# Patient Record
Sex: Female | Born: 1940 | Race: White | Hispanic: No | Smoking: Former smoker
Health system: Southern US, Community
[De-identification: ages and names within clinical notes are randomized; demographics above are authoritative.]

## PROBLEM LIST (undated history)

## (undated) DIAGNOSIS — E079 Disorder of thyroid, unspecified: Secondary | ICD-10-CM

## (undated) DIAGNOSIS — M810 Age-related osteoporosis without current pathological fracture: Secondary | ICD-10-CM

## (undated) DIAGNOSIS — L03319 Cellulitis of trunk, unspecified: Secondary | ICD-10-CM

## (undated) DIAGNOSIS — M1990 Unspecified osteoarthritis, unspecified site: Secondary | ICD-10-CM

## (undated) DIAGNOSIS — L02219 Cutaneous abscess of trunk, unspecified: Secondary | ICD-10-CM

## (undated) DIAGNOSIS — Z853 Personal history of malignant neoplasm of breast: Secondary | ICD-10-CM

## (undated) DIAGNOSIS — E119 Type 2 diabetes mellitus without complications: Secondary | ICD-10-CM

## (undated) DIAGNOSIS — C50919 Malignant neoplasm of unspecified site of unspecified female breast: Secondary | ICD-10-CM

## (undated) DIAGNOSIS — J449 Chronic obstructive pulmonary disease, unspecified: Secondary | ICD-10-CM

## (undated) DIAGNOSIS — C50419 Malignant neoplasm of upper-outer quadrant of unspecified female breast: Secondary | ICD-10-CM

## (undated) DIAGNOSIS — IMO0001 Reserved for inherently not codable concepts without codable children: Secondary | ICD-10-CM

## (undated) DIAGNOSIS — I10 Essential (primary) hypertension: Secondary | ICD-10-CM

## (undated) DIAGNOSIS — N189 Chronic kidney disease, unspecified: Secondary | ICD-10-CM

## (undated) DIAGNOSIS — C3410 Malignant neoplasm of upper lobe, unspecified bronchus or lung: Secondary | ICD-10-CM

## (undated) HISTORY — DX: Type 2 diabetes mellitus without complications: E11.9

## (undated) HISTORY — DX: Unspecified osteoarthritis, unspecified site: M19.90

## (undated) HISTORY — DX: Cutaneous abscess of trunk, unspecified: L02.219

## (undated) HISTORY — DX: Malignant neoplasm of upper-outer quadrant of unspecified female breast: C50.419

## (undated) HISTORY — DX: Essential (primary) hypertension: I10

## (undated) HISTORY — DX: Age-related osteoporosis without current pathological fracture: M81.0

## (undated) HISTORY — PX: ABDOMINAL HYSTERECTOMY: SHX81

## (undated) HISTORY — DX: Personal history of malignant neoplasm of breast: Z85.3

## (undated) HISTORY — DX: Malignant neoplasm of upper lobe, unspecified bronchus or lung: C34.10

## (undated) HISTORY — PX: APPENDECTOMY: SHX54

## (undated) HISTORY — DX: Cellulitis of trunk, unspecified: L03.319

## (undated) HISTORY — DX: Disorder of thyroid, unspecified: E07.9

---

## 2004-04-27 ENCOUNTER — Ambulatory Visit: Payer: Self-pay | Admitting: Internal Medicine

## 2004-04-29 ENCOUNTER — Ambulatory Visit: Payer: Self-pay | Admitting: Internal Medicine

## 2004-05-30 ENCOUNTER — Ambulatory Visit: Payer: Self-pay | Admitting: Internal Medicine

## 2004-06-29 ENCOUNTER — Ambulatory Visit: Payer: Self-pay | Admitting: Internal Medicine

## 2004-09-07 ENCOUNTER — Ambulatory Visit: Payer: Self-pay | Admitting: Internal Medicine

## 2004-10-16 ENCOUNTER — Inpatient Hospital Stay: Payer: Self-pay | Admitting: Internal Medicine

## 2004-10-16 ENCOUNTER — Ambulatory Visit: Payer: Self-pay | Admitting: Internal Medicine

## 2005-02-10 ENCOUNTER — Emergency Department: Payer: Self-pay | Admitting: Emergency Medicine

## 2005-07-08 ENCOUNTER — Inpatient Hospital Stay: Payer: Self-pay | Admitting: Internal Medicine

## 2005-10-21 ENCOUNTER — Ambulatory Visit: Payer: Self-pay | Admitting: Internal Medicine

## 2005-10-26 ENCOUNTER — Ambulatory Visit: Payer: Self-pay | Admitting: Internal Medicine

## 2006-03-01 HISTORY — PX: BREAST CYST ASPIRATION: SHX578

## 2006-04-29 ENCOUNTER — Ambulatory Visit: Payer: Self-pay | Admitting: Internal Medicine

## 2006-05-03 ENCOUNTER — Ambulatory Visit: Payer: Self-pay | Admitting: Internal Medicine

## 2006-11-03 ENCOUNTER — Ambulatory Visit: Payer: Self-pay | Admitting: General Surgery

## 2007-11-29 ENCOUNTER — Ambulatory Visit: Payer: Self-pay | Admitting: Internal Medicine

## 2008-03-01 HISTORY — PX: BREAST BIOPSY: SHX20

## 2008-03-04 ENCOUNTER — Ambulatory Visit: Payer: Self-pay | Admitting: Internal Medicine

## 2008-04-01 ENCOUNTER — Ambulatory Visit: Payer: Self-pay | Admitting: Internal Medicine

## 2008-12-30 ENCOUNTER — Ambulatory Visit: Payer: Self-pay | Admitting: Internal Medicine

## 2009-01-20 ENCOUNTER — Ambulatory Visit: Payer: Self-pay | Admitting: Internal Medicine

## 2009-08-20 ENCOUNTER — Ambulatory Visit: Payer: Self-pay | Admitting: General Surgery

## 2009-12-26 ENCOUNTER — Emergency Department: Payer: Self-pay | Admitting: Emergency Medicine

## 2010-03-01 DIAGNOSIS — C50919 Malignant neoplasm of unspecified site of unspecified female breast: Secondary | ICD-10-CM

## 2010-03-01 HISTORY — PX: OTHER SURGICAL HISTORY: SHX169

## 2010-03-01 HISTORY — PX: BREAST BIOPSY: SHX20

## 2010-03-01 HISTORY — DX: Malignant neoplasm of unspecified site of unspecified female breast: C50.919

## 2010-05-12 ENCOUNTER — Inpatient Hospital Stay: Payer: Self-pay | Admitting: Internal Medicine

## 2010-12-29 ENCOUNTER — Ambulatory Visit: Payer: Self-pay | Admitting: Internal Medicine

## 2010-12-31 ENCOUNTER — Ambulatory Visit: Payer: Self-pay | Admitting: Internal Medicine

## 2011-01-11 HISTORY — PX: BREAST SURGERY: SHX581

## 2011-01-19 ENCOUNTER — Ambulatory Visit: Payer: Self-pay | Admitting: General Surgery

## 2011-01-25 ENCOUNTER — Ambulatory Visit: Payer: Self-pay | Admitting: General Surgery

## 2011-01-25 DIAGNOSIS — C50419 Malignant neoplasm of upper-outer quadrant of unspecified female breast: Secondary | ICD-10-CM

## 2011-01-25 HISTORY — DX: Malignant neoplasm of upper-outer quadrant of unspecified female breast: C50.419

## 2011-01-29 ENCOUNTER — Ambulatory Visit: Payer: Self-pay | Admitting: Radiation Oncology

## 2011-02-08 ENCOUNTER — Ambulatory Visit: Payer: Self-pay | Admitting: Radiation Oncology

## 2011-02-10 LAB — PATHOLOGY REPORT

## 2011-03-02 ENCOUNTER — Ambulatory Visit: Payer: Self-pay | Admitting: Radiation Oncology

## 2011-03-17 ENCOUNTER — Ambulatory Visit: Payer: Self-pay | Admitting: General Surgery

## 2011-03-29 ENCOUNTER — Ambulatory Visit: Payer: Self-pay | Admitting: Radiation Oncology

## 2011-04-02 ENCOUNTER — Ambulatory Visit: Payer: Self-pay | Admitting: Radiation Oncology

## 2011-07-29 ENCOUNTER — Ambulatory Visit: Payer: Self-pay | Admitting: Radiation Oncology

## 2011-07-31 ENCOUNTER — Ambulatory Visit: Payer: Self-pay | Admitting: Radiation Oncology

## 2011-08-12 ENCOUNTER — Ambulatory Visit: Payer: Self-pay | Admitting: General Surgery

## 2011-09-28 ENCOUNTER — Emergency Department: Payer: Self-pay | Admitting: Emergency Medicine

## 2011-09-28 LAB — CBC WITH DIFFERENTIAL/PLATELET
Basophil #: 0 10*3/uL (ref 0.0–0.1)
Eosinophil #: 0.1 10*3/uL (ref 0.0–0.7)
HCT: 42.1 % (ref 35.0–47.0)
Lymphocyte %: 13.6 %
MCH: 28.6 pg (ref 26.0–34.0)
MCHC: 34.6 g/dL (ref 32.0–36.0)
MCV: 83 fL (ref 80–100)
Monocyte #: 0.5 x10 3/mm (ref 0.2–0.9)
Neutrophil #: 7.2 10*3/uL — ABNORMAL HIGH (ref 1.4–6.5)
Platelet: 164 10*3/uL (ref 150–440)
RDW: 13.8 % (ref 11.5–14.5)

## 2011-09-28 LAB — URINALYSIS, COMPLETE
Blood: NEGATIVE
Hyaline Cast: 1
Ketone: NEGATIVE
Nitrite: NEGATIVE
Protein: 100
RBC,UR: <1 /HPF (ref 0–5)
WBC UR: <1 /HPF (ref 0–5)

## 2011-09-28 LAB — COMPREHENSIVE METABOLIC PANEL
Albumin: 2.9 g/dL — ABNORMAL LOW (ref 3.4–5.0)
Alkaline Phosphatase: 96 U/L (ref 50–136)
Anion Gap: 6 — ABNORMAL LOW (ref 7–16)
Bilirubin,Total: 0.2 mg/dL (ref 0.2–1.0)
Calcium, Total: 9.1 mg/dL (ref 8.5–10.1)
Chloride: 108 mmol/L — ABNORMAL HIGH (ref 98–107)
Co2: 28 mmol/L (ref 21–32)
Creatinine: 1.4 mg/dL — ABNORMAL HIGH (ref 0.60–1.30)
Osmolality: 293 (ref 275–301)
Potassium: 4.1 mmol/L (ref 3.5–5.1)
SGOT(AST): 23 U/L (ref 15–37)
SGPT (ALT): 22 U/L
Sodium: 142 mmol/L (ref 136–145)

## 2011-09-28 LAB — CK TOTAL AND CKMB (NOT AT ARMC): CK, Total: 60 U/L (ref 21–215)

## 2012-02-14 ENCOUNTER — Ambulatory Visit: Payer: Self-pay | Admitting: General Surgery

## 2012-03-01 HISTORY — PX: CARDIAC CATHETERIZATION: SHX172

## 2012-03-20 ENCOUNTER — Emergency Department: Payer: Self-pay | Admitting: Emergency Medicine

## 2012-03-20 LAB — COMPREHENSIVE METABOLIC PANEL
Albumin: 2.5 g/dL — ABNORMAL LOW (ref 3.4–5.0)
Alkaline Phosphatase: 103 U/L (ref 50–136)
BUN: 26 mg/dL — ABNORMAL HIGH (ref 7–18)
Calcium, Total: 8.8 mg/dL (ref 8.5–10.1)
Chloride: 108 mmol/L — ABNORMAL HIGH (ref 98–107)
Co2: 24 mmol/L (ref 21–32)
EGFR (African American): 48 — ABNORMAL LOW
EGFR (Non-African Amer.): 42 — ABNORMAL LOW
Osmolality: 284 (ref 275–301)
Potassium: 3.9 mmol/L (ref 3.5–5.1)
SGOT(AST): 20 U/L (ref 15–37)
SGPT (ALT): 20 U/L (ref 12–78)
Sodium: 140 mmol/L (ref 136–145)
Total Protein: 6.7 g/dL (ref 6.4–8.2)

## 2012-03-20 LAB — CBC WITH DIFFERENTIAL/PLATELET
Basophil %: 0.9 %
Eosinophil #: 0.3 10*3/uL (ref 0.0–0.7)
HCT: 37 % (ref 35.0–47.0)
HGB: 12.7 g/dL (ref 12.0–16.0)
Lymphocyte #: 2.2 10*3/uL (ref 1.0–3.6)
Lymphocyte %: 18.2 %
MCH: 29.1 pg (ref 26.0–34.0)
MCHC: 34.2 g/dL (ref 32.0–36.0)
Monocyte #: 0.6 x10 3/mm (ref 0.2–0.9)
Monocyte %: 4.5 %
Neutrophil #: 9 10*3/uL — ABNORMAL HIGH (ref 1.4–6.5)
Platelet: 248 10*3/uL (ref 150–440)
RBC: 4.35 10*6/uL (ref 3.80–5.20)
RDW: 14.4 % (ref 11.5–14.5)

## 2012-03-20 LAB — TROPONIN I: Troponin-I: <0.02 ng/mL

## 2012-03-27 ENCOUNTER — Ambulatory Visit: Payer: Self-pay | Admitting: Internal Medicine

## 2012-04-05 ENCOUNTER — Ambulatory Visit: Payer: Self-pay | Admitting: Hematology and Oncology

## 2012-04-21 LAB — CBC CANCER CENTER
Basophil #: 0.1 x10 3/mm (ref 0.0–0.1)
Basophil %: 0.7 %
Eosinophil #: 0.2 x10 3/mm (ref 0.0–0.7)
Eosinophil %: 2.6 %
HGB: 13.4 g/dL (ref 12.0–16.0)
Lymphocyte #: 1.3 x10 3/mm (ref 1.0–3.6)
Lymphocyte %: 16.5 %
MCH: 29 pg (ref 26.0–34.0)
MCHC: 33.7 g/dL (ref 32.0–36.0)
MCV: 86 fL (ref 80–100)
Monocyte #: 0.6 x10 3/mm (ref 0.2–0.9)
Neutrophil #: 5.8 x10 3/mm (ref 1.4–6.5)
Platelet: 159 x10 3/mm (ref 150–440)
RBC: 4.61 10*6/uL (ref 3.80–5.20)
RDW: 13.6 % (ref 11.5–14.5)
WBC: 7.9 x10 3/mm (ref 3.6–11.0)

## 2012-04-21 LAB — COMPREHENSIVE METABOLIC PANEL
Albumin: 3 g/dL — ABNORMAL LOW (ref 3.4–5.0)
Alkaline Phosphatase: 104 U/L (ref 50–136)
Anion Gap: 10 (ref 7–16)
Bilirubin,Total: 0.2 mg/dL (ref 0.2–1.0)
Calcium, Total: 8.6 mg/dL (ref 8.5–10.1)
Chloride: 107 mmol/L (ref 98–107)
Co2: 26 mmol/L (ref 21–32)
Creatinine: 1.52 mg/dL — ABNORMAL HIGH (ref 0.60–1.30)
EGFR (African American): 39 — ABNORMAL LOW
EGFR (Non-African Amer.): 34 — ABNORMAL LOW
Potassium: 3.7 mmol/L (ref 3.5–5.1)
SGOT(AST): 16 U/L (ref 15–37)
Sodium: 143 mmol/L (ref 136–145)
Total Protein: 6.6 g/dL (ref 6.4–8.2)

## 2012-04-29 ENCOUNTER — Ambulatory Visit: Payer: Self-pay | Admitting: Hematology and Oncology

## 2012-05-03 ENCOUNTER — Ambulatory Visit: Payer: Self-pay | Admitting: Hematology and Oncology

## 2012-05-04 ENCOUNTER — Ambulatory Visit: Payer: Self-pay | Admitting: Hematology and Oncology

## 2012-05-04 LAB — COMPREHENSIVE METABOLIC PANEL
Albumin: 3 g/dL — ABNORMAL LOW (ref 3.4–5.0)
Bilirubin,Total: 0.4 mg/dL (ref 0.2–1.0)
Chloride: 104 mmol/L (ref 98–107)
Creatinine: 1.63 mg/dL — ABNORMAL HIGH (ref 0.60–1.30)
EGFR (African American): 36 — ABNORMAL LOW
Glucose: 115 mg/dL — ABNORMAL HIGH (ref 65–99)
Potassium: 3.7 mmol/L (ref 3.5–5.1)
SGOT(AST): 14 U/L — ABNORMAL LOW (ref 15–37)
SGPT (ALT): 20 U/L (ref 12–78)
Sodium: 143 mmol/L (ref 136–145)
Total Protein: 7.1 g/dL (ref 6.4–8.2)

## 2012-05-04 LAB — CBC CANCER CENTER
Basophil #: 0 x10 3/mm (ref 0.0–0.1)
Basophil %: 0.4 %
Eosinophil %: 1.9 %
HCT: 39.2 % (ref 35.0–47.0)
Lymphocyte %: 15.1 %
MCHC: 33.6 g/dL (ref 32.0–36.0)
Neutrophil #: 6.5 x10 3/mm (ref 1.4–6.5)
RBC: 4.6 10*6/uL (ref 3.80–5.20)
RDW: 13.5 % (ref 11.5–14.5)

## 2012-05-22 ENCOUNTER — Ambulatory Visit: Payer: Self-pay | Admitting: Vascular Surgery

## 2012-05-22 LAB — BASIC METABOLIC PANEL
Anion Gap: 6 — ABNORMAL LOW (ref 7–16)
BUN: 34 mg/dL — ABNORMAL HIGH (ref 7–18)
Calcium, Total: 8.5 mg/dL (ref 8.5–10.1)
Chloride: 108 mmol/L — ABNORMAL HIGH (ref 98–107)
Co2: 26 mmol/L (ref 21–32)
Creatinine: 1.5 mg/dL — ABNORMAL HIGH (ref 0.60–1.30)
EGFR (Non-African Amer.): 34 — ABNORMAL LOW
Glucose: 108 mg/dL — ABNORMAL HIGH (ref 65–99)
Potassium: 3.7 mmol/L (ref 3.5–5.1)
Sodium: 140 mmol/L (ref 136–145)

## 2012-05-30 ENCOUNTER — Ambulatory Visit: Payer: Self-pay | Admitting: Hematology and Oncology

## 2012-06-29 ENCOUNTER — Ambulatory Visit: Payer: Self-pay | Admitting: Hematology and Oncology

## 2012-08-09 ENCOUNTER — Ambulatory Visit: Payer: Self-pay | Admitting: General Surgery

## 2012-08-10 ENCOUNTER — Encounter: Payer: Self-pay | Admitting: General Surgery

## 2012-08-28 ENCOUNTER — Encounter: Payer: Self-pay | Admitting: General Surgery

## 2012-08-28 ENCOUNTER — Ambulatory Visit (INDEPENDENT_AMBULATORY_CARE_PROVIDER_SITE_OTHER): Payer: Medicare Other | Admitting: General Surgery

## 2012-08-28 VITALS — BP 110/60 | HR 68 | Resp 14 | Ht 60.0 in | Wt 150.0 lb

## 2012-08-28 DIAGNOSIS — Z803 Family history of malignant neoplasm of breast: Secondary | ICD-10-CM | POA: Insufficient documentation

## 2012-08-28 DIAGNOSIS — Z853 Personal history of malignant neoplasm of breast: Secondary | ICD-10-CM | POA: Insufficient documentation

## 2012-08-28 NOTE — Patient Instructions (Addendum)
Patient to return in 6 months with a bilateral diagnostic mammogram.

## 2012-08-28 NOTE — Progress Notes (Addendum)
Patient ID: Whitney Wright, female   DOB: 1940/11/30, 72 y.o.   MRN: [id number]  Chief Complaint  Patient presents with  . Follow-up    mammogram    HPI Whitney Wright is a 72 y.o. female who presents for a breast evaluation. The most recent mammogram was done on 08/09/12 with a birad category 2. Patient does not perform regular self breast checks and gets regular mammograms done. She has a personal and family history of breast cancer. The patient denies any new problems with the breasts at this time.     HPI  Past Medical History  Diagnosis Date  . Hypertension   . Cellulitis and abscess of trunk   . Thyroid disease     hypo  . Arthritis   . Personal history of malignant neoplasm of breast   . Diabetes mellitus without complication   . Senile osteoporosis     Past Surgical History  Procedure Laterality Date  . Abdominal hysterectomy  age 69  . Breast surgery    . Mammosite balloon placement Left 2012  . Cardiac catheterization  2014    Family History  Problem Relation Age of Onset  . Breast cancer Daughter   . Breast cancer Mother     Social History History  Substance Use Topics  . Smoking status: Former Smoker -- 1.00 packs/day for 30 years    Types: Cigarettes  . Smokeless tobacco: Never Used  . Alcohol Use: No    Allergies  Allergen Reactions  . Macrodantin (Nitrofurantoin) Rash    Current Outpatient Prescriptions  Medication Sig Dispense Refill  . aspirin 81 MG tablet Take 81 mg by mouth daily.      . hydrALAZINE (APRESOLINE) 25 MG tablet Take 1 tablet by mouth 3 (three) times daily.      . hydrochlorothiazide (HYDRODIURIL) 25 MG tablet Take 1 tablet by mouth daily.      . insulin NPH-regular (NOVOLIN 70/30) (70-30) 100 UNIT/ML injection Inject into the skin as needed.      Marland Kitchen LANTUS SOLOSTAR 100 UNIT/ML SOPN Inject 18 Units into the skin 2 (two) times daily.      Marland Kitchen levothyroxine (SYNTHROID, LEVOTHROID) 150 MCG tablet Take 1 tablet by mouth daily.       Marland Kitchen losartan (COZAAR) 100 MG tablet Take 1 tablet by mouth daily.      Marland Kitchen lovastatin (MEVACOR) 20 MG tablet Take 1 tablet by mouth daily.      . Potassium Gluconate 595 MG CAPS Take 2 capsules by mouth daily.      . tamoxifen (NOLVADEX) 20 MG tablet Take 1 tablet by mouth daily.      . verapamil (CALAN-SR) 120 MG CR tablet Take 2 tablets by mouth daily.       No current facility-administered medications for this visit.    Review of Systems Review of Systems  Constitutional: Negative.   Respiratory: Negative.   Cardiovascular: Negative.     Blood pressure 110/60, pulse 68, resp. rate 14, height 5' (1.524 m), weight 150 lb (68.04 kg).  Physical Exam Physical Exam  Constitutional: She appears well-developed and well-nourished.  Neck: Trachea normal. No mass and no thyromegaly present.  Cardiovascular: Normal rate, regular rhythm and normal heart sounds.   No murmur heard. Pulmonary/Chest: Effort normal and breath sounds normal. Right breast exhibits no inverted nipple, no mass, no nipple discharge, no skin change and no tenderness. Left breast exhibits no inverted nipple, no mass, no nipple discharge, no skin change and  no tenderness.  2 cm Left breast thickening of the scar.   Lymphadenopathy:    She has no cervical adenopathy.    She has no axillary adenopathy.  Neurological: She is alert.  Skin: Skin is warm and dry.    Data Reviewed 08/09/2012 left breast mammograms were reviewed. No interval change. BI-RAD-2.  Assessment    T1a, Nx carcinoma of the left breast.    Plan    We'll plan for a followup examination bilateral mammograms in 6 months.  We discussed the indication for screening colonoscopy based on age as well as her past history of colon cancer. At this time she did first, but will consider the recommendation and give a final answer at her six-month followup exam.       Whitney Wright 08/29/2012, 4:39 PM

## 2012-08-29 ENCOUNTER — Other Ambulatory Visit: Payer: Self-pay | Admitting: General Surgery

## 2012-08-29 ENCOUNTER — Encounter: Payer: Self-pay | Admitting: General Surgery

## 2012-08-30 ENCOUNTER — Other Ambulatory Visit: Payer: Self-pay | Admitting: *Deleted

## 2012-08-30 DIAGNOSIS — Z853 Personal history of malignant neoplasm of breast: Secondary | ICD-10-CM

## 2012-08-30 MED ORDER — TAMOXIFEN CITRATE 20 MG PO TABS: 20.0000 mg | ORAL_TABLET | Freq: Every day | ORAL | Status: DC

## 2012-09-04 ENCOUNTER — Encounter: Payer: Self-pay | Admitting: General Surgery

## 2012-10-27 ENCOUNTER — Ambulatory Visit: Payer: Self-pay | Admitting: Hematology and Oncology

## 2012-10-31 ENCOUNTER — Ambulatory Visit: Payer: Self-pay | Admitting: Hematology and Oncology

## 2012-10-31 LAB — BASIC METABOLIC PANEL
Anion Gap: 7 (ref 7–16)
BUN: 33 mg/dL — ABNORMAL HIGH (ref 7–18)
Calcium, Total: 8.5 mg/dL (ref 8.5–10.1)
Chloride: 104 mmol/L (ref 98–107)
Co2: 28 mmol/L (ref 21–32)
Creatinine: 1.56 mg/dL — ABNORMAL HIGH (ref 0.60–1.30)
EGFR (African American): 38 — ABNORMAL LOW
Glucose: 203 mg/dL — ABNORMAL HIGH (ref 65–99)
Osmolality: 291 (ref 275–301)
Potassium: 4.5 mmol/L (ref 3.5–5.1)
Sodium: 139 mmol/L (ref 136–145)

## 2012-10-31 LAB — CBC CANCER CENTER
Basophil %: 0.4 %
Eosinophil #: 0.2 x10 3/mm (ref 0.0–0.7)
Eosinophil %: 2 %
HCT: 39 % (ref 35.0–47.0)
HGB: 12.9 g/dL (ref 12.0–16.0)
Lymphocyte #: 1.6 x10 3/mm (ref 1.0–3.6)
MCHC: 33.1 g/dL (ref 32.0–36.0)
Monocyte #: 0.6 x10 3/mm (ref 0.2–0.9)
Monocyte %: 7.5 %
Neutrophil #: 5.3 x10 3/mm (ref 1.4–6.5)
Neutrophil %: 68.9 %
Platelet: 169 x10 3/mm (ref 150–440)
RBC: 4.64 10*6/uL (ref 3.80–5.20)
RDW: 14.5 % (ref 11.5–14.5)
WBC: 7.8 x10 3/mm (ref 3.6–11.0)

## 2012-11-29 ENCOUNTER — Ambulatory Visit: Payer: Self-pay | Admitting: Hematology and Oncology

## 2013-02-20 ENCOUNTER — Ambulatory Visit: Payer: Self-pay | Admitting: General Surgery

## 2013-02-26 ENCOUNTER — Encounter: Payer: Self-pay | Admitting: General Surgery

## 2013-03-08 ENCOUNTER — Ambulatory Visit: Payer: Medicare Other | Admitting: General Surgery

## 2013-03-15 ENCOUNTER — Ambulatory Visit: Payer: Self-pay | Admitting: Hematology and Oncology

## 2013-03-15 ENCOUNTER — Ambulatory Visit (INDEPENDENT_AMBULATORY_CARE_PROVIDER_SITE_OTHER): Payer: Medicare HMO | Admitting: General Surgery

## 2013-03-15 ENCOUNTER — Encounter: Payer: Self-pay | Admitting: General Surgery

## 2013-03-15 VITALS — BP 148/68 | HR 72 | Resp 14 | Ht 60.0 in | Wt 154.0 lb

## 2013-03-15 DIAGNOSIS — Z853 Personal history of malignant neoplasm of breast: Secondary | ICD-10-CM

## 2013-03-15 NOTE — Progress Notes (Addendum)
Patient ID: Whitney Wright, female   DOB: 09/28/1940, 73 y.o.   MRN: [id number]  Chief Complaint  Patient presents with  . Follow-up    mammogram    HPI Whitney Wright is a 73 y.o. female.  who presents for her 6 month follow up and breast evaluation. The most recent mammogram was done on 02-20-13. Tolerating Tamoxifen. Patient does perform regular self breast checks and gets regular mammograms done.  No new breast issues. States she just had a CT scan for a nodule that they have been following for about 1-2 years.  HPI  Past Medical History  Diagnosis Date  . Hypertension   . Cellulitis and abscess of trunk   . Thyroid disease     hypo  . Arthritis   . Personal history of malignant neoplasm of breast   . Diabetes mellitus without complication   . Senile osteoporosis   . 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 ER 50%, PR zero,, HER-2/neu nonamplified.    Past Surgical History  Procedure Laterality Date  . Abdominal hysterectomy  age 77  . Breast surgery    . Mammosite balloon placement Left 2012  . Cardiac catheterization  2014    Family History  Problem Relation Age of Onset  . Breast cancer Daughter   . Breast cancer Mother     Social History History  Substance Use Topics  . Smoking status: Former Smoker -- 1.00 packs/day for 30 years    Types: Cigarettes  . Smokeless tobacco: Never Used  . Alcohol Use: No    Allergies  Allergen Reactions  . Macrodantin [Nitrofurantoin] Rash    Current Outpatient Prescriptions  Medication Sig Dispense Refill  . aspirin 81 MG tablet Take 81 mg by mouth daily.      . hydrALAZINE (APRESOLINE) 25 MG tablet Take 1 tablet by mouth 3 (three) times daily.      . hydrochlorothiazide (HYDRODIURIL) 25 MG tablet Take 1 tablet by mouth daily.      . insulin NPH-regular (NOVOLIN 70/30) (70-30) 100 UNIT/ML injection Inject  into the skin as needed.      Marland Kitchen LANTUS SOLOSTAR 100 UNIT/ML SOPN Inject 18 Units into the skin 2 (two) times daily.      Marland Kitchen levothyroxine (SYNTHROID, LEVOTHROID) 150 MCG tablet Take 1 tablet by mouth daily.      Marland Kitchen losartan (COZAAR) 100 MG tablet Take 1 tablet by mouth daily.      Marland Kitchen lovastatin (MEVACOR) 20 MG tablet Take 1 tablet by mouth daily.      . Potassium Gluconate 595 MG CAPS Take 2 capsules by mouth daily.      . tamoxifen (NOLVADEX) 20 MG tablet Take 1 tablet (20 mg total) by mouth daily.  90 tablet  4  . verapamil (CALAN-SR) 120 MG CR tablet Take 2 tablets by mouth daily.       No current facility-administered medications for this visit.    Review of Systems Review of Systems  Constitutional: Negative.   Respiratory: Negative.   Cardiovascular: Negative.     Blood pressure 148/68, pulse 72, resp. rate 14, height 5' (1.524 m), weight 154 lb (69.854 kg).  Physical Exam Physical Exam  Constitutional: She is oriented to person, place, and time. She appears well-developed and well-nourished.  Eyes: No scleral icterus.  Neck: Neck supple.  Cardiovascular: Normal rate, regular rhythm and normal heart sounds.  Pulmonary/Chest: Effort normal and breath sounds normal. Right breast exhibits no inverted nipple, no mass, no nipple discharge, no skin change and no tenderness. Left breast exhibits no inverted nipple, no mass, no nipple discharge, no skin change and no tenderness.    1-2 cm area of calcified area lateral incison left breast   Lymphadenopathy:    She has no cervical adenopathy.    She has no axillary adenopathy.  Neurological: She is alert and oriented to person, place, and time.  Skin: Skin is warm and dry.    Data Reviewed Bilateral mammograms dated February 20, 2013 showed no areas of concern. BI-RAD-2.  CT of the chest completed earlier today reports a slight increase in size of the left upper lobe lesion.  Assessment    Doing well status post treatment of a  5 mm invasive carcinoma of the left breast with extensive intraductal component.     Plan    The patient will followup with Dr. Kallie Edward as plan in regards to her recently completed CT. Will plan for a followup examination here with bilateral mammograms in one year.   The patient will continue on Tamoxifen, 20 mg daily, which she is tolerating well. This was chosen as she showed severe osteoporosis on bone density testing in January 2013.  At that time she was on 12 other medications, and the idea of adding another to offset the effects of Femara was unappealing.         Robert Bellow 03/15/2013, 10:04 PM

## 2013-03-15 NOTE — Patient Instructions (Addendum)
Continue self breast exams. Call office for any new breast issues or concerns. Bilateral diagnostic mammogram and office visit in 1 year

## 2013-03-16 ENCOUNTER — Ambulatory Visit: Payer: Self-pay | Admitting: Hematology and Oncology

## 2013-03-16 ENCOUNTER — Encounter: Payer: Self-pay | Admitting: General Surgery

## 2013-03-16 LAB — CBC CANCER CENTER
BASOS ABS: 0.1 x10 3/mm (ref 0.0–0.1)
Basophil %: 0.6 %
EOS PCT: 1.4 %
Eosinophil #: 0.1 x10 3/mm (ref 0.0–0.7)
HCT: 39.4 % (ref 35.0–47.0)
HGB: 12.9 g/dL (ref 12.0–16.0)
Lymphocyte #: 1.4 x10 3/mm (ref 1.0–3.6)
Lymphocyte %: 14.1 %
MCH: 28.5 pg (ref 26.0–34.0)
MCHC: 32.6 g/dL (ref 32.0–36.0)
MCV: 87 fL (ref 80–100)
MONO ABS: 0.7 x10 3/mm (ref 0.2–0.9)
Monocyte %: 6.6 %
Neutrophil #: 7.7 x10 3/mm — ABNORMAL HIGH (ref 1.4–6.5)
Neutrophil %: 77.3 %
Platelet: 145 x10 3/mm — ABNORMAL LOW (ref 150–440)
RBC: 4.52 10*6/uL (ref 3.80–5.20)
RDW: 14.2 % (ref 11.5–14.5)
WBC: 10 x10 3/mm (ref 3.6–11.0)

## 2013-03-16 LAB — BASIC METABOLIC PANEL
Anion Gap: 11 (ref 7–16)
BUN: 38 mg/dL — ABNORMAL HIGH (ref 7–18)
CREATININE: 1.74 mg/dL — AB (ref 0.60–1.30)
Calcium, Total: 8.2 mg/dL — ABNORMAL LOW (ref 8.5–10.1)
Chloride: 103 mmol/L (ref 98–107)
Co2: 27 mmol/L (ref 21–32)
EGFR (African American): 33 — ABNORMAL LOW
EGFR (Non-African Amer.): 28 — ABNORMAL LOW
Glucose: 219 mg/dL — ABNORMAL HIGH (ref 65–99)
OSMOLALITY: 297 (ref 275–301)
Potassium: 3.9 mmol/L (ref 3.5–5.1)
SODIUM: 141 mmol/L (ref 136–145)

## 2013-03-21 ENCOUNTER — Ambulatory Visit: Payer: Self-pay | Admitting: Oncology

## 2013-03-26 ENCOUNTER — Ambulatory Visit: Payer: Self-pay | Admitting: Cardiothoracic Surgery

## 2013-03-26 LAB — CBC WITH DIFFERENTIAL/PLATELET
Basophil #: 0.1 10*3/uL (ref 0.0–0.1)
Basophil %: 0.5 %
Eosinophil #: 0.2 10*3/uL (ref 0.0–0.7)
Eosinophil %: 1.4 %
HCT: 39.3 % (ref 35.0–47.0)
HGB: 12.8 g/dL (ref 12.0–16.0)
Lymphocyte #: 1.1 10*3/uL (ref 1.0–3.6)
Lymphocyte %: 9.6 %
MCH: 28.7 pg (ref 26.0–34.0)
MCHC: 32.6 g/dL (ref 32.0–36.0)
MCV: 88 fL (ref 80–100)
Monocyte #: 0.7 x10 3/mm (ref 0.2–0.9)
Monocyte %: 5.9 %
Neutrophil #: 9.7 10*3/uL — ABNORMAL HIGH (ref 1.4–6.5)
Neutrophil %: 82.6 %
Platelet: 150 10*3/uL (ref 150–440)
RBC: 4.47 10*6/uL (ref 3.80–5.20)
RDW: 14.2 % (ref 11.5–14.5)
WBC: 11.8 10*3/uL — ABNORMAL HIGH (ref 3.6–11.0)

## 2013-03-26 LAB — PROTIME-INR
INR: 0.9
PROTHROMBIN TIME: 12.2 s (ref 11.5–14.7)

## 2013-03-26 LAB — APTT: Activated PTT: 24.7 secs (ref 23.6–35.9)

## 2013-03-27 LAB — PATHOLOGY REPORT

## 2013-04-01 ENCOUNTER — Ambulatory Visit: Payer: Self-pay | Admitting: Hematology and Oncology

## 2013-04-01 DIAGNOSIS — C3410 Malignant neoplasm of upper lobe, unspecified bronchus or lung: Secondary | ICD-10-CM

## 2013-04-01 HISTORY — DX: Malignant neoplasm of upper lobe, unspecified bronchus or lung: C34.10

## 2013-04-02 ENCOUNTER — Encounter: Payer: Self-pay | Admitting: General Surgery

## 2013-04-29 ENCOUNTER — Ambulatory Visit: Payer: Self-pay | Admitting: Hematology and Oncology

## 2013-05-30 ENCOUNTER — Ambulatory Visit: Payer: Self-pay | Admitting: Hematology and Oncology

## 2013-09-24 ENCOUNTER — Ambulatory Visit: Payer: Self-pay | Admitting: Radiation Oncology

## 2013-10-01 ENCOUNTER — Encounter: Payer: Self-pay | Admitting: General Surgery

## 2013-10-01 ENCOUNTER — Other Ambulatory Visit: Payer: Self-pay | Admitting: General Surgery

## 2013-10-01 ENCOUNTER — Ambulatory Visit: Payer: Self-pay | Admitting: Hematology and Oncology

## 2013-10-03 LAB — CBC CANCER CENTER
BASOS PCT: 0.7 %
Basophil #: 0.1 x10 3/mm (ref 0.0–0.1)
EOS PCT: 2.2 %
Eosinophil #: 0.2 x10 3/mm (ref 0.0–0.7)
HCT: 37.7 % (ref 35.0–47.0)
HGB: 12 g/dL (ref 12.0–16.0)
Lymphocyte #: 1.3 x10 3/mm (ref 1.0–3.6)
Lymphocyte %: 16.6 %
MCH: 27.3 pg (ref 26.0–34.0)
MCHC: 31.7 g/dL — ABNORMAL LOW (ref 32.0–36.0)
MCV: 86 fL (ref 80–100)
MONOS PCT: 7.6 %
Monocyte #: 0.6 x10 3/mm (ref 0.2–0.9)
NEUTROS PCT: 72.9 %
Neutrophil #: 5.6 x10 3/mm (ref 1.4–6.5)
Platelet: 159 x10 3/mm (ref 150–440)
RBC: 4.38 10*6/uL (ref 3.80–5.20)
RDW: 14.1 % (ref 11.5–14.5)
WBC: 7.7 x10 3/mm (ref 3.6–11.0)

## 2013-10-03 LAB — COMPREHENSIVE METABOLIC PANEL
ALBUMIN: 2.8 g/dL — AB (ref 3.4–5.0)
ALK PHOS: 77 U/L
ANION GAP: 5 — AB (ref 7–16)
BILIRUBIN TOTAL: 0.3 mg/dL (ref 0.2–1.0)
BUN: 44 mg/dL — ABNORMAL HIGH (ref 7–18)
CALCIUM: 8.5 mg/dL (ref 8.5–10.1)
CO2: 27 mmol/L (ref 21–32)
CREATININE: 1.73 mg/dL — AB (ref 0.60–1.30)
Chloride: 106 mmol/L (ref 98–107)
EGFR (Non-African Amer.): 28 — ABNORMAL LOW
GFR CALC AF AMER: 33 — AB
GLUCOSE: 157 mg/dL — AB (ref 65–99)
OSMOLALITY: 290 (ref 275–301)
POTASSIUM: 4.3 mmol/L (ref 3.5–5.1)
SGOT(AST): 20 U/L (ref 15–37)
SGPT (ALT): 24 U/L
Sodium: 138 mmol/L (ref 136–145)
Total Protein: 6.7 g/dL (ref 6.4–8.2)

## 2013-10-08 ENCOUNTER — Emergency Department: Payer: Self-pay | Admitting: Emergency Medicine

## 2013-10-30 ENCOUNTER — Ambulatory Visit: Payer: Self-pay | Admitting: Hematology and Oncology

## 2013-12-27 ENCOUNTER — Other Ambulatory Visit: Payer: Self-pay | Admitting: General Surgery

## 2013-12-28 ENCOUNTER — Ambulatory Visit: Payer: Self-pay | Admitting: Internal Medicine

## 2013-12-28 LAB — COMPREHENSIVE METABOLIC PANEL
AST: 16 U/L (ref 15–37)
Albumin: 3 g/dL — ABNORMAL LOW (ref 3.4–5.0)
Alkaline Phosphatase: 95 U/L
Anion Gap: 11 (ref 7–16)
BUN: 53 mg/dL — AB (ref 7–18)
Bilirubin,Total: 0.2 mg/dL (ref 0.2–1.0)
CHLORIDE: 104 mmol/L (ref 98–107)
CREATININE: 2.08 mg/dL — AB (ref 0.60–1.30)
Calcium, Total: 8.1 mg/dL — ABNORMAL LOW (ref 8.5–10.1)
Co2: 26 mmol/L (ref 21–32)
GFR CALC AF AMER: 30 — AB
GFR CALC NON AF AMER: 25 — AB
GLUCOSE: 146 mg/dL — AB (ref 65–99)
Osmolality: 298 (ref 275–301)
Potassium: 3.8 mmol/L (ref 3.5–5.1)
SGPT (ALT): 29 U/L
SODIUM: 141 mmol/L (ref 136–145)
Total Protein: 6.2 g/dL — ABNORMAL LOW (ref 6.4–8.2)

## 2013-12-28 LAB — CBC CANCER CENTER
BASOS ABS: 0.1 x10 3/mm (ref 0.0–0.1)
Basophil %: 0.5 %
EOS ABS: 0.1 x10 3/mm (ref 0.0–0.7)
Eosinophil %: 1 %
HCT: 37.9 % (ref 35.0–47.0)
HGB: 12 g/dL (ref 12.0–16.0)
Lymphocyte #: 1.5 x10 3/mm (ref 1.0–3.6)
Lymphocyte %: 11.5 %
MCH: 27.4 pg (ref 26.0–34.0)
MCHC: 31.5 g/dL — AB (ref 32.0–36.0)
MCV: 87 fL (ref 80–100)
MONO ABS: 0.7 x10 3/mm (ref 0.2–0.9)
MONOS PCT: 5.4 %
Neutrophil #: 10.6 x10 3/mm — ABNORMAL HIGH (ref 1.4–6.5)
Neutrophil %: 81.6 %
Platelet: 173 x10 3/mm (ref 150–440)
RBC: 4.37 10*6/uL (ref 3.80–5.20)
RDW: 15.3 % — ABNORMAL HIGH (ref 11.5–14.5)
WBC: 13 x10 3/mm — AB (ref 3.6–11.0)

## 2013-12-30 ENCOUNTER — Ambulatory Visit: Payer: Self-pay | Admitting: Internal Medicine

## 2013-12-31 ENCOUNTER — Encounter: Payer: Self-pay | Admitting: General Surgery

## 2014-01-15 ENCOUNTER — Ambulatory Visit: Payer: Self-pay | Admitting: Internal Medicine

## 2014-01-29 ENCOUNTER — Ambulatory Visit: Payer: Self-pay | Admitting: Internal Medicine

## 2014-01-30 ENCOUNTER — Ambulatory Visit: Payer: Self-pay | Admitting: Internal Medicine

## 2014-01-30 LAB — CBC WITH DIFFERENTIAL/PLATELET
Basophil #: 0.1 10*3/uL (ref 0.0–0.1)
Basophil %: 0.5 %
EOS PCT: 2 %
Eosinophil #: 0.3 10*3/uL (ref 0.0–0.7)
HCT: 38.1 % (ref 35.0–47.0)
HGB: 12 g/dL (ref 12.0–16.0)
Lymphocyte #: 0.9 10*3/uL — ABNORMAL LOW (ref 1.0–3.6)
Lymphocyte %: 6.5 %
MCH: 27.1 pg (ref 26.0–34.0)
MCHC: 31.5 g/dL — ABNORMAL LOW (ref 32.0–36.0)
MCV: 86 fL (ref 80–100)
MONOS PCT: 5.7 %
Monocyte #: 0.8 x10 3/mm (ref 0.2–0.9)
Neutrophil #: 11.4 10*3/uL — ABNORMAL HIGH (ref 1.4–6.5)
Neutrophil %: 85.3 %
PLATELETS: 196 10*3/uL (ref 150–440)
RBC: 4.42 10*6/uL (ref 3.80–5.20)
RDW: 15.2 % — AB (ref 11.5–14.5)
WBC: 13.4 10*3/uL — ABNORMAL HIGH (ref 3.6–11.0)

## 2014-01-30 LAB — PROTIME-INR
INR: 0.8
Prothrombin Time: 11.4 secs — ABNORMAL LOW (ref 11.5–14.7)

## 2014-03-01 ENCOUNTER — Ambulatory Visit: Payer: Self-pay | Admitting: Internal Medicine

## 2014-03-07 ENCOUNTER — Ambulatory Visit: Payer: Self-pay | Admitting: General Surgery

## 2014-03-08 ENCOUNTER — Encounter: Payer: Self-pay | Admitting: General Surgery

## 2014-03-12 ENCOUNTER — Ambulatory Visit (INDEPENDENT_AMBULATORY_CARE_PROVIDER_SITE_OTHER): Payer: Commercial Managed Care - HMO | Admitting: General Surgery

## 2014-03-12 ENCOUNTER — Encounter: Payer: Self-pay | Admitting: General Surgery

## 2014-03-12 VITALS — BP 140/72 | HR 72 | Resp 16 | Ht 60.0 in | Wt 150.0 lb

## 2014-03-12 DIAGNOSIS — C50912 Malignant neoplasm of unspecified site of left female breast: Secondary | ICD-10-CM

## 2014-03-12 NOTE — Progress Notes (Signed)
Patient ID: Whitney Wright, female   DOB: 04/24/1940, 73 y.o.   MRN: [id number]  Chief Complaint  Patient presents with  . Follow-up    mammogram     HPI Whitney Wright is a 73 y.o. female who presents for a breast evaluation. The most recent mammogram was done on 03/07/14. Patient does perform regular self breast checks and gets regular mammograms done. The patient denies any new breast problems at this time.    HPI  Past Medical History  Diagnosis Date  . Hypertension   . Cellulitis and abscess of trunk   . Thyroid disease     hypo  . Arthritis   . Personal history of malignant neoplasm of breast   . Diabetes mellitus without complication   . Senile osteoporosis   . Malignant neoplasm of upper-outer quadrant of female breast January 25, 2011    Extensive intraductal carcinoma with focal ( 5mm) invasive cancer, T1a, N0, M0. ER 50%, PR 0, HER-2/neu nonamplified.. Tamoxifen chosen as bone density showed severe osteoporosis.  . Lung cancer, upper lobe February 2015    Adenocarcinoma with lepidic pattern, second primary fall 2015    Past Surgical History  Procedure Laterality Date  . Abdominal hysterectomy  age 24  . Mammosite balloon placement Left 2012  . Cardiac catheterization  2014  . Breast surgery Left January 11, 2011    left breast stereotactic biopsy: DCIS    Family History  Problem Relation Age of Onset  . Breast cancer Daughter   . Breast cancer Mother     Social History History  Substance Use Topics  . Smoking status: Former Smoker -- 1.00 packs/day for 30 years    Types: Cigarettes  . Smokeless tobacco: Never Used  . Alcohol Use: No    Allergies  Allergen Reactions  . Macrodantin [Nitrofurantoin] Rash    Current Outpatient Prescriptions  Medication Sig Dispense Refill  . ADVAIR DISKUS 250-50 MCG/DOSE AEPB Inhale 1 puff into the lungs 2 (two) times daily.   5  . albuterol (PROVENTIL HFA;VENTOLIN HFA) 108 (90 BASE) MCG/ACT inhaler  Inhale into the lungs every 6 (six) hours as needed for wheezing or shortness of breath.    . ALPRAZolam (XANAX) 0.25 MG tablet Take 0.25 mg by mouth at bedtime as needed.   3  . aspirin 81 MG tablet Take 81 mg by mouth daily.    . calcium carbonate (OS-CAL) 600 MG TABS tablet Take 600 mg by mouth 2 (two) times daily with a meal.    . Cholecalciferol 1000 UNITS TBDP Take by mouth daily.    . hydrALAZINE (APRESOLINE) 50 MG tablet Take 50 mg by mouth daily.   0  . insulin regular (NOVOLIN R,HUMULIN R) 100 units/mL injection Inject into the skin as needed for high blood sugar.    . ipratropium-albuterol (DUONEB) 0.5-2.5 (3) MG/3ML SOLN   5  . LANTUS SOLOSTAR 100 UNIT/ML SOPN Inject 18 Units into the skin 2 (two) times daily.    . levothyroxine (SYNTHROID, LEVOTHROID) 100 MCG tablet Take 100 mcg by mouth daily before breakfast.     . levothyroxine (SYNTHROID, LEVOTHROID) 88 MCG tablet Take 88 mcg by mouth daily before breakfast.     . losartan (COZAAR) 100 MG tablet Take 1 tablet by mouth daily.    . lovastatin (MEVACOR) 20 MG tablet Take 1 tablet by mouth daily.    . oxyCODONE (OXY IR/ROXICODONE) 5 MG immediate release tablet Take 5 mg by mouth every 6 (six)   hours as needed.   0  . tamoxifen (NOLVADEX) 20 MG tablet Take 20 mg by mouth daily.    Marland Kitchen tiotropium (SPIRIVA) 18 MCG inhalation capsule Place 18 mcg into inhaler and inhale daily.    . verapamil (CALAN-SR) 120 MG CR tablet Take 2 tablets by mouth daily.    . hydrochlorothiazide (HYDRODIURIL) 25 MG tablet Take 1 tablet by mouth daily.     No current facility-administered medications for this visit.    Review of Systems Review of Systems  Constitutional: Negative.   Respiratory: Negative.   Cardiovascular: Negative.     Blood pressure 140/72, pulse 72, resp. rate 16, height 5' (1.524 m), weight 150 lb (68.04 kg).  Physical Exam Physical Exam  Constitutional: She is oriented to person, place, and time. She appears well-developed and  well-nourished.  Neck: Neck supple. No thyromegaly present.  Cardiovascular: Normal rate, regular rhythm and normal heart sounds.   No murmur heard. Pulmonary/Chest: Effort normal and breath sounds normal. Right breast exhibits no inverted nipple, no mass, no nipple discharge, no skin change and no tenderness. Left breast exhibits no inverted nipple, no mass, no nipple discharge, no skin change and no tenderness.    Left breast well healed scar and 2 cm lateral edge of excision of calcifications unchanged from before.   Lymphadenopathy:    She has no cervical adenopathy.    She has no axillary adenopathy.  Neurological: She is alert and oriented to person, place, and time.  Skin: Skin is warm and dry.    Data Reviewed Bilateral mammograms dated 03/07/2014 (toe most synthesis) were reviewed. The density clinically evident on exam shows rare calcifications.  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 She subsequently developed a recurrent adenocarcinoma and has just recently completed radiation for that lesion.    Assessment    No evidence of recurrent left breast cancer.  Recent treatment for 2 primary lung cancers on the left side.  Improved pulmonary performance of since Spiriva therapy initiated     Plan    We'll plan for follow-up examination in one year.     PCP:  Marcine Matar 03/13/2014, 7:17 AM

## 2014-03-12 NOTE — Patient Instructions (Signed)
Patient to return with Bilateral diagnostic mammogram in 1 year. Continue self breast exams. Call office for any new breast issues or concerns.

## 2014-03-13 ENCOUNTER — Encounter: Payer: Self-pay | Admitting: General Surgery

## 2014-03-19 ENCOUNTER — Ambulatory Visit: Payer: Medicare HMO | Admitting: General Surgery

## 2014-03-27 LAB — CBC CANCER CENTER
BASOS PCT: 0.4 %
Basophil #: 0 x10 3/mm (ref 0.0–0.1)
EOS ABS: 0.1 x10 3/mm (ref 0.0–0.7)
EOS PCT: 1.2 %
HCT: 36.1 % (ref 35.0–47.0)
HGB: 11.8 g/dL — AB (ref 12.0–16.0)
LYMPHS ABS: 0.7 x10 3/mm — AB (ref 1.0–3.6)
Lymphocyte %: 7.4 %
MCH: 27 pg (ref 26.0–34.0)
MCHC: 32.7 g/dL (ref 32.0–36.0)
MCV: 83 fL (ref 80–100)
Monocyte #: 0.6 x10 3/mm (ref 0.2–0.9)
Monocyte %: 6.5 %
Neutrophil #: 7.6 x10 3/mm — ABNORMAL HIGH (ref 1.4–6.5)
Neutrophil %: 84.5 %
PLATELETS: 160 x10 3/mm (ref 150–440)
RBC: 4.37 10*6/uL (ref 3.80–5.20)
RDW: 14.5 % (ref 11.5–14.5)
WBC: 9.1 x10 3/mm (ref 3.6–11.0)

## 2014-03-27 LAB — HEPATIC FUNCTION PANEL A (ARMC)
ALT: 23 U/L (ref 14–63)
AST: 16 U/L (ref 15–37)
Albumin: 2.6 g/dL — ABNORMAL LOW (ref 3.4–5.0)
Alkaline Phosphatase: 87 U/L (ref 46–116)
Bilirubin, Direct: <0.05 mg/dL (ref 0.0–0.2)
Bilirubin,Total: 0.2 mg/dL (ref 0.2–1.0)
Total Protein: 6.3 g/dL — ABNORMAL LOW (ref 6.4–8.2)

## 2014-03-27 LAB — CREATININE, SERUM
Creatinine: 1.6 mg/dL — ABNORMAL HIGH (ref 0.60–1.30)
GFR CALC AF AMER: 40 — AB
GFR CALC NON AF AMER: 33 — AB

## 2014-04-01 ENCOUNTER — Ambulatory Visit: Payer: Self-pay | Admitting: Internal Medicine

## 2014-06-21 NOTE — Op Note (Signed)
PATIENT NAME:  Whitney Wright, Whitney Wright MR#:  [id number] DATE OF BIRTH:  1940/06/05  DATE OF PROCEDURE:  05/22/2012  PREOPERATIVE DIAGNOSES:  1.  Carotid and subclavian/vertebral disease, multivessel.  2.  Dizziness.  3.  Chronic obstructive pulmonary disease.  4.  Hypertension.  POSTOPERATIVE DIAGNOSES: 1.  Carotid and subclavian/vertebral disease, multivessel.  2.  Dizziness.  3.  Chronic obstructive pulmonary disease.  4.  Hypertension.   PROCEDURE: 1.  Ultrasound guidance for vascular access, right femoral artery.  2.  Catheter placement into right vertebral artery from right femoral approach.  3.  Catheter placement into right common carotid artery from right femoral approach.  4.  Catheter placement into the left common carotid artery from right femoral approach.  5.  Catheter placement into left subclavian artery from right femoral approach.  6.  Four-vessel cervical and cerebral carotid vertebral angiogram.  7.  StarClose closure device, right femoral artery  SURGEON: Leotis Pain, MD.   ANESTHESIA: Local with moderate conscious sedation.   ESTIMATED BLOOD LOSS: Minimal.   FLUOROSCOPY TIME: 6 minutes.   CONTRAST USED: 70 mL.   INDICATION FOR PROCEDURE: This is a 74 year old female, who we have followed for carotid disease as an outpatient. Her ultrasound findings suggested at least moderate bilateral carotid artery stenosis and had normal flow in the right vertebral artery as well as a retrograde left vertebral artery. She was having persistent dizziness and other near syncopal symptoms, and we are performing angiography to evaluate how bad her cervical and cerebral blood flow is. Risks and benefits were discussed. Informed consent was obtained.   DESCRIPTION OF PROCEDURE: The patient is brought to the vascular and interventional radiology suite. Groins were shaved and prepped and a sterile surgical field was created. The right femoral artery was visualized with ultrasound and found  to be widely patent. It was then accessed under direct ultrasound guidance without difficulty. With a Seldinger needle, a J-wire and 5-French sheath was placed. Pigtail catheter was placed into the ascending aorta and LAO projection thoracic aortogram was performed. This showed a bovine configuration with common origin of the innominate and left common carotid arteries. 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 The origin of the innominate left carotid appeared widely patent. I then cannulated this common origin and sequentially cannulated the right common carotid artery, the right subclavian artery and advanced into the origin of the right vertebral artery for selective imaging of the right vertebral and then into the left common carotid artery. Imaging demonstrated less than 50% carotid artery stenosis bilaterally in the cervical portion with normal intracerebral flow in both carotid systems. The right subclavian artery was patent without hemodynamically significant stenosis and the right vertebral artery was large and patent without significant stenosis. There was retrograde flow in the left subclavian that could be demonstrated on selective imaging of the left vertebral. There was retrograde flow of the left vertebral out the left subclavian that could be demonstrated on selective imaging with the catheter in the right vertebral artery. There were no stenoses within either vertebral artery. The left subclavian artery origin was then cannulated. 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 At this point, I elected to terminate the procedure. The diagnostic catheter was removed. A JB-2 catheter had been used. I then performed oblique arteriogram of the right femoral artery and StarClose closure device was deployed in the usual  fashion with excellent hemostatic result. The patient  tolerated the procedure well and was taken to the recovery room in stable condition.   ____________________________ Algernon Huxley, MD [email or web address] D: 05/22/2012 10:12:58 ET T: 05/22/2012 10:36:44 ET JOB#: [id number]  cc: Algernon Huxley, MD, <Dictator> Tracie Harrier, MD Algernon Huxley MD ELECTRONICALLY SIGNED 05/24/2012 17:08

## 2014-06-22 NOTE — Consult Note (Signed)
Reason for Visit: This 74 year old Female patient presents to the clinic for initial evaluation of  lung cancer .   Referred by Dr. Grayland Ormond.  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 Tumor was ER positive PR negative HER-2/neu not overexpressed and has been on tamoxifen.she's been followed for left mid lung field peripheral nodule which by CT scan has been increasing in size especially of the past 4 months. PET/CT was performed and area was hypermetabolic. She underwent a fine-needle aspiration with which was positive for adenocarcinoma withlepedic features.she is a long smoking history and has known COPD was declined for surgery by Dr. Faith Rogue based on her COPDpoor poor function tests. She is now seen for radiation oncology consultation.  Past Hx:    Breast Cancer: Nov 2012   Diabetes:    COPD:    htn:    borderline diabetes:    bronchitis:    hx dvt:    Hysterectomy - Total:    right knee surgery:   Past, Family and Social History:  Past Medical History positive   Cardiovascular hypertension   Respiratory bronchitis; COPD   Endocrine diabetes mellitus   Past Surgical History breast cancer as described above, total hysterectomy, right knee surgery   Past Medical History Comments history of DVT   Family History noncontributory   Social History positive   Social History Comments greater than 40-pack-year smoking  history quit smoking many years prior. No EtOH abuse history   Additional Past Medical and Surgical History seen by herself today   Allergies:   Macrodantin: Rash  Home Meds:  Home Medications: Medication Instructions Status  aspirin 81 mg oral tablet 1 tab(s) orally once a day (in the morning)   Active  Lantus Solostar Pen 100 units/mL subcutaneous solution 18 unit(s) subcutaneous 2 times a day Active  lovastatin 20 mg oral tablet 1 tab(s) orally once a day (at bedtime) Active  levothyroxine 150 mcg (0.15 mg) oral tablet 1 tab(s) orally once a day (in the morning) Active  verapamil 120 mg/24 hours oral capsule, extended release 2 cap(s) orally once a day (at bedtime) Active  stool softener 1 tab(s) orally once a day (in the morning) Active  Vitamin D3 2000 intl units oral tablet 1 tab(s) orally once a day (in the morning) Active  potassium gluconate 595 mg oral tablet 1 tab(s) orally once a day (in the morning) Active  alprazolam 0.25 mg oral tablet 0.5 tab(s) orally once a day, As Needed Active  Advair Diskus 250 mcg-50 mcg powder 1 puff(s) inhaled 2 times a day  Active  losartan 100 mg oral tablet 1 tab(s) orally once a day Active  tamoxifen 20 mg oral tablet 1 tab(s) orally once a day Active  acetylcysteine 20% inhalation solution 3 milliliter(s) orally twice yesterday and at 3 am this morning pre med Active  calcium carbonate 600 mg oral tablet 1 tab(s) orally 2 times a day Active  magnesium gluconate 1  tab(s) orally once a day Active  hydrALAZINE 25 mg oral tablet 2 tab(s) orally 2 times a day Active   Review of Systems:  General negative   Performance Status (ECOG) 0   Skin negative   Breast negative   Ophthalmologic negative   ENMT negative   Respiratory and Thorax see HPI   Cardiovascular negative   Gastrointestinal negative   Genitourinary negative   Musculoskeletal negative   Neurological negative   Psychiatric negative   Hematology/Lymphatics  negative   Endocrine negative   Allergic/Immunologic negative   Review of Systems   except for chronic nonproductive cough refuses is obtained from nurses notes  Nursing Notes:  Nursing Vital Signs and Chemo Nursing Nursing Notes: *CC Vital Signs Flowsheet:   29-Jan-15 13:13  Temp Temperature 99  Pulse Pulse 72  Respirations Respirations 18  SBP SBP 198  DBP DBP 98  Pain Scale (0-10)  0  Pulse Oxi  94  Current Weight (kg) (kg) 68.7  Height (cm) centimeters 154  BSA (m2) 1.6   Physical Exam:  General/Skin/HEENT:  General normal   Skin normal   Eyes normal   ENMT normal   Head and Neck normal   Additional PE well-developed elderly female in NAD. Lungs are clear to A&P cardiac examination shows regular rate and rhythm. Abdomen is benign. No cervical or subclavicular adenopathy is appreciated.   Breasts/Resp/CV/GI/GU:  Respiratory and Thorax normal   Cardiovascular normal   Gastrointestinal normal   Genitourinary normal   MS/Neuro/Psych/Lymph:  Musculoskeletal normal   Neurological normal   Lymphatics normal   Other Results:  Radiology Results: LabUnknown:    21-Jan-15 10:41, PET/CT Scan Lung Cancer Diagnosis  PACS Image   Nuclear Med:  PET/CT Scan Lung Cancer Diagnosis   REASON FOR EXAM:    Evaluate enlarging pulmonary nodule seen on prior CT   [facility] Mar 15 2013 Histor...  COMMENTS:       PROCEDURE: PET - PET/CT DX LUNG CA  - Mar 21 2013 10:41AM     ADDENDUM REPORT: 03/26/2013 08:52    ADDENDUM:  Correction to impression in the original report:    1. Hypermetabolic LINGULAR nodule is highly worrisome for low-grade  adenocarcinoma. No hypermetabolic adenopathy. Small left lower lobe  nodule does not show hypermetabolism but is too small for PET  resolution. It is new or enlarging from 10/27/2012, making it highly  worrisome for primary bronchogenic carcinoma.      Electronically Signed    By: Lorin Picket M.D.    On: 03/26/2013  08:52    CLINICAL DATA:  Initial treatment strategy for pulmonary nodule.    EXAM:  NUCLEAR MEDICINE PET SKULL BASE TO THIGH    FASTING BLOOD GLUCOSE:  Value: 105m/dl    TECHNIQUE:  12.4 mCi F-18 FDG was injected intravenously. CT data was obtained  and used for attenuation correction and anatomic localization only.  (This was notacquired as a diagnostic CT examination.) Additional  exam technical data entered on technologist worksheet.    COMPARISON:  CT CHEST W/ CM dated 03/15/2013; CT CHEST W/O CM dated  10/27/2012; CT CHEST W/ CM dated 06/26/2012; CT CHEST W/O CM dated  03/27/2012    FINDINGS:  NECK    No hypermetabolic lymph nodes in the neck. CT images show no acute  findings.  CHEST    A 1.5 x 2.4 cm irregular nodular lesion in the lingula has an SUV  max of 3.2. An 8 mm nodule in the left lower  lobe (CT image 144)  does not show abnormal hypermetabolism but is too small for PET  resolution. Note is made that it appears new or enlarged from  10/27/2012.    No hypermetabolic adenopathy. Mild symmetric hypermetabolism the  thyroid, nonspecific. No additional areas ofabnormal  hypermetabolism in the chest. Note is made of asymmetric muscular  activity in the left paraspinal region. An extrapleural lesion in  the posterior medial apical right hemi thorax measures 1.7 x 3.2 cm  and is stable from baseline examination. It does not show metabolism  greater than adjacent muscles or blood pool.  CT images show atherosclerotic calcification of the arterial  vasculature, including three-vessel involvement of the coronary  arteries. Heart is mildly enlarged. Lipomatous hypertrophy of the  interatrial septum is incidentally noted. No pericardial effusion.    Moderate centrilobular emphysema.  No pleural fluid.    ABDOMEN/PELVIS    Focal hypermetabolism is seen along the lateral wall of the distal  descending colon (CT image 112), without a CT correlate. No  additional areas of  abnormal hypermetabolism in the abdomen or  pelvis.    CT images show the liver, gallbladder, adrenal glands, kidneys,  spleen and pancreas to be grossly unremarkable. Small hiatal hernia.  Stomach and small bowel are grossly unremarkable. Stool is seen in  the majority of the colon, indicative of constipation. No free  fluid. Small bilateral inguinal hernias contain fat. Atherosclerotic  calcification of the arterial vasculature without abdominal aortic  aneurysm.    SKELETON    No focal hypermetabolic activity to suggest skeletal metastasis.  Sclerosis is seen in the right femoral head. Difficult to exclude  avascular necrosis.     IMPRESSION:  1. Hypermetabolic left lower lobe nodule is highly worrisome for  low-grade adenocarcinoma. No hypermetabolic adenopathy. Small left  lower lobe nodule does not show hypermetabolism but is too small for  PET resolution. It is new or enlarging from 10/27/2012, making it  highly worrisome for primary bronchogenic carcinoma.  2. Extrapleural soft tissue lesion in the posterior medial aspect of  the apical right hemi thorax is likely neurogenic in origin.  3. Three-vessel coronary artery calcification.  4. Sclerosis in the right femoral head. Difficult to exclude  avascular necrosis.    Electronically Signed:  By: Lorin Picket M.D.  On: 03/21/2013 12:29       Verified By: Luretha Rued, M.D.,   Relevent Results:   Relevant Scans and Labs PET/CT scan is reviewed.   Assessment and Plan: Impression:   stage I adenocarcinoma of the left lung in 74 year old female with poor lung function and prior history of radiation for left breast cancer. Plan:   this time I failed can't patient would be a good candidate for stereotactic body radiation therapy. Would plan on delivering 5000 cGy in 5 fractions. Risks and benefits of treatment including possible chest wall pain, conclusion of some normal lung volume, possible pneumonitis, fatigue,  or discussed in detail with the patient. I have set her up for CT simulation with the intent to track motion of her tumor during simulation and from that planned arriving a SB RT plan. Patient is comfortable with her decision and has agreed to proceed forward with treatment planning next week.  I would like to take this opportunity to thank you for allowing me to continue to participate in this patient's care.  CC Referral:  cc: Dr. Ginette Pitman, Dr. Raul Del   Electronic Signatures: Armstead Peaks (MD)  (Signed 02-Feb-15 15:55)  Authored: HPI, Diagnosis, Past Hx, PFSH, Allergies, Home Meds, ROS, Nursing Notes, Physical Exam, Other Results, Relevent Results, Encounter Assessment and Plan, CC Referring Physician   Last Updated: 02-Feb-15 15:55 by Armstead Peaks (MD)

## 2014-06-24 ENCOUNTER — Ambulatory Visit
Admit: 2014-06-24 | Disposition: A | Discharge status: Home / Self Care | Payer: Self-pay | Attending: Internal Medicine | Admitting: Internal Medicine

## 2014-06-24 LAB — SURGICAL PATHOLOGY

## 2014-06-25 ENCOUNTER — Ambulatory Visit
Admit: 2014-06-25 | Disposition: A | Discharge status: Home / Self Care | Payer: Self-pay | Attending: Internal Medicine | Admitting: Internal Medicine

## 2014-06-26 LAB — CBC CANCER CENTER
Basophil #: 0 x10 3/mm (ref 0.0–0.1)
Basophil %: 0.5 %
Eosinophil #: 0.2 x10 3/mm (ref 0.0–0.7)
Eosinophil %: 2 %
HCT: 33 % — ABNORMAL LOW (ref 35.0–47.0)
HGB: 10.6 g/dL — ABNORMAL LOW (ref 12.0–16.0)
Lymphocyte #: 0.9 x10 3/mm — ABNORMAL LOW (ref 1.0–3.6)
Lymphocyte %: 10.8 %
MCH: 26 pg (ref 26.0–34.0)
MCHC: 32 g/dL (ref 32.0–36.0)
MCV: 81 fL (ref 80–100)
MONOS PCT: 5.8 %
Monocyte #: 0.5 x10 3/mm (ref 0.2–0.9)
NEUTROS PCT: 80.9 %
Neutrophil #: 6.5 x10 3/mm (ref 1.4–6.5)
PLATELETS: 182 x10 3/mm (ref 150–440)
RBC: 4.06 10*6/uL (ref 3.80–5.20)
RDW: 15.5 % — ABNORMAL HIGH (ref 11.5–14.5)
WBC: 8 x10 3/mm (ref 3.6–11.0)

## 2014-06-26 LAB — CREATININE, SERUM
Creatinine: 1.45 mg/dL — ABNORMAL HIGH
EGFR (Non-African Amer.): 35 — ABNORMAL LOW
GFR CALC AF AMER: 40 — AB

## 2014-06-26 LAB — HEPATIC FUNCTION PANEL A (ARMC)
ALK PHOS: 76 U/L
ALT: 28 U/L
AST: 24 U/L
Albumin: 2.7 g/dL — ABNORMAL LOW
Bilirubin, Direct: <0.1 mg/dL
Bilirubin,Total: 0.3 mg/dL
Total Protein: 6.1 g/dL — ABNORMAL LOW

## 2014-06-27 ENCOUNTER — Other Ambulatory Visit: Payer: Self-pay | Admitting: Internal Medicine

## 2014-06-27 DIAGNOSIS — C3432 Malignant neoplasm of lower lobe, left bronchus or lung: Secondary | ICD-10-CM

## 2014-08-17 ENCOUNTER — Encounter: Payer: Self-pay | Admitting: Emergency Medicine

## 2014-08-17 ENCOUNTER — Other Ambulatory Visit: Payer: Self-pay

## 2014-08-17 ENCOUNTER — Emergency Department: Payer: Commercial Managed Care - HMO

## 2014-08-17 ENCOUNTER — Emergency Department
Admission: EM | Admit: 2014-08-17 | Discharge: 2014-08-17 | Disposition: A | Discharge status: Home / Self Care | Payer: Commercial Managed Care - HMO | Attending: Emergency Medicine | Admitting: Emergency Medicine

## 2014-08-17 DIAGNOSIS — Z87891 Personal history of nicotine dependence: Secondary | ICD-10-CM | POA: Diagnosis not present

## 2014-08-17 DIAGNOSIS — E119 Type 2 diabetes mellitus without complications: Secondary | ICD-10-CM | POA: Diagnosis not present

## 2014-08-17 DIAGNOSIS — I10 Essential (primary) hypertension: Secondary | ICD-10-CM | POA: Insufficient documentation

## 2014-08-17 DIAGNOSIS — C3432 Malignant neoplasm of lower lobe, left bronchus or lung: Secondary | ICD-10-CM | POA: Insufficient documentation

## 2014-08-17 DIAGNOSIS — J449 Chronic obstructive pulmonary disease, unspecified: Secondary | ICD-10-CM

## 2014-08-17 DIAGNOSIS — J441 Chronic obstructive pulmonary disease with (acute) exacerbation: Secondary | ICD-10-CM | POA: Insufficient documentation

## 2014-08-17 DIAGNOSIS — R0602 Shortness of breath: Secondary | ICD-10-CM | POA: Diagnosis present

## 2014-08-17 LAB — COMPREHENSIVE METABOLIC PANEL
ALBUMIN: 2.5 g/dL — AB (ref 3.5–5.0)
ALK PHOS: 83 U/L (ref 38–126)
ALT: 20 U/L (ref 14–54)
AST: 20 U/L (ref 15–41)
Anion gap: 4 — ABNORMAL LOW (ref 5–15)
BUN: 34 mg/dL — ABNORMAL HIGH (ref 6–20)
CALCIUM: 7.9 mg/dL — AB (ref 8.9–10.3)
CO2: 25 mmol/L (ref 22–32)
Chloride: 111 mmol/L (ref 101–111)
Creatinine, Ser: 1.94 mg/dL — ABNORMAL HIGH (ref 0.44–1.00)
GFR calc non Af Amer: 24 mL/min — ABNORMAL LOW (ref >60)
GFR, EST AFRICAN AMERICAN: 28 mL/min — AB (ref >60)
Glucose, Bld: 144 mg/dL — ABNORMAL HIGH (ref 65–99)
POTASSIUM: 4.6 mmol/L (ref 3.5–5.1)
SODIUM: 140 mmol/L (ref 135–145)
TOTAL PROTEIN: 5.9 g/dL — AB (ref 6.5–8.1)
Total Bilirubin: 0.3 mg/dL (ref 0.3–1.2)

## 2014-08-17 LAB — CBC
HEMATOCRIT: 33.1 % — AB (ref 35.0–47.0)
Hemoglobin: 10.6 g/dL — ABNORMAL LOW (ref 12.0–16.0)
MCH: 26.9 pg (ref 26.0–34.0)
MCHC: 32.1 g/dL (ref 32.0–36.0)
MCV: 83.9 fL (ref 80.0–100.0)
Platelets: 168 10*3/uL (ref 150–440)
RBC: 3.94 MIL/uL (ref 3.80–5.20)
RDW: 15.6 % — ABNORMAL HIGH (ref 11.5–14.5)
WBC: 7.8 10*3/uL (ref 3.6–11.0)

## 2014-08-17 LAB — BRAIN NATRIURETIC PEPTIDE: B NATRIURETIC PEPTIDE 5: 390 pg/mL — AB (ref 0.0–100.0)

## 2014-08-17 LAB — TROPONIN I

## 2014-08-17 MED ORDER — IPRATROPIUM-ALBUTEROL 0.5-2.5 (3) MG/3ML IN SOLN
3.0000 mL | Freq: Once | RESPIRATORY_TRACT | Status: AC
  Administered 2014-08-17: 3 mL via RESPIRATORY_TRACT

## 2014-08-17 MED ORDER — METHYLPREDNISOLONE SODIUM SUCC 125 MG IJ SOLR
125.0000 mg | Freq: Once | INTRAMUSCULAR | Status: AC
  Administered 2014-08-17: 125 mg via INTRAVENOUS

## 2014-08-17 MED ORDER — OXYCODONE-ACETAMINOPHEN 5-325 MG PO TABS
2.0000 | ORAL_TABLET | Freq: Once | ORAL | Status: AC
Start: 2014-08-17 — End: 2014-08-17
  Administered 2014-08-17: 2 via ORAL

## 2014-08-17 MED ORDER — METHYLPREDNISOLONE SODIUM SUCC 125 MG IJ SOLR
INTRAMUSCULAR | Status: AC
  Administered 2014-08-17: 125 mg via INTRAVENOUS
  Filled 2014-08-17: qty 2

## 2014-08-17 MED ORDER — OXYCODONE-ACETAMINOPHEN 5-325 MG PO TABS
ORAL_TABLET | ORAL | Status: AC
  Administered 2014-08-17: 2 via ORAL
  Filled 2014-08-17: qty 2

## 2014-08-17 MED ORDER — HYDROCOD POLST-CPM POLST ER 10-8 MG/5ML PO SUER: 5.0000 mL | Freq: Two times a day (BID) | ORAL | Status: DC

## 2014-08-17 MED ORDER — PREDNISONE 10 MG (21) PO TBPK: 10.0000 mg | ORAL_TABLET | Freq: Every day | ORAL | Status: DC

## 2014-08-17 MED ORDER — IPRATROPIUM-ALBUTEROL 0.5-2.5 (3) MG/3ML IN SOLN
RESPIRATORY_TRACT | Status: AC
Start: 2014-08-17 — End: 2014-08-17
  Administered 2014-08-17: 3 mL via RESPIRATORY_TRACT
  Filled 2014-08-17: qty 3

## 2014-08-17 NOTE — ED Notes (Signed)
Increasing SOB and chest pain since yesterday

## 2014-08-17 NOTE — ED Notes (Signed)
Pt informed to return if any life threatening symptoms occur.  

## 2014-08-17 NOTE — ED Provider Notes (Addendum)
[facility] Emergency Department Provider Note     Time seen: ----------------------------------------- 10:19 AM on 08/17/2014 -----------------------------------------    I have reviewed the triage vital signs and the nursing notes.   HISTORY  Chief Complaint Shortness of Breath    HPI Whitney Wright is a 74 y.o. female who presents ER for increased shortness of breath and chest pain since chest. She doesn't history of same, has had pneumonia in the past. Denies fevers chills but has been coughing some. It is mild to moderate, coughing seems to make her symptoms worse.   Past Medical History  Diagnosis Date  . Hypertension   . Cellulitis and abscess of trunk   . Thyroid disease     hypo  . Arthritis   . Personal history of malignant neoplasm of breast   . Diabetes mellitus without complication   . Senile osteoporosis   . Malignant neoplasm of upper-outer quadrant of female breast January 25, 2011    Extensive intraductal carcinoma with focal ( 90m) invasive cancer, T1a, N0, M0. ER 50%, PR 0, HER-2/neu nonamplified.. Tamoxifen chosen as bone density showed severe osteoporosis.  . Lung cancer, upper lobe February 2015    Adenocarcinoma with lepidic pattern, second primary fall 2015    Patient Active Problem List   Diagnosis Date Noted  . Lung cancer, upper lobe 04/01/2013  . Personal history of breast cancer 08/28/2012  . Family history of breast cancer 08/28/2012    Past Surgical History  Procedure Laterality Date  . Abdominal hysterectomy  age 74 . Mammosite balloon placement Left 2012  . Cardiac catheterization  2014  . Breast surgery Left January 11, 2011    left breast stereotactic biopsy: DCIS    Allergies Macrodantin  Social History History  Substance Use Topics  . Smoking status: Former Smoker -- 0.00 packs/day for 30 years  . Smokeless tobacco: Never Used  . Alcohol Use: No    Review of  Systems Constitutional: Negative for fever. Eyes: Negative for visual changes. ENT: Negative for sore throat. Cardiovascular: Positive for chest pain Respiratory: Positive shortness of breath Gastrointestinal: Negative for abdominal pain, vomiting and diarrhea. Genitourinary: Negative for dysuria. Musculoskeletal: Negative for back pain. Skin: Negative for rash. Neurological: Negative for headaches, focal weakness or numbness.  10-point ROS otherwise negative.  ____________________________________________   PHYSICAL EXAM:  VITAL SIGNS: ED Triage Vitals  Enc Vitals Group     BP 08/17/14 0946 153/64 mmHg     Pulse Rate 08/17/14 0946 84     Resp 08/17/14 0946 24     Temp 08/17/14 0946 97.8 F (36.6 C)     Temp src --      SpO2 08/17/14 0946 96 %     Weight 08/17/14 0946 155 lb (70.308 kg)     Height 08/17/14 0946 5' (1.524 m)     Head Cir --      Peak Flow --      Pain Score 08/17/14 1005 9     Pain Loc --      Pain Edu? --      Excl. in [place] --     Constitutional: Alert and oriented. Well appearing and in no distress. Eyes: Conjunctivae are normal. PERRL. Normal extraocular movements. ENT   Head: Normocephalic and atraumatic.   Nose: No congestion/rhinnorhea.   Mouth/Throat: Mucous membranes are moist.   Neck: No stridor. Hematological/Lymphatic/Immunilogical: No cervical lymphadenopathy. Cardiovascular: Normal rate, regular rhythm. Normal and symmetric distal pulses are present in  all extremities. No murmurs, rubs, or gallops. Respiratory: Normal respiratory effort without tachypnea nor retractions. Breath sounds are clear and equal bilaterally. No wheezes/rales/rhonchi. Gastrointestinal: Soft and nontender. No distention. No abdominal bruits. There is no CVA tenderness. Musculoskeletal: Nontender with normal range of motion in all extremities. No joint effusions.  No lower extremity tenderness nor edema. Neurologic:  Normal speech and language. No gross  focal neurologic deficits are appreciated. Speech is normal. No gait instability. Skin:  Skin is warm, dry and intact. No rash noted. Psychiatric: Mood and affect are normal. Speech and behavior are normal. Patient exhibits appropriate insight and judgment. ____________________________________________  EKG: Interpreted by me. Normal sinus rhythm with a rate of 85, normal axis, normal intervals. Nonspecific ST and T-wave changes. No evidence of acute infarction.  ____________________________________________  ED COURSE:  Pertinent labs & imaging results that were available during my care of the patient were reviewed by me and considered in my medical decision making (see chart for details). Patient will receive chest x-ray and reevaluation. ____________________________________________    LABS (pertinent positives/negatives)  Labs Reviewed  CBC - Abnormal; Notable for the following:    Hemoglobin 10.6 (*)    HCT 33.1 (*)    RDW 15.6 (*)    All other components within normal limits  COMPREHENSIVE METABOLIC PANEL - Abnormal; Notable for the following:    Glucose, Bld 144 (*)    BUN 34 (*)    Creatinine, Ser 1.94 (*)    Calcium 7.9 (*)    Total Protein 5.9 (*)    Albumin 2.5 (*)    GFR calc non Af Amer 24 (*)    GFR calc Af Amer 28 (*)    Anion gap 4 (*)    All other components within normal limits  BRAIN NATRIURETIC PEPTIDE - Abnormal; Notable for the following:    B Natriuretic Peptide 390.0 (*)    All other components within normal limits  TROPONIN I    RADIOLOGY  IMPRESSION: Mildly worsened left basilar airspace opacification likely reflects interval progression of the patient's known lung cancer, with underlying postradiation changes. Superimposed pneumonia cannot be excluded. Suspect small left pleural effusion.  IMPRESSION: Slightly increased consolidation or volume loss in the left lower lobe compared to the previous examination. Previously described nodule in  the left lower lobe is not well demonstrated on this examination.  Chronic changes in the lingula.  Centrilobular emphysema.  Stable pleural-based lesion along the posterior right hemithorax likely represents a benign etiology, such as a neurogenic tumor.  ____________________________________________  FINAL ASSESSMENT AND PLAN  Chest pain and dyspnea from lung cancer, possible superimposed pneumonia  Plan: Patient be discharged with pain medication and we'll cover with antibiotics. She is in no acute distress this time. O2 saturation is satisfactory at this time on her normal basal rate of 2 L/m. CT scan only shows slightly increased volume loss and consolidation consistent with her known lung cancer. Stable for outpatient follow-up with oncology. Continue antibiotics as an outpatient as prescribed   Earleen Newport, MD   Earleen Newport, MD 08/17/14 [place], MD 08/17/14 [phone]

## 2014-08-17 NOTE — Discharge Instructions (Signed)

## 2014-08-21 ENCOUNTER — Inpatient Hospital Stay: Payer: Commercial Managed Care - HMO

## 2014-08-21 ENCOUNTER — Telehealth: Payer: Self-pay

## 2014-08-21 ENCOUNTER — Encounter: Payer: Self-pay | Admitting: *Deleted

## 2014-08-21 ENCOUNTER — Inpatient Hospital Stay
Admission: AD | Admit: 2014-08-21 | Discharge: 2014-08-29 | DRG: 193 | Disposition: A | Discharge status: Skilled Nursing Facility | Payer: Commercial Managed Care - HMO | Source: Ambulatory Visit | Attending: Internal Medicine | Admitting: Internal Medicine

## 2014-08-21 DIAGNOSIS — B488 Other specified mycoses: Secondary | ICD-10-CM | POA: Diagnosis present

## 2014-08-21 DIAGNOSIS — Z66 Do not resuscitate: Secondary | ICD-10-CM | POA: Diagnosis present

## 2014-08-21 DIAGNOSIS — Z7982 Long term (current) use of aspirin: Secondary | ICD-10-CM

## 2014-08-21 DIAGNOSIS — J9621 Acute and chronic respiratory failure with hypoxia: Secondary | ICD-10-CM | POA: Diagnosis present

## 2014-08-21 DIAGNOSIS — M1990 Unspecified osteoarthritis, unspecified site: Secondary | ICD-10-CM | POA: Diagnosis present

## 2014-08-21 DIAGNOSIS — M810 Age-related osteoporosis without current pathological fracture: Secondary | ICD-10-CM | POA: Diagnosis present

## 2014-08-21 DIAGNOSIS — C3432 Malignant neoplasm of lower lobe, left bronchus or lung: Secondary | ICD-10-CM | POA: Diagnosis present

## 2014-08-21 DIAGNOSIS — J449 Chronic obstructive pulmonary disease, unspecified: Secondary | ICD-10-CM | POA: Diagnosis present

## 2014-08-21 DIAGNOSIS — Z7951 Long term (current) use of inhaled steroids: Secondary | ICD-10-CM

## 2014-08-21 DIAGNOSIS — E1122 Type 2 diabetes mellitus with diabetic chronic kidney disease: Secondary | ICD-10-CM | POA: Diagnosis present

## 2014-08-21 DIAGNOSIS — J9620 Acute and chronic respiratory failure, unspecified whether with hypoxia or hypercapnia: Secondary | ICD-10-CM | POA: Diagnosis present

## 2014-08-21 DIAGNOSIS — J189 Pneumonia, unspecified organism: Principal | ICD-10-CM | POA: Diagnosis present

## 2014-08-21 DIAGNOSIS — I129 Hypertensive chronic kidney disease with stage 1 through stage 4 chronic kidney disease, or unspecified chronic kidney disease: Secondary | ICD-10-CM | POA: Diagnosis present

## 2014-08-21 DIAGNOSIS — Z803 Family history of malignant neoplasm of breast: Secondary | ICD-10-CM

## 2014-08-21 DIAGNOSIS — R059 Cough, unspecified: Secondary | ICD-10-CM

## 2014-08-21 DIAGNOSIS — N189 Chronic kidney disease, unspecified: Secondary | ICD-10-CM | POA: Diagnosis present

## 2014-08-21 DIAGNOSIS — Z794 Long term (current) use of insulin: Secondary | ICD-10-CM

## 2014-08-21 DIAGNOSIS — Z87891 Personal history of nicotine dependence: Secondary | ICD-10-CM

## 2014-08-21 DIAGNOSIS — R5383 Other fatigue: Secondary | ICD-10-CM | POA: Diagnosis not present

## 2014-08-21 DIAGNOSIS — C3490 Malignant neoplasm of unspecified part of unspecified bronchus or lung: Secondary | ICD-10-CM | POA: Diagnosis not present

## 2014-08-21 DIAGNOSIS — E039 Hypothyroidism, unspecified: Secondary | ICD-10-CM | POA: Diagnosis present

## 2014-08-21 DIAGNOSIS — Z853 Personal history of malignant neoplasm of breast: Secondary | ICD-10-CM

## 2014-08-21 DIAGNOSIS — Z7952 Long term (current) use of systemic steroids: Secondary | ICD-10-CM

## 2014-08-21 DIAGNOSIS — R0602 Shortness of breath: Secondary | ICD-10-CM

## 2014-08-21 DIAGNOSIS — R05 Cough: Secondary | ICD-10-CM

## 2014-08-21 DIAGNOSIS — N179 Acute kidney failure, unspecified: Secondary | ICD-10-CM | POA: Diagnosis present

## 2014-08-21 DIAGNOSIS — B9689 Other specified bacterial agents as the cause of diseases classified elsewhere: Secondary | ICD-10-CM | POA: Diagnosis present

## 2014-08-21 HISTORY — DX: Malignant neoplasm of unspecified site of unspecified female breast: C50.919

## 2014-08-21 LAB — COMPREHENSIVE METABOLIC PANEL
ALT: 25 U/L (ref 14–54)
AST: 21 U/L (ref 15–41)
Albumin: 2.4 g/dL — ABNORMAL LOW (ref 3.5–5.0)
Alkaline Phosphatase: 85 U/L (ref 38–126)
Anion gap: 11 (ref 5–15)
BUN: 58 mg/dL — ABNORMAL HIGH (ref 6–20)
CHLORIDE: 103 mmol/L (ref 101–111)
CO2: 25 mmol/L (ref 22–32)
Calcium: 8.5 mg/dL — ABNORMAL LOW (ref 8.9–10.3)
Creatinine, Ser: 1.96 mg/dL — ABNORMAL HIGH (ref 0.44–1.00)
GFR calc Af Amer: 27 mL/min — ABNORMAL LOW (ref >60)
GFR, EST NON AFRICAN AMERICAN: 24 mL/min — AB (ref >60)
Glucose, Bld: 341 mg/dL — ABNORMAL HIGH (ref 65–99)
POTASSIUM: 4.5 mmol/L (ref 3.5–5.1)
Sodium: 139 mmol/L (ref 135–145)
Total Bilirubin: 0.3 mg/dL (ref 0.3–1.2)
Total Protein: 5.5 g/dL — ABNORMAL LOW (ref 6.5–8.1)

## 2014-08-21 LAB — CBC WITH DIFFERENTIAL/PLATELET
BASOS ABS: 0 10*3/uL (ref 0–0.1)
BASOS PCT: 0 %
Eosinophils Absolute: 0 10*3/uL (ref 0–0.7)
Eosinophils Relative: 0 %
HCT: 35.7 % (ref 35.0–47.0)
Hemoglobin: 11.4 g/dL — ABNORMAL LOW (ref 12.0–16.0)
Lymphocytes Relative: 5 %
Lymphs Abs: 0.7 10*3/uL — ABNORMAL LOW (ref 1.0–3.6)
MCH: 26.9 pg (ref 26.0–34.0)
MCHC: 31.9 g/dL — ABNORMAL LOW (ref 32.0–36.0)
MCV: 84.2 fL (ref 80.0–100.0)
MONO ABS: 0.7 10*3/uL (ref 0.2–0.9)
MONOS PCT: 5 %
NEUTROS ABS: 11.9 10*3/uL — AB (ref 1.4–6.5)
Neutrophils Relative %: 90 %
PLATELETS: 203 10*3/uL (ref 150–440)
RBC: 4.24 MIL/uL (ref 3.80–5.20)
RDW: 15.1 % — ABNORMAL HIGH (ref 11.5–14.5)
WBC: 13.2 10*3/uL — AB (ref 3.6–11.0)

## 2014-08-21 LAB — URINALYSIS COMPLETE WITH MICROSCOPIC (ARMC ONLY)
Bacteria, UA: NONE SEEN
Bilirubin Urine: NEGATIVE
Glucose, UA: >500 mg/dL — AB
Hgb urine dipstick: NEGATIVE
Ketones, ur: NEGATIVE mg/dL
Leukocytes, UA: NEGATIVE
Nitrite: NEGATIVE
Protein, ur: >500 mg/dL — AB
RBC / HPF: NONE SEEN RBC/hpf (ref 0–5)
Specific Gravity, Urine: 1.015 (ref 1.005–1.030)
pH: 6 (ref 5.0–8.0)

## 2014-08-21 LAB — TROPONIN I

## 2014-08-21 LAB — TSH: TSH: 0.611 u[IU]/mL (ref 0.350–4.500)

## 2014-08-21 LAB — GLUCOSE, CAPILLARY
GLUCOSE-CAPILLARY: 291 mg/dL — AB (ref 65–99)
Glucose-Capillary: 380 mg/dL — ABNORMAL HIGH (ref 65–99)

## 2014-08-21 MED ORDER — HYDRALAZINE HCL 25 MG PO TABS
50.0000 mg | ORAL_TABLET | Freq: Every day | ORAL | Status: DC
  Administered 2014-08-22 – 2014-08-25 (×4): 50 mg via ORAL
  Filled 2014-08-21 (×4): qty 2

## 2014-08-21 MED ORDER — METHYLPREDNISOLONE SODIUM SUCC 125 MG IJ SOLR
60.0000 mg | INTRAMUSCULAR | Status: DC
  Administered 2014-08-21 – 2014-08-22 (×2): 60 mg via INTRAVENOUS
  Filled 2014-08-21 (×2): qty 2

## 2014-08-21 MED ORDER — LEVOTHYROXINE SODIUM 100 MCG PO TABS
100.0000 ug | ORAL_TABLET | Freq: Every day | ORAL | Status: DC
  Administered 2014-08-22 – 2014-08-29 (×8): 100 ug via ORAL
  Filled 2014-08-21 (×8): qty 1

## 2014-08-21 MED ORDER — PRAVASTATIN SODIUM 20 MG PO TABS
20.0000 mg | ORAL_TABLET | Freq: Every day | ORAL | Status: DC
  Administered 2014-08-21 – 2014-08-28 (×8): 20 mg via ORAL
  Filled 2014-08-21 (×8): qty 1

## 2014-08-21 MED ORDER — OXYCODONE-ACETAMINOPHEN 5-325 MG PO TABS
1.0000 | ORAL_TABLET | Freq: Four times a day (QID) | ORAL | Status: DC | PRN
  Administered 2014-08-21 – 2014-08-27 (×10): 1 via ORAL
  Filled 2014-08-21 (×10): qty 1

## 2014-08-21 MED ORDER — ALPRAZOLAM 0.25 MG PO TABS: 0.2500 mg | ORAL_TABLET | Freq: Every evening | ORAL | Status: DC | PRN

## 2014-08-21 MED ORDER — SODIUM CHLORIDE 0.9 % IJ SOLN
3.0000 mL | Freq: Two times a day (BID) | INTRAMUSCULAR | Status: DC
  Administered 2014-08-21 – 2014-08-28 (×13): 3 mL via INTRAVENOUS

## 2014-08-21 MED ORDER — LEVOTHYROXINE SODIUM 88 MCG PO TABS
88.0000 ug | ORAL_TABLET | Freq: Every day | ORAL | Status: DC
  Administered 2014-08-22 – 2014-08-29 (×8): 88 ug via ORAL
  Filled 2014-08-21 (×8): qty 1

## 2014-08-21 MED ORDER — VERAPAMIL HCL ER 120 MG PO TBCR
240.0000 mg | EXTENDED_RELEASE_TABLET | Freq: Every day | ORAL | Status: DC
  Administered 2014-08-22 – 2014-08-27 (×6): 240 mg via ORAL
  Administered 2014-08-28: 08:00:00 120 mg via ORAL
  Administered 2014-08-29: 240 mg via ORAL
  Filled 2014-08-21: qty 1
  Filled 2014-08-21 (×4): qty 2
  Filled 2014-08-21 (×3): qty 1
  Filled 2014-08-21 (×2): qty 2

## 2014-08-21 MED ORDER — HYDROCOD POLST-CPM POLST ER 10-8 MG/5ML PO SUER
5.0000 mL | Freq: Two times a day (BID) | ORAL | Status: DC
  Administered 2014-08-21 – 2014-08-29 (×16): 5 mL via ORAL
  Filled 2014-08-21 (×16): qty 5

## 2014-08-21 MED ORDER — INSULIN ASPART 100 UNIT/ML ~~LOC~~ SOLN
0.0000 [IU] | Freq: Three times a day (TID) | SUBCUTANEOUS | Status: DC
  Administered 2014-08-22: 12:00:00 3 [IU] via SUBCUTANEOUS
  Administered 2014-08-22: 18:00:00 5 [IU] via SUBCUTANEOUS
  Administered 2014-08-22: 08:00:00 8 [IU] via SUBCUTANEOUS
  Administered 2014-08-23: 5 [IU] via SUBCUTANEOUS
  Administered 2014-08-23 (×2): 8 [IU] via SUBCUTANEOUS
  Administered 2014-08-24: 12:00:00 3 [IU] via SUBCUTANEOUS
  Administered 2014-08-24: 17:00:00 8 [IU] via SUBCUTANEOUS
  Administered 2014-08-25: 12:00:00 3 [IU] via SUBCUTANEOUS
  Administered 2014-08-25: 18:00:00 8 [IU] via SUBCUTANEOUS
  Administered 2014-08-26: 11 [IU] via SUBCUTANEOUS
  Administered 2014-08-26 – 2014-08-27 (×3): 5 [IU] via SUBCUTANEOUS
  Administered 2014-08-28: 11:00:00 3 [IU] via SUBCUTANEOUS
  Administered 2014-08-29 (×2): 8 [IU] via SUBCUTANEOUS
  Filled 2014-08-21: qty 8
  Filled 2014-08-21 (×2): qty 5
  Filled 2014-08-21: qty 3
  Filled 2014-08-21: qty 5
  Filled 2014-08-21: qty 3
  Filled 2014-08-21: qty 11
  Filled 2014-08-21 (×4): qty 8
  Filled 2014-08-21: qty 5
  Filled 2014-08-21: qty 3
  Filled 2014-08-21 (×2): qty 8
  Filled 2014-08-21: qty 5

## 2014-08-21 MED ORDER — IPRATROPIUM-ALBUTEROL 0.5-2.5 (3) MG/3ML IN SOLN
3.0000 mL | RESPIRATORY_TRACT | Status: DC
  Filled 2014-08-21: qty 3

## 2014-08-21 MED ORDER — HYDROCHLOROTHIAZIDE 25 MG PO TABS
25.0000 mg | ORAL_TABLET | Freq: Every day | ORAL | Status: DC
  Administered 2014-08-22 – 2014-08-24 (×3): 25 mg via ORAL
  Filled 2014-08-21 (×4): qty 1

## 2014-08-21 MED ORDER — IPRATROPIUM-ALBUTEROL 0.5-2.5 (3) MG/3ML IN SOLN
3.0000 mL | Freq: Four times a day (QID) | RESPIRATORY_TRACT | Status: DC
  Administered 2014-08-22 – 2014-08-24 (×9): 3 mL via RESPIRATORY_TRACT
  Filled 2014-08-21 (×9): qty 3

## 2014-08-21 MED ORDER — INSULIN ASPART 100 UNIT/ML ~~LOC~~ SOLN
0.0000 [IU] | Freq: Every day | SUBCUTANEOUS | Status: DC
  Administered 2014-08-21: 5 [IU] via SUBCUTANEOUS
  Administered 2014-08-22: 23:00:00 2 [IU] via SUBCUTANEOUS
  Administered 2014-08-23: 3 [IU] via SUBCUTANEOUS
  Administered 2014-08-24: 22:00:00 2 [IU] via SUBCUTANEOUS
  Administered 2014-08-25: 100 [IU] via SUBCUTANEOUS
  Administered 2014-08-26 – 2014-08-28 (×2): 2 [IU] via SUBCUTANEOUS
  Administered 2014-08-28: 5 [IU] via SUBCUTANEOUS
  Filled 2014-08-21: qty 2
  Filled 2014-08-21: qty 3
  Filled 2014-08-21: qty 2
  Filled 2014-08-21: qty 5
  Filled 2014-08-21 (×2): qty 2
  Filled 2014-08-21: qty 5
  Filled 2014-08-21: qty 3
  Filled 2014-08-21: qty 2

## 2014-08-21 MED ORDER — ALBUTEROL SULFATE (2.5 MG/3ML) 0.083% IN NEBU
2.5000 mg | INHALATION_SOLUTION | RESPIRATORY_TRACT | Status: DC | PRN
  Administered 2014-08-27: 09:00:00 2.5 mg via RESPIRATORY_TRACT
  Filled 2014-08-21: qty 3

## 2014-08-21 MED ORDER — LOSARTAN POTASSIUM 50 MG PO TABS
100.0000 mg | ORAL_TABLET | Freq: Every day | ORAL | Status: DC
  Administered 2014-08-22 – 2014-08-24 (×3): 100 mg via ORAL
  Filled 2014-08-21 (×3): qty 2

## 2014-08-21 MED ORDER — TAMOXIFEN CITRATE 10 MG PO TABS
20.0000 mg | ORAL_TABLET | Freq: Every day | ORAL | Status: DC
  Administered 2014-08-22 – 2014-08-29 (×8): 20 mg via ORAL
  Filled 2014-08-21 (×2): qty 2
  Filled 2014-08-21: qty 1
  Filled 2014-08-21 (×2): qty 2
  Filled 2014-08-21 (×2): qty 1
  Filled 2014-08-21 (×2): qty 2
  Filled 2014-08-21: qty 1

## 2014-08-21 MED ORDER — HEPARIN SODIUM (PORCINE) 5000 UNIT/ML IJ SOLN
5000.0000 [IU] | Freq: Three times a day (TID) | INTRAMUSCULAR | Status: DC
  Administered 2014-08-21 – 2014-08-28 (×20): 5000 [IU] via SUBCUTANEOUS
  Filled 2014-08-21 (×21): qty 1

## 2014-08-21 MED ORDER — LEVOFLOXACIN IN D5W 250 MG/50ML IV SOLN
250.0000 mg | INTRAVENOUS | Status: DC
  Administered 2014-08-21 – 2014-08-25 (×5): 250 mg via INTRAVENOUS
  Filled 2014-08-21 (×6): qty 50

## 2014-08-21 MED ORDER — ASPIRIN EC 81 MG PO TBEC
81.0000 mg | DELAYED_RELEASE_TABLET | Freq: Every day | ORAL | Status: DC
  Administered 2014-08-22 – 2014-08-29 (×8): 81 mg via ORAL
  Filled 2014-08-21 (×16): qty 1

## 2014-08-21 MED ORDER — CALCIUM CARBONATE ANTACID 500 MG PO CHEW
500.0000 mg | CHEWABLE_TABLET | Freq: Two times a day (BID) | ORAL | Status: DC
  Administered 2014-08-22 – 2014-08-29 (×15): 500 mg via ORAL
  Filled 2014-08-21 (×15): qty 1

## 2014-08-21 MED ORDER — INSULIN GLARGINE 100 UNIT/ML ~~LOC~~ SOLN
18.0000 [IU] | Freq: Two times a day (BID) | SUBCUTANEOUS | Status: DC
  Administered 2014-08-21 – 2014-08-29 (×15): 18 [IU] via SUBCUTANEOUS
  Filled 2014-08-21 (×20): qty 0.18

## 2014-08-21 NOTE — Plan of Care (Signed)
Problem: Discharge Progression Outcomes Goal: Discharge plan in place and appropriate Outcome: Progressing Individualization of Care:   Patient prefers to be called Whitney Wright. Patient lives at home alone. Patient is on 2L-O2 at home. Patient has a history of Lung cancer, Advanced COPD with Chronic Respiratory Failure, Type 2 DM, HTN and Hypothyroidism - controlled by home medications.

## 2014-08-21 NOTE — Telephone Encounter (Signed)
-----   Message from [place] sent at 08/19/2014  9:06 AM EDT ----- Pt was seen in ER was told to see Whitney Wright this week

## 2014-08-21 NOTE — Telephone Encounter (Signed)
Called and spoke to patient. Patient stated that she was in the ER over the weekend due to pneumonia. She states that she is now at her PCP's office and that they are going to admit her because she is no better. I informed patient that she went to the right place and that I hope she feels better.

## 2014-08-21 NOTE — H&P (Signed)
[facility] NAME: Whitney Wright    MR#:  [id number]  DATE OF BIRTH:  05-02-1940  DATE OF ADMISSION:  08/21/2014  PRIMARY CARE PHYSICIAN: Tracie Harrier, MD     CHIEF COMPLAINT:  1 Left sided chest pain. 2 Shortness of breath 3 Ankle edema   HISTORY OF PRESENT ILLNESS:  Whitney Wright  is a 74 y.o. female with a known history of Ca lung,Advanced COPD with Chronic Respiratory Failure,Type 2 DM,HTN, Hypothyroidism-presents to the clinic with left-sided chest pain,cough with productive green sputum for the last 1 week. Patient had been seen in the Emergency room on 08/17/2014 and a CT chest at that time showed evidence of consolidation or in the left lower lobe. Patient had been treated with a course of Prednisone and Tussionex he also been taken Avelox the last few days but continues to be symptomatic she lately has developed bilateral ankle swelling. Patient was also noted to be hypoxemic with O2 sat of 82% on 2 L nasal cannula that improved to 92% on 3 L nasal cannula oxygen   PAST MEDICAL HISTORY:   Past Medical History  Diagnosis Date  . Hypertension   . Cellulitis and abscess of trunk   . Thyroid disease     hypo  . Arthritis   . Personal history of malignant neoplasm of breast   . Diabetes mellitus without complication   . Senile osteoporosis   . 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 ER 50%, PR 0, HER-2/neu nonamplified.. Tamoxifen chosen as bone density showed severe osteoporosis.  . Lung cancer, upper lobe February 2015    Adenocarcinoma with lepidic pattern, second primary fall 2015  . Breast cancer     PAST SURGICAL HISTORY:   Past Surgical History  Procedure Laterality Date  . Abdominal hysterectomy  age 74 . Mammosite balloon placement Left 2012  . Cardiac catheterization  2014  . Breast surgery Left  January 11, 2011    left breast stereotactic biopsy: DCIS  . Appendectomy      SOCIAL HISTORY:   History  Substance Use Topics  . Smoking status: Former Smoker -- 0.00 packs/day for 30 years  . Smokeless tobacco: Never Used  . Alcohol Use: No    FAMILY HISTORY:   Family History  Problem Relation Age of Onset  . Breast cancer Daughter   . Breast cancer Mother     DRUG ALLERGIES:   Allergies  Allergen Reactions  . Macrodantin [Nitrofurantoin] Rash    REVIEW OF SYSTEMS:  CONSTITUTIONAL: Fatigue and weakness  EYES: No blurred or double vision.  EARS, NOSE, AND THROAT: No tinnitus or ear pain.  RESPIRATORY: No cough, shortness of breath, wheezing or hemoptysis.  CARDIOVASCULAR: No chest pain, orthopnea, edema.  GASTROINTESTINAL: No nausea, vomiting, diarrhea or abdominal pain.  GENITOURINARY: No dysuria, hematuria.  ENDOCRINE: No polyuria, nocturia,  HEMATOLOGY: No anemia, easy bruising or bleeding SKIN: No rash or lesion. MUSCULOSKELETAL: No joint pain or arthritis.   NEUROLOGIC: No tingling, numbness, weakness.  PSYCHIATRY: No anxiety or depression.   MEDICATIONS AT HOME:   Prior to Admission medications   Medication Sig Start Date End Date Taking? Authorizing Provider  ADVAIR DISKUS 250-50 MCG/DOSE AEPB Inhale 1 puff into the lungs 2 (two) times daily.  03/05/14  Yes Historical Provider, MD  albuterol (PROVENTIL HFA;VENTOLIN HFA) 108 (90 BASE) MCG/ACT inhaler Inhale into  the lungs every 6 (six) hours as needed for wheezing or shortness of breath.   Yes Historical Provider, MD  ALPRAZolam Duanne Moron) 0.25 MG tablet Take 0.25 mg by mouth at bedtime as needed.  02/26/14  Yes Historical Provider, MD  aspirin 81 MG tablet Take 81 mg by mouth daily.   Yes Historical Provider, MD  calcium carbonate (OS-CAL) 600 MG TABS tablet Take 600 mg by mouth 2 (two) times daily with a meal.   Yes Historical Provider, MD  chlorpheniramine-HYDROcodone (TUSSIONEX PENNKINETIC ER) 10-8 MG/5ML  SUER Take 5 mLs by mouth 2 (two) times daily. 08/17/14  Yes Earleen Newport, MD  Cholecalciferol 1000 UNITS TBDP Take by mouth daily.   Yes Historical Provider, MD  hydrALAZINE (APRESOLINE) 50 MG tablet Take 50 mg by mouth daily.  03/05/14  Yes Historical Provider, MD  hydrochlorothiazide (HYDRODIURIL) 25 MG tablet Take 1 tablet by mouth daily. 08/17/12  Yes Historical Provider, MD  insulin regular (NOVOLIN R,HUMULIN R) 100 units/mL injection Inject into the skin as needed for high blood sugar.   Yes Historical Provider, MD  ipratropium-albuterol (DUONEB) 0.5-2.5 (3) MG/3ML SOLN  12/19/13  Yes Historical Provider, MD  LANTUS SOLOSTAR 100 UNIT/ML SOPN Inject 18 Units into the skin 2 (two) times daily. 07/13/12  Yes Historical Provider, MD  levothyroxine (SYNTHROID, LEVOTHROID) 100 MCG tablet Take 100 mcg by mouth daily before breakfast.  03/08/14  Yes Historical Provider, MD  levothyroxine (SYNTHROID, LEVOTHROID) 88 MCG tablet Take 88 mcg by mouth daily before breakfast.  03/08/14  Yes Historical Provider, MD  losartan (COZAAR) 100 MG tablet Take 1 tablet by mouth daily. 07/07/12  Yes Historical Provider, MD  lovastatin (MEVACOR) 20 MG tablet Take 1 tablet by mouth daily. 07/04/12  Yes Historical Provider, MD  predniSONE (STERAPRED UNI-PAK 21 TAB) 10 MG (21) TBPK tablet Take 1 tablet (10 mg total) by mouth daily. Take 19m tomorrow, then decrease by 162mdaily until gone. 08/17/14  Yes JoEarleen NewportMD  tamoxifen (NOLVADEX) 20 MG tablet Take 20 mg by mouth daily.   Yes Historical Provider, MD  tiotropium (SPIRIVA) 18 MCG inhalation capsule Place 18 mcg into inhaler and inhale daily.   Yes Historical Provider, MD  verapamil (CALAN-SR) 120 MG CR tablet Take 2 tablets by mouth daily. 06/14/12  Yes Historical Provider, MD  oxyCODONE (OXY IR/ROXICODONE) 5 MG immediate release tablet Take 5 mg by mouth every 6 (six) hours as needed.  01/02/14   Historical Provider, MD      VITAL SIGNS:  Blood pressure  168/67, pulse 86, temperature 98.1 F (36.7 C), resp. rate 20, height 5' (1.524 m), weight 70.478 kg (155 lb 6 oz), SpO2 98 %.  PHYSICAL EXAMINATION:  GENERAL:  [age].o.-year-old patient lying in the bed in moderate distress EYES: Pupils equal, round, reactive to light and accommodation. No scleral icterus. Extraocular muscles intact.  HEENT: Head atraumatic, normocephalic. Oropharynx and nasopharynx clear.  NECK:  Supple, no jugular venous distention. No thyroid enlargement, no tenderness.  LUNGS: Normal breath sounds bilaterally, no wheezing, rales,rhonchi or crepitation. No use of accessory muscles of respiration.  CARDIOVASCULAR: S1, S2 normal. No murmurs, rubs, or gallops.  Tenderness left chest wall in infra axillary region ABDOMEN: Soft, nontender, nondistended. Bowel sounds present. No organomegaly or mass.  EXTREMITIES:1 + pedal edema No  cyanosis, or clubbing.  NEUROLOGIC: Cranial nerves II through XII are intact. Muscle strength 5/5 in all extremities. Sensation intact. Gait not checked.  PSYCHIATRIC: The patient is alert and oriented  x 3.  SKIN: No obvious rash, lesion, or ulcer.   LABORATORY PANEL:   CBC  Recent Labs Lab 08/17/14 1006  WBC 7.8  HGB 10.6*  HCT 33.1*  PLT 168   ------------------------------------------------------------------------------------------------------------------  Chemistries   Recent Labs Lab 08/17/14 1006  NA 140  K 4.6  CL 111  CO2 25  GLUCOSE 144*  BUN 34*  CREATININE 1.94*  CALCIUM 7.9*  AST 20  ALT 20  ALKPHOS 83  BILITOT 0.3   ------------------------------------------------------------------------------------------------------------------  Cardiac Enzymes  Recent Labs Lab 08/17/14 1006  TROPONINI <0.03   ------------------------------------------------------------------------------------------------------------------  RADIOLOGY:  No results found.  EKG:   Orders placed or performed during the hospital  encounter of 08/17/14  . ED EKG  . ED EKG  . ED EKG  . ED EKG  . EKG    IMPRESSION AND PLAN:   1 Acute on chronic respiratory failure secondary to COPD and left lung  Ca and consolidation Admit pt to [facility]. CXR today Continue O2  Start IV Solumedrol 60 mg qd Start IV Levaquin 250 mg Iv qd Check sputum gram stain and culture Consult Oncology 2 [id number] Losartan and Verapamil 3 Type 2 DM; Start SSI 4 Hypothyroidism:Continue Levothyroxine 5 CKD: Continue to monitor closely  6 Left sided chest pain:   Management plans discussed with the patient, family and they are in agreement.  CODE STATUS: DNR  TOTAL TIME TAKING CARE OF THIS PATIENT: 45 minutes.    Babyboy Loya M.D on 08/21/2014 at 4:56 PM   CC: Primary care physician; Tracie Harrier, MD

## 2014-08-21 NOTE — Plan of Care (Signed)
Problem: Discharge Progression Outcomes Goal: Other Discharge Outcomes/Goals Plan of care progress to goal:  Direct admit from Dr. Ginette Pitman office. Patient on 3L-O2. C/o left sided chest pain - percocet given once for a pain score of 9 out of 10. Chest xray completed - results pending. Patient placed on telemetry monitoring.

## 2014-08-22 LAB — CBC
HCT: 33.5 % — ABNORMAL LOW (ref 35.0–47.0)
Hemoglobin: 10.6 g/dL — ABNORMAL LOW (ref 12.0–16.0)
MCH: 26.8 pg (ref 26.0–34.0)
MCHC: 31.8 g/dL — ABNORMAL LOW (ref 32.0–36.0)
MCV: 84.3 fL (ref 80.0–100.0)
PLATELETS: 170 10*3/uL (ref 150–440)
RBC: 3.97 MIL/uL (ref 3.80–5.20)
RDW: 14.8 % — AB (ref 11.5–14.5)
WBC: 8.2 10*3/uL (ref 3.6–11.0)

## 2014-08-22 LAB — GLUCOSE, CAPILLARY
GLUCOSE-CAPILLARY: 236 mg/dL — AB (ref 65–99)
GLUCOSE-CAPILLARY: 245 mg/dL — AB (ref 65–99)
Glucose-Capillary: 260 mg/dL — ABNORMAL HIGH (ref 65–99)
Glucose-Capillary: 194 mg/dL — ABNORMAL HIGH (ref 65–99)
Glucose-Capillary: 202 mg/dL — ABNORMAL HIGH (ref 65–99)

## 2014-08-22 LAB — COMPREHENSIVE METABOLIC PANEL
ALBUMIN: 2.2 g/dL — AB (ref 3.5–5.0)
ALT: 24 U/L (ref 14–54)
ANION GAP: 7 (ref 5–15)
AST: 16 U/L (ref 15–41)
Alkaline Phosphatase: 71 U/L (ref 38–126)
BILIRUBIN TOTAL: 0.4 mg/dL (ref 0.3–1.2)
BUN: 63 mg/dL — AB (ref 6–20)
CO2: 27 mmol/L (ref 22–32)
Calcium: 8.3 mg/dL — ABNORMAL LOW (ref 8.9–10.3)
Chloride: 103 mmol/L (ref 101–111)
Creatinine, Ser: 1.91 mg/dL — ABNORMAL HIGH (ref 0.44–1.00)
GFR calc Af Amer: 28 mL/min — ABNORMAL LOW (ref >60)
GFR, EST NON AFRICAN AMERICAN: 24 mL/min — AB (ref >60)
Glucose, Bld: 304 mg/dL — ABNORMAL HIGH (ref 65–99)
Potassium: 4.6 mmol/L (ref 3.5–5.1)
Sodium: 137 mmol/L (ref 135–145)
Total Protein: 5.2 g/dL — ABNORMAL LOW (ref 6.5–8.1)

## 2014-08-22 LAB — TROPONIN I: Troponin I: <0.03 ng/mL (ref <0.031)

## 2014-08-22 LAB — EXPECTORATED SPUTUM ASSESSMENT W REFEX TO RESP CULTURE

## 2014-08-22 LAB — EXPECTORATED SPUTUM ASSESSMENT W GRAM STAIN, RFLX TO RESP C

## 2014-08-22 MED ORDER — CLONIDINE HCL 0.1 MG PO TABS
0.1000 mg | ORAL_TABLET | Freq: Once | ORAL | Status: AC
  Administered 2014-08-22: 06:00:00 0.1 mg via ORAL
  Filled 2014-08-22: qty 1

## 2014-08-22 NOTE — Plan of Care (Signed)
Problem: Discharge Progression Outcomes Goal: Other Discharge Outcomes/Goals Plan of care progress to goal:  Patient c/o left sided chest pain r/t coughing relieved by PRN Percocet and scheduled cough medication. Patient on 2L-O2, chronically. IV antibiotics continue. Up to [facility] with one person assist. Telemetry monitoring continues - NSR.

## 2014-08-22 NOTE — Progress Notes (Signed)
Inpatient Diabetes Program Recommendations  AACE/ADA: New Consensus Statement on Inpatient Glycemic Control (2013)  Target Ranges:  Prepandial:   less than 140 mg/dL      Peak postprandial:   less than 180 mg/dL (1-2 hours)      Critically ill patients:  140 - 180 mg/dL   Results for KALISE, Whitney Wright (MRN [id number]) as of 08/22/2014 09:33  Ref. Range 08/21/2014 16:59 08/21/2014 23:06 08/22/2014 07:24  Glucose-Capillary Latest Ref Range: 65-99 mg/dL 291 (H) 380 (H) 260 (H)    Reason for Assessment: elevated CBG  Diabetes history: Type 2 Outpatient Diabetes medications: Lantus insulin 18 units bid, R insulin prn for elevated CBG Current orders for Inpatient glycemic control: Lantus insulin 18 units bid, Novolog insulin 0-15 units tid and 0-5 units qhs  Please consider increasing Lantus insulin to 20 units bid (fasting blood sugar '260mg'$ /dl) Consider adding Novolog mealtime insulin 5 units tid.  Continue Novolog sliding scale tid and hs as ordered.   Gentry Fitz, RN, BA, MHA, CDE Diabetes Coordinator Inpatient Diabetes Program  [phone] (Team Pager) [phone] ([place]) 08/22/2014 9:39 AM

## 2014-08-22 NOTE — Progress Notes (Addendum)
Dr. Gilford Rile on call notified BP 220/88 manually. Pt asymptomatic and denies headache.   Telephone order read back & verified for Clonidine 0.'1mg'$  once STAT and let Dr. Ginette Pitman decide if he wants to make any further changes when he rounds this morning. Will re-assess BP after giving.   Whitney Wright [employer]

## 2014-08-22 NOTE — Plan of Care (Signed)
Problem: Discharge Progression Outcomes Goal: Other Discharge Outcomes/Goals Plan of care progress to goals: 1. Left sided chest pain r/t coughing relieved by PRN Percocet and schedule cough medication. 2. Hemodynamically:              -VSS, remains on 3L O2 with stable sats, NSR on telemetry             -IV Abx given as scheduled             -Blood sugar 380 at bedtime, sliding scale and scheduled lantus coverage given 3. Tolerating carb modified diet

## 2014-08-22 NOTE — Care Management (Signed)
Admitted to [facility] with the diagnosis of acute/chronic respiratory failure. Lives alone. Daughter is Delene Loll [phone]). Home oxygen thru [employer] 2-3 months. No home Health. No skilled facility. Uses no aides for ambulation, doesn't drive. Sees Dr. Ginette Pitman. Last seen yesterday. Np Life Alert. Neighbors and daughter helps with errands. Daughter will transport. IV SoluMedrol and Levaquin continue. Shelbie Ammons RN MSN Care Management [phone]

## 2014-08-22 NOTE — Progress Notes (Signed)
PROGRESS NOTE  Whitney Wright XMI:[id number] DOB: 1940/04/14 DOA: 08/21/2014 PCP: Whitney Harrier, MD  Subjective: Pt states her pain is better. Less short of breath Consultants: Oncology   Objective: BP 159/50 mmHg  Pulse 61  Temp(Src) 97.9 F (36.6 C) (Oral)  Resp 18  Ht 5' (1.524 m)  Wt 70.308 kg (155 lb)  BMI 30.27 kg/m2  SpO2 94%  Intake/Output Summary (Last 24 hours) at 08/22/14 1529 Last data filed at 08/22/14 1404  Gross per 24 hour  Intake    480 ml  Output   1500 ml  Net  -1020 ml   Filed Weights   08/21/14 1528 08/21/14 1810  Weight: 70.478 kg (155 lb 6 oz) 70.308 kg (155 lb)    Exam:        HEENT: Whitney Wright AT   General: In mild distress  Cardiovascular: S1 S2  Respiratory: Rhonchi +  Abdomen: Soft Non tender  Neuro:Non Focal  Data Reviewed: Basic Metabolic Panel:  Recent Labs Lab 08/17/14 1006 08/21/14 1802 08/22/14 0726  NA 140 139 137  K 4.6 4.5 4.6  CL 111 103 103  CO2 '25 25 27  '$ GLUCOSE 144* 341* 304*  BUN 34* 58* 63*  CREATININE 1.94* 1.96* 1.91*  CALCIUM 7.9* 8.5* 8.3*   Liver Function Tests:  Recent Labs Lab 08/17/14 1006 08/21/14 1802 08/22/14 0726  AST '20 21 16  '$ ALT '20 25 24  '$ ALKPHOS 83 85 71  BILITOT 0.3 0.3 0.4  PROT 5.9* 5.5* 5.2*  ALBUMIN 2.5* 2.4* 2.2*   No results for input(s): LIPASE, AMYLASE in the last 168 hours. No results for input(s): AMMONIA in the last 168 hours. CBC:  Recent Labs Lab 08/17/14 1006 08/21/14 1802 08/22/14 0726  WBC 7.8 13.2* 8.2  NEUTROABS  --  11.9*  --   HGB 10.6* 11.4* 10.6*  HCT 33.1* 35.7 33.5*  MCV 83.9 84.2 84.3  PLT 168 203 170   Cardiac Enzymes:    Recent Labs Lab 08/17/14 1006 08/21/14 1802 08/21/14 2357 08/22/14 0726  TROPONINI <0.03 <0.03 <0.03 <0.03   BNP (last 3 results)  Recent Labs  08/17/14 1006  BNP 390.0*    ProBNP (last 3 results) No results for input(s): PROBNP in the last 8760 hours.  CBG:  Recent Labs Lab 08/21/14 1659  08/21/14 2306 08/22/14 0724 08/22/14 1115  GLUCAP 291* 380* 260* 194*    No results found for this or any previous visit (from the past 240 hour(s)).   Studies: X-ray Chest Pa And Lateral  08/21/2014   CLINICAL DATA:  Shortness of breath, cough, and chest congestion. Emphysema.  EXAM: CHEST  2 VIEW  COMPARISON:  Chest x-ray and chest CT dated 08/17/2014  FINDINGS: There is slight cardiomegaly with calcification of the thoracic aorta. Persistent patchy infiltrate in the lingula and left lower lobe, unchanged. COPD. Chronic peribronchial thickening on the right. Diffuse osteopenia. Unchanged right apical paraspinal mass.  IMPRESSION: No change. Persistent patchy infiltrates at the left lung base superimposed on COPD. Unchanged chronic bronchitic changes. Unchanged right paraspinal mass.   Electronically Signed   By: Lorriane Shire M.D.   On: 08/21/2014 19:06    Scheduled Meds: . aspirin EC  81 mg Oral Daily  . calcium carbonate  500 mg Oral BID WC  . chlorpheniramine-HYDROcodone  5 mL Oral BID  . heparin  5,000 Units Subcutaneous 3 times per day  . hydrALAZINE  50 mg Oral Daily  . hydrochlorothiazide  25 mg Oral Daily  .  insulin aspart  0-15 Units Subcutaneous TID WC  . insulin aspart  0-5 Units Subcutaneous QHS  . insulin glargine  18 Units Subcutaneous BID  . ipratropium-albuterol  3 mL Nebulization QID  . levofloxacin (LEVAQUIN) IV  250 mg Intravenous Q24H  . levothyroxine  100 mcg Oral QAC breakfast  . levothyroxine  88 mcg Oral QAC breakfast  . losartan  100 mg Oral Daily  . methylPREDNISolone (SOLU-MEDROL) injection  60 mg Intravenous Q24H  . pravastatin  20 mg Oral q1800  . sodium chloride  3 mL Intravenous Q12H  . tamoxifen  20 mg Oral Daily  . verapamil  240 mg Oral Daily    Continuous Infusions:   Assessment/Plan:  1 Acute on Chronic Respiratory Failure secondary to COPD, Left sided Pneumonia. Hx of left Lung ca  On Levaquin Check Sputum cultures. Continue SVN's,  O2  and IV Solumedrol. Await Oncology input  2 HTN: On Losartan 3 Hypothyroidism:On Levothyroxine 4 CKD: Continue to monitor. 5 Chest pain: Troponin negative. Continue pain management  6 Physical Therapy    Code Status: DNR  Family Communication: Daughter      Whitney Wright   08/22/2014, 3:29 PM  LOS: 1 day

## 2014-08-23 LAB — GLUCOSE, CAPILLARY
GLUCOSE-CAPILLARY: 277 mg/dL — AB (ref 65–99)
Glucose-Capillary: 245 mg/dL — ABNORMAL HIGH (ref 65–99)
Glucose-Capillary: 264 mg/dL — ABNORMAL HIGH (ref 65–99)
Glucose-Capillary: 287 mg/dL — ABNORMAL HIGH (ref 65–99)
Glucose-Capillary: 222 mg/dL — ABNORMAL HIGH (ref 65–99)

## 2014-08-23 LAB — CBC WITH DIFFERENTIAL/PLATELET
BASOS ABS: 0 10*3/uL (ref 0–0.1)
BASOS PCT: 0 %
Eosinophils Absolute: 0 10*3/uL (ref 0–0.7)
Eosinophils Relative: 0 %
HCT: 36.2 % (ref 35.0–47.0)
Hemoglobin: 11.5 g/dL — ABNORMAL LOW (ref 12.0–16.0)
Lymphocytes Relative: 5 %
Lymphs Abs: 0.5 10*3/uL — ABNORMAL LOW (ref 1.0–3.6)
MCH: 26.8 pg (ref 26.0–34.0)
MCHC: 31.9 g/dL — AB (ref 32.0–36.0)
MCV: 83.9 fL (ref 80.0–100.0)
Monocytes Absolute: 0.2 10*3/uL (ref 0.2–0.9)
Monocytes Relative: 2 %
NEUTROS PCT: 93 %
Neutro Abs: 10.2 10*3/uL — ABNORMAL HIGH (ref 1.4–6.5)
Platelets: 182 10*3/uL (ref 150–440)
RBC: 4.31 MIL/uL (ref 3.80–5.20)
RDW: 14.9 % — ABNORMAL HIGH (ref 11.5–14.5)
WBC: 10.9 10*3/uL (ref 3.6–11.0)

## 2014-08-23 LAB — HEMOGLOBIN A1C: Hgb A1c MFr Bld: 7.1 % — ABNORMAL HIGH (ref 4.0–6.0)

## 2014-08-23 LAB — BASIC METABOLIC PANEL
ANION GAP: 9 (ref 5–15)
BUN: 77 mg/dL — ABNORMAL HIGH (ref 6–20)
CALCIUM: 8.5 mg/dL — AB (ref 8.9–10.3)
CO2: 26 mmol/L (ref 22–32)
CREATININE: 2.1 mg/dL — AB (ref 0.44–1.00)
Chloride: 99 mmol/L — ABNORMAL LOW (ref 101–111)
GFR calc Af Amer: 25 mL/min — ABNORMAL LOW (ref >60)
GFR calc non Af Amer: 22 mL/min — ABNORMAL LOW (ref >60)
Glucose, Bld: 256 mg/dL — ABNORMAL HIGH (ref 65–99)
Potassium: 4.5 mmol/L (ref 3.5–5.1)
Sodium: 134 mmol/L — ABNORMAL LOW (ref 135–145)

## 2014-08-23 MED ORDER — CALCIUM CARBONATE ANTACID 500 MG PO CHEW
1.0000 | CHEWABLE_TABLET | Freq: Three times a day (TID) | ORAL | Status: DC | PRN
  Administered 2014-08-23: 400 mg via ORAL
  Administered 2014-08-25: 01:00:00 200 mg via ORAL
  Administered 2014-08-25: 400 mg via ORAL
  Administered 2014-08-28: 200 mg via ORAL
  Filled 2014-08-23 (×2): qty 1
  Filled 2014-08-23 (×2): qty 2

## 2014-08-23 MED ORDER — METHYLPREDNISOLONE SODIUM SUCC 40 MG IJ SOLR
40.0000 mg | INTRAMUSCULAR | Status: DC
  Administered 2014-08-23 – 2014-08-25 (×3): 40 mg via INTRAVENOUS
  Filled 2014-08-23 (×3): qty 1

## 2014-08-23 NOTE — Plan of Care (Signed)
Problem: Discharge Progression Outcomes Goal: Other Discharge Outcomes/Goals Outcome: Progressing Plan of care progress to goal for: Pneumonia: Pt remains on 2L O2 which she wears at home. O2 sats in 90s. IV solumedrol ordered and IV ABX infusing.  C/o left chest pain, percocet given. Patient stated relief.  SOB with exertion. Plan for d/c in 1-2 days per Dr. Ginette Pitman.

## 2014-08-23 NOTE — Progress Notes (Signed)
   08/23/14 0944  Clinical Encounter Type  Visited With Patient  Visit Type Initial  Visited with patient and provided pastoral presence and support.  Patient said she was feeling ok and is hoping to go home today or tomorrow in time to go to a family graduation party.  Patient was calm and very pleasant to me, although she did say she was in a bit of pain on her left side and that she does have some pneumonia on that side.  Pt told me she appreciated me coming to see her today and wished me well.  [place] [phone]

## 2014-08-23 NOTE — Progress Notes (Signed)
Inpatient Diabetes Program Recommendations  AACE/ADA: New Consensus Statement on Inpatient Glycemic Control (2013)  Target Ranges:  Prepandial:   less than 140 mg/dL      Peak postprandial:   less than 180 mg/dL (1-2 hours)      Critically ill patients:  140 - 180 mg/dL    Results for Whitney Wright, Whitney Wright (MRN [id number]) as of 08/23/2014 09:27  Ref. Range 08/22/2014 07:24 08/22/2014 11:15 08/22/2014 16:18 08/22/2014 18:06 08/22/2014 22:39  Glucose-Capillary Latest Ref Range: 65-99 mg/dL 260 (H) 194 (H) 202 (H) 236 (H) 245 (H)    Results for Whitney Wright, Whitney Wright (MRN [id number]) as of 08/23/2014 09:27  Ref. Range 08/23/2014 07:24  Glucose-Capillary Latest Ref Range: 65-99 mg/dL 245 (H)    Results for Whitney Wright, Whitney Wright (MRN [id number]) as of 08/23/2014 09:27  Ref. Range 08/21/2014 18:02  Hemoglobin A1C Latest Ref Range: 4.0-6.0 % 7.1 (H)     Admit with: COPD/ Pneumonia  History: DM, HTN, COPD, Breast/Lung cancer  Home DM Meds: Lantus 18 units bid       Regular SSI prn for hyperglycemia  Current DM Orders: Lantus 18 units bid            Novolog Moderate SSI (0-15 units) tid ac + HS    **Note IV Solumedrol decreased to 40 mg daily today (was 60 mg daily yesterday).  **Patient eating 100% of meals.  **Having significant glucose elevations while on IV steroids.  Well controlled prior to admission as evidenced by A1c of 7.1%.     MD- Please consider the following in-hospital insulin adjustments:  1. Increase Lantus to 20 units bid (10% increase)  2. Add Novolog Meal Coverage- Novolog 3 units tid with meals [Please use Glycemic Control Order set to order Novolog Meal Coverage]  3. Continue Current Novolog SSI    Will follow Reneka Nebergall Lowella Dell RN, MSN, CDE Diabetes Coordinator Inpatient Glycemic Control Team Team Pager: [phone] (8a-5p)

## 2014-08-23 NOTE — Plan of Care (Signed)
Problem: Discharge Progression Outcomes Goal: Discharge plan in place and appropriate Outcome: Progressing Patient prefers to be called Anuradha. Patient lives at home alone. Patient is on 2L-O2 at home. Patient has a history of Lung cancer, Advanced COPD with Chronic Respiratory Failure, Type 2 DM, HTN and Hypothyroidism - controlled by home medications.  Goal: Other Discharge Outcomes/Goals Outcome: Progressing Pt alert and oriented up to [facility] with standby assist. 02 sats 94% on 2L O2. Pt complained of moderate pain in the mid abdominal area which was relieved with prn medication. Will continue to monitor.

## 2014-08-23 NOTE — Progress Notes (Signed)
PROGRESS NOTE  Whitney Wright JOI:[id number] DOB: 12/17/1940 DOA: 08/21/2014 PCP: Tracie Harrier, MD  Subjective: C/o Left sided chest pain this am Cough - not bad  Consultants: Oncology   Objective: BP 206/67 mmHg  Pulse 75  Temp(Src) 97.7 F (36.5 C) (Oral)  Resp 20  Ht 5' (1.524 m)  Wt 70.308 kg (155 lb)  BMI 30.27 kg/m2  SpO2 92%  Intake/Output Summary (Last 24 hours) at 08/23/14 0817 Last data filed at 08/23/14 0319  Gross per 24 hour  Intake    770 ml  Output   1100 ml  Net   -330 ml   Filed Weights   08/21/14 1528 08/21/14 1810  Weight: 70.478 kg (155 lb 6 oz) 70.308 kg (155 lb)    Exam:        HEENT: [place] AT   General: In mild distress  Cardiovascular: S1 S2. Left sided chest wall tenderness +  Respiratory: Rhonchi +  Abdomen: Soft Non tender  Neuro:Non Focal  Data Reviewed: Basic Metabolic Panel:  Recent Labs Lab 08/17/14 1006 08/21/14 1802 08/22/14 0726 08/23/14 0611  NA 140 139 137 134*  K 4.6 4.5 4.6 4.5  CL 111 103 103 99*  CO2 '25 25 27 26  '$ GLUCOSE 144* 341* 304* 256*  BUN 34* 58* 63* 77*  CREATININE 1.94* 1.96* 1.91* 2.10*  CALCIUM 7.9* 8.5* 8.3* 8.5*   Liver Function Tests:  Recent Labs Lab 08/17/14 1006 08/21/14 1802 08/22/14 0726  AST '20 21 16  '$ ALT '20 25 24  '$ ALKPHOS 83 85 71  BILITOT 0.3 0.3 0.4  PROT 5.9* 5.5* 5.2*  ALBUMIN 2.5* 2.4* 2.2*   No results for input(s): LIPASE, AMYLASE in the last 168 hours. No results for input(s): AMMONIA in the last 168 hours. CBC:  Recent Labs Lab 08/17/14 1006 08/21/14 1802 08/22/14 0726 08/23/14 0611  WBC 7.8 13.2* 8.2 10.9  NEUTROABS  --  11.9*  --  10.2*  HGB 10.6* 11.4* 10.6* 11.5*  HCT 33.1* 35.7 33.5* 36.2  MCV 83.9 84.2 84.3 83.9  PLT 168 203 170 182   Cardiac Enzymes:    Recent Labs Lab 08/17/14 1006 08/21/14 1802 08/21/14 2357 08/22/14 0726  TROPONINI <0.03 <0.03 <0.03 <0.03   BNP (last 3 results)  Recent Labs  08/17/14 1006  BNP 390.0*     ProBNP (last 3 results) No results for input(s): PROBNP in the last 8760 hours.  CBG:  Recent Labs Lab 08/22/14 1115 08/22/14 1618 08/22/14 1806 08/22/14 2239 08/23/14 0724  GLUCAP 194* 202* 236* 245* 245*    Recent Results (from the past 240 hour(s))  Culture, sputum-assessment     Status: None   Collection Time: 08/22/14  9:43 AM  Result Value Ref Range Status   Specimen Description EXPECTORATED SPUTUM  Final   Special Requests NONE  Final   Sputum evaluation THIS SPECIMEN IS ACCEPTABLE FOR SPUTUM CULTURE  Final   Report Status 08/22/2014 FINAL  Final     Studies: No results found.  Scheduled Meds: . aspirin EC  81 mg Oral Daily  . calcium carbonate  500 mg Oral BID WC  . chlorpheniramine-HYDROcodone  5 mL Oral BID  . heparin  5,000 Units Subcutaneous 3 times per day  . hydrALAZINE  50 mg Oral Daily  . hydrochlorothiazide  25 mg Oral Daily  . insulin aspart  0-15 Units Subcutaneous TID WC  . insulin aspart  0-5 Units Subcutaneous QHS  . insulin glargine  18 Units Subcutaneous BID  .  ipratropium-albuterol  3 mL Nebulization QID  . levofloxacin (LEVAQUIN) IV  250 mg Intravenous Q24H  . levothyroxine  100 mcg Oral QAC breakfast  . levothyroxine  88 mcg Oral QAC breakfast  . losartan  100 mg Oral Daily  . methylPREDNISolone (SOLU-MEDROL) injection  60 mg Intravenous Q24H  . pravastatin  20 mg Oral q1800  . sodium chloride  3 mL Intravenous Q12H  . tamoxifen  20 mg Oral Daily  . verapamil  240 mg Oral Daily    Continuous Infusions:   Assessment/Plan:  1 Acute on Chronic Respiratory Failure secondary to COPD, Left sided Pneumonia. Hx of left Lung ca  On Levaquin Check Sputum cultures. Continue SVN's, O2  and IV Solumedrol. Await Oncology input  2 HTN: On Losartan 3 Hypothyroidism:On Levothyroxine 4 Acute on Chronic Renal failure: Continue to monitor. Decrease solumedrol to 40 mg qd  5 Chest pain: Troponin negative. Continue pain management  6  Physical Therapy    Code Status: DNR  Family Communication: Daughter      Tracie Harrier   08/23/2014, 8:17 AM  LOS: 2 days

## 2014-08-24 LAB — CBC WITH DIFFERENTIAL/PLATELET
Basophils Absolute: 0 10*3/uL (ref 0–0.1)
Basophils Relative: 0 %
Eosinophils Absolute: 0 10*3/uL (ref 0–0.7)
Eosinophils Relative: 0 %
HCT: 33.4 % — ABNORMAL LOW (ref 35.0–47.0)
HEMOGLOBIN: 10.5 g/dL — AB (ref 12.0–16.0)
Lymphocytes Relative: 7 %
Lymphs Abs: 1 10*3/uL (ref 1.0–3.6)
MCH: 26.3 pg (ref 26.0–34.0)
MCHC: 31.6 g/dL — ABNORMAL LOW (ref 32.0–36.0)
MCV: 83.1 fL (ref 80.0–100.0)
Monocytes Absolute: 0.8 10*3/uL (ref 0.2–0.9)
Monocytes Relative: 6 %
Neutro Abs: 13.3 10*3/uL — ABNORMAL HIGH (ref 1.4–6.5)
Neutrophils Relative %: 87 %
PLATELETS: 159 10*3/uL (ref 150–440)
RBC: 4.02 MIL/uL (ref 3.80–5.20)
RDW: 15 % — ABNORMAL HIGH (ref 11.5–14.5)
WBC: 15.2 10*3/uL — AB (ref 3.6–11.0)

## 2014-08-24 LAB — GLUCOSE, CAPILLARY
GLUCOSE-CAPILLARY: 103 mg/dL — AB (ref 65–99)
GLUCOSE-CAPILLARY: 225 mg/dL — AB (ref 65–99)
Glucose-Capillary: 173 mg/dL — ABNORMAL HIGH (ref 65–99)
Glucose-Capillary: 260 mg/dL — ABNORMAL HIGH (ref 65–99)

## 2014-08-24 LAB — BASIC METABOLIC PANEL
Anion gap: 8 (ref 5–15)
BUN: 85 mg/dL — ABNORMAL HIGH (ref 6–20)
CO2: 25 mmol/L (ref 22–32)
Calcium: 8.6 mg/dL — ABNORMAL LOW (ref 8.9–10.3)
Chloride: 102 mmol/L (ref 101–111)
Creatinine, Ser: 2.13 mg/dL — ABNORMAL HIGH (ref 0.44–1.00)
GFR calc Af Amer: 25 mL/min — ABNORMAL LOW (ref >60)
GFR calc non Af Amer: 21 mL/min — ABNORMAL LOW (ref >60)
Glucose, Bld: 174 mg/dL — ABNORMAL HIGH (ref 65–99)
Potassium: 4.4 mmol/L (ref 3.5–5.1)
Sodium: 135 mmol/L (ref 135–145)

## 2014-08-24 MED ORDER — SODIUM CHLORIDE 0.9 % IV SOLN
INTRAVENOUS | Status: AC
  Administered 2014-08-24: 13:00:00 via INTRAVENOUS

## 2014-08-24 MED ORDER — IPRATROPIUM-ALBUTEROL 0.5-2.5 (3) MG/3ML IN SOLN
3.0000 mL | Freq: Three times a day (TID) | RESPIRATORY_TRACT | Status: DC
  Administered 2014-08-24 – 2014-08-29 (×15): 3 mL via RESPIRATORY_TRACT
  Filled 2014-08-24 (×16): qty 3

## 2014-08-24 NOTE — Plan of Care (Signed)
Problem: Discharge Progression Outcomes Goal: Discharge plan in place and appropriate Outcome: Progressing Patient prefers to be called Nicey. Patient lives at home alone. Patient is on 2L-O2 at home. Patient has a history of Lung cancer, Advanced COPD with Chronic Respiratory Failure, Type 2 DM, HTN and Hypothyroidism - controlled by home medications.   Goal: Other Discharge Outcomes/Goals Outcome: Progressing Pt remains of 2L o2 sats at 95%. Pt comments she has mild pain but refuses pain medication. Up to [facility] with standby assist.

## 2014-08-24 NOTE — Progress Notes (Signed)
PROGRESS NOTE  Whitney Wright INO:[id number] DOB: 09/01/1940 DOA: 08/21/2014 PCP: Tracie Harrier, MD  Subjective: C/o Left sided chest pain this am Cough - not bad  Consultants: Oncology   Objective: BP 182/65 mmHg  Pulse 75  Temp(Src) 97.6 F (36.4 C) (Oral)  Resp 20  Ht 5' (1.524 m)  Wt 70.308 kg (155 lb)  BMI 30.27 kg/m2  SpO2 96%  Intake/Output Summary (Last 24 hours) at 08/24/14 1218 Last data filed at 08/24/14 0800  Gross per 24 hour  Intake    480 ml  Output   1150 ml  Net   -670 ml   Filed Weights   08/21/14 1528 08/21/14 1810  Weight: 70.478 kg (155 lb 6 oz) 70.308 kg (155 lb)    Exam:        HEENT: [place] AT   General: In mild distress  Cardiovascular: S1 S2. Left sided chest wall tenderness +  Respiratory: Rhonchi +  Abdomen: Soft Non tender  Neuro:Non Focal  Data Reviewed: Basic Metabolic Panel:  Recent Labs Lab 08/21/14 1802 08/22/14 0726 08/23/14 0611 08/24/14 0456  NA 139 137 134* 135  K 4.5 4.6 4.5 4.4  CL 103 103 99* 102  CO2 '25 27 26 25  '$ GLUCOSE 341* 304* 256* 174*  BUN 58* 63* 77* 85*  CREATININE 1.96* 1.91* 2.10* 2.13*  CALCIUM 8.5* 8.3* 8.5* 8.6*   Liver Function Tests:  Recent Labs Lab 08/21/14 1802 08/22/14 0726  AST 21 16  ALT 25 24  ALKPHOS 85 71  BILITOT 0.3 0.4  PROT 5.5* 5.2*  ALBUMIN 2.4* 2.2*   No results for input(s): LIPASE, AMYLASE in the last 168 hours. No results for input(s): AMMONIA in the last 168 hours. CBC:  Recent Labs Lab 08/21/14 1802 08/22/14 0726 08/23/14 0611 08/24/14 0456  WBC 13.2* 8.2 10.9 15.2*  NEUTROABS 11.9*  --  10.2* 13.3*  HGB 11.4* 10.6* 11.5* 10.5*  HCT 35.7 33.5* 36.2 33.4*  MCV 84.2 84.3 83.9 83.1  PLT 203 170 182 159   Cardiac Enzymes:    Recent Labs Lab 08/21/14 1802 08/21/14 2357 08/22/14 0726  TROPONINI <0.03 <0.03 <0.03   BNP (last 3 results)  Recent Labs  08/17/14 1006  BNP 390.0*    ProBNP (last 3 results) No results for input(s):  PROBNP in the last 8760 hours.  CBG:  Recent Labs Lab 08/23/14 1548 08/23/14 1930 08/23/14 2325 08/24/14 0830 08/24/14 1120  GLUCAP 287* 277* 222* 103* 173*    Recent Results (from the past 240 hour(s))  Culture, sputum-assessment     Status: None   Collection Time: 08/22/14  9:43 AM  Result Value Ref Range Status   Specimen Description EXPECTORATED SPUTUM  Final   Special Requests NONE  Final   Sputum evaluation THIS SPECIMEN IS ACCEPTABLE FOR SPUTUM CULTURE  Final   Report Status 08/22/2014 FINAL  Final  Culture, respiratory (NON-Expectorated)     Status: None (Preliminary result)   Collection Time: 08/22/14  9:43 AM  Result Value Ref Range Status   Specimen Description EXPECTORATED SPUTUM  Final   Special Requests NONE Reflexed from [id number]  Final   Gram Stain   Final    MANY WBC SEEN MODERATE GRAM POSITIVE COCCI MODERATE GRAM NEGATIVE RODS    Culture HOLDING FOR POSSIBLE PATHOGEN  Final   Report Status PENDING  Incomplete     Studies: No results found.  Scheduled Meds: . aspirin EC  81 mg Oral Daily  . calcium carbonate  500 mg Oral BID WC  . chlorpheniramine-HYDROcodone  5 mL Oral BID  . heparin  5,000 Units Subcutaneous 3 times per day  . hydrALAZINE  50 mg Oral Daily  . hydrochlorothiazide  25 mg Oral Daily  . insulin aspart  0-15 Units Subcutaneous TID WC  . insulin aspart  0-5 Units Subcutaneous QHS  . insulin glargine  18 Units Subcutaneous BID  . ipratropium-albuterol  3 mL Nebulization TID  . levofloxacin (LEVAQUIN) IV  250 mg Intravenous Q24H  . levothyroxine  100 mcg Oral QAC breakfast  . levothyroxine  88 mcg Oral QAC breakfast  . losartan  100 mg Oral Daily  . methylPREDNISolone (SOLU-MEDROL) injection  40 mg Intravenous Q24H  . pravastatin  20 mg Oral q1800  . sodium chloride  3 mL Intravenous Q12H  . tamoxifen  20 mg Oral Daily  . verapamil  240 mg Oral Daily    Continuous Infusions:   Assessment/Plan:  1 Acute on Chronic Respiratory  Failure secondary to COPD, Left sided Pneumonia. Hx of left Lung ca  On Levaquin Pending Sputum cultures. Continue SVN's, O2  and IV Solumedrol. Await Oncology input  2 HTN: On Losartan hctz- hold hctz given arf 3 Hypothyroidism:On Levothyroxine 4 Acute on Chronic Renal failure: Continue to monitor. Continue solumedrol to 40 mg qd  Gentle IVF - hold hctz  5 Chest pain: Troponin negative. Continue pain management  6 Physical Therapy    Code Status: DNR  Family Communication: Daughter      [name], Whitney Wright   08/24/2014, 12:18 PM  LOS: 3 days

## 2014-08-24 NOTE — Plan of Care (Addendum)
Problem: Discharge Progression Outcomes Goal: Other Discharge Outcomes/Goals Outcome: Progressing Pt remains on 2L O2 [place]. C/o pain in left chest, percocet given. Pt stated relief. Started on IVF today.  Afebrile.

## 2014-08-25 LAB — BASIC METABOLIC PANEL
Anion gap: 9 (ref 5–15)
BUN: 94 mg/dL — AB (ref 6–20)
CHLORIDE: 105 mmol/L (ref 101–111)
CO2: 24 mmol/L (ref 22–32)
Calcium: 8.5 mg/dL — ABNORMAL LOW (ref 8.9–10.3)
Creatinine, Ser: 2.08 mg/dL — ABNORMAL HIGH (ref 0.44–1.00)
GFR calc Af Amer: 25 mL/min — ABNORMAL LOW (ref >60)
GFR calc non Af Amer: 22 mL/min — ABNORMAL LOW (ref >60)
Glucose, Bld: 109 mg/dL — ABNORMAL HIGH (ref 65–99)
POTASSIUM: 4.4 mmol/L (ref 3.5–5.1)
SODIUM: 138 mmol/L (ref 135–145)

## 2014-08-25 LAB — GLUCOSE, CAPILLARY
GLUCOSE-CAPILLARY: 69 mg/dL (ref 65–99)
GLUCOSE-CAPILLARY: 181 mg/dL — AB (ref 65–99)
Glucose-Capillary: 96 mg/dL (ref 65–99)
Glucose-Capillary: 255 mg/dL — ABNORMAL HIGH (ref 65–99)
Glucose-Capillary: 244 mg/dL — ABNORMAL HIGH (ref 65–99)

## 2014-08-25 MED ORDER — SODIUM CHLORIDE 0.9 % IV SOLN
INTRAVENOUS | Status: AC
  Administered 2014-08-25: 14:00:00 via INTRAVENOUS

## 2014-08-25 MED ORDER — METHYLPREDNISOLONE SODIUM SUCC 40 MG IJ SOLR
30.0000 mg | INTRAMUSCULAR | Status: DC
  Administered 2014-08-26: 09:00:00 via INTRAVENOUS
  Administered 2014-08-27: 30 mg via INTRAVENOUS
  Filled 2014-08-25 (×2): qty 1

## 2014-08-25 MED ORDER — HYDRALAZINE HCL 25 MG PO TABS
50.0000 mg | ORAL_TABLET | Freq: Two times a day (BID) | ORAL | Status: DC
  Administered 2014-08-25 – 2014-08-29 (×8): 50 mg via ORAL
  Filled 2014-08-25 (×8): qty 2

## 2014-08-25 NOTE — Plan of Care (Signed)
Problem: Discharge Progression Outcomes Goal: Discharge plan in place and appropriate Outcome: Progressing Patient prefers to be called Whitney Wright. Patient lives at home alone. Patient is on 2L-O2 at home. Patient has a history of Lung cancer, Advanced COPD with Chronic Respiratory Failure, Type 2 DM, HTN and Hypothyroidism - controlled by home medications.   Goal: Other Discharge Outcomes/Goals Plan of care progress to goal O2 at 2 liters via nasal cannula with sats around 94%. Dyspneic with exertion.  Pain control- Percocet given once for c/o chest pain that worsens with a cough. Hemodynamically stable: VSS. Activity: Up to [facility] with one person assist.  Tums given once for c/o heartburn with relief of symptoms.  Pt for possible discharge home today.

## 2014-08-25 NOTE — Plan of Care (Signed)
Problem: Discharge Progression Outcomes Goal: Discharge plan in place and appropriate Individualization of Care PT PREFERS TO BE CALLED Whitney Wright HAS HISTORY OF Ca lung,Advanced COPD with Chronic Respiratory Failure,Type 2 DM,HTN, Hypothyroidism CONTROLLED WITH HOME MEDS HIGH FALL PRECAUTIONS Goal: Other Discharge Outcomes/Goals Plan of Care Progress to Goal:  PT CONTINUES TO BE SOB WITH EXERTION ON O2 AT 2 LPM, PT WITHOUT C/O PAIN, RESTARTED ON IV FLUIDS PER ORDERS PT TOLERATING WELL AT THIS TIME, REMAINS ON IV SOLUMEDROL

## 2014-08-25 NOTE — Progress Notes (Signed)
PROGRESS NOTE  Whitney Wright GEZ:[id number] DOB: 09-24-1940 DOA: 08/21/2014 PCP: Whitney Harrier, MD  Subjective: Slow improvement - less CP.  Cough - not bad   Consultants: Oncology  Objective: BP 160/70 mmHg  Pulse 74  Temp(Src) 98.4 F (36.9 C) (Oral)  Resp 24  Ht 5' (1.524 m)  Wt 70.308 kg (155 lb)  BMI 30.27 kg/m2  SpO2 95%  Intake/Output Summary (Last 24 hours) at 08/25/14 1213 Last data filed at 08/25/14 1051  Gross per 24 hour  Intake   1556 ml  Output   1350 ml  Net    206 ml   Filed Weights   08/21/14 1528 08/21/14 1810  Weight: 70.478 kg (155 lb 6 oz) 70.308 kg (155 lb)    Exam:        HEENT: [place] AT   General: In mild distress  Cardiovascular: S1 S2. Left sided chest wall tenderness +  Respiratory: Rhonchi +  Abdomen: Soft Non tender  Neuro:Non Focal  Data Reviewed: Basic Metabolic Panel:  Recent Labs Lab 08/21/14 1802 08/22/14 0726 08/23/14 0611 08/24/14 0456 08/25/14 0517  NA 139 137 134* 135 138  K 4.5 4.6 4.5 4.4 4.4  CL 103 103 99* 102 105  CO2 '25 27 26 25 24  '$ GLUCOSE 341* 304* 256* 174* 109*  BUN 58* 63* 77* 85* 94*  CREATININE 1.96* 1.91* 2.10* 2.13* 2.08*  CALCIUM 8.5* 8.3* 8.5* 8.6* 8.5*   Liver Function Tests:  Recent Labs Lab 08/21/14 1802 08/22/14 0726  AST 21 16  ALT 25 24  ALKPHOS 85 71  BILITOT 0.3 0.4  PROT 5.5* 5.2*  ALBUMIN 2.4* 2.2*   No results for input(s): LIPASE, AMYLASE in the last 168 hours. No results for input(s): AMMONIA in the last 168 hours. CBC:  Recent Labs Lab 08/21/14 1802 08/22/14 0726 08/23/14 0611 08/24/14 0456  WBC 13.2* 8.2 10.9 15.2*  NEUTROABS 11.9*  --  10.2* 13.3*  HGB 11.4* 10.6* 11.5* 10.5*  HCT 35.7 33.5* 36.2 33.4*  MCV 84.2 84.3 83.9 83.1  PLT 203 170 182 159   Cardiac Enzymes:    Recent Labs Lab 08/21/14 1802 08/21/14 2357 08/22/14 0726  TROPONINI <0.03 <0.03 <0.03   BNP (last 3 results)  Recent Labs  08/17/14 1006  BNP 390.0*    ProBNP  (last 3 results) No results for input(s): PROBNP in the last 8760 hours.  CBG:  Recent Labs Lab 08/24/14 1612 08/24/14 2148 08/25/14 0722 08/25/14 0810 08/25/14 1114  GLUCAP 260* 225* 69 96 181*    Recent Results (from the past 240 hour(s))  Culture, sputum-assessment     Status: None   Collection Time: 08/22/14  9:43 AM  Result Value Ref Range Status   Specimen Description EXPECTORATED SPUTUM  Final   Special Requests NONE  Final   Sputum evaluation THIS SPECIMEN IS ACCEPTABLE FOR SPUTUM CULTURE  Final   Report Status 08/22/2014 FINAL  Final  Culture, respiratory (NON-Expectorated)     Status: None (Preliminary result)   Collection Time: 08/22/14  9:43 AM  Result Value Ref Range Status   Specimen Description EXPECTORATED SPUTUM  Final   Special Requests NONE Reflexed from [id number]  Final   Gram Stain   Final    MANY WBC SEEN MODERATE GRAM POSITIVE COCCI MODERATE GRAM NEGATIVE RODS    Culture HOLDING FOR POSSIBLE PATHOGEN  Final   Report Status PENDING  Incomplete     Studies: No results found.  Scheduled Meds: . aspirin EC  81 mg Oral Daily  . calcium carbonate  500 mg Oral BID WC  . chlorpheniramine-HYDROcodone  5 mL Oral BID  . heparin  5,000 Units Subcutaneous 3 times per day  . hydrALAZINE  50 mg Oral Daily  . insulin aspart  0-15 Units Subcutaneous TID WC  . insulin aspart  0-5 Units Subcutaneous QHS  . insulin glargine  18 Units Subcutaneous BID  . ipratropium-albuterol  3 mL Nebulization TID  . levofloxacin (LEVAQUIN) IV  250 mg Intravenous Q24H  . levothyroxine  100 mcg Oral QAC breakfast  . levothyroxine  88 mcg Oral QAC breakfast  . methylPREDNISolone (SOLU-MEDROL) injection  40 mg Intravenous Q24H  . pravastatin  20 mg Oral q1800  . sodium chloride  3 mL Intravenous Q12H  . tamoxifen  20 mg Oral Daily  . verapamil  240 mg Oral Daily   Assessment/Plan:  1 Acute on Chronic Respiratory Failure secondary to COPD, Left sided Pneumonia. Hx of left  Lung ca  On Levaquin Pending Sputum cultures - holding for possible pathogen. Continue SVN's, O2  and IV Solumedrol. Await Oncology input - I have asked unit clerk to recall  2 HTN: On Losartan hctz- hold hctz given arf 3 Hypothyroidism:On Levothyroxine 4 Acute on Chronic Renal failure: Continue to monitor. Slight improvement in CR but BUN way up (may be due to steroids) Decrease solumedrol to 40 mg qd today  Cont gentle IVF - holding  hctz 5 Chest pain: Troponin negative. Continue pain management  6 Physical Therapy consult - oob to chair Code Status: DNR  Family Communication: Daughter      Whitney Wright, Whitney Wright   08/25/2014, 12:13 PM  LOS: 4 days

## 2014-08-25 NOTE — Progress Notes (Signed)
Patients blood pressure elevated, patient reports taking hydralzine BID at home, only on it once daily here blood pressure has been running high at night time. Spoke to dr fitzgerald ok to give hydralizine 50 mg BID

## 2014-08-26 ENCOUNTER — Inpatient Hospital Stay: Payer: Commercial Managed Care - HMO

## 2014-08-26 DIAGNOSIS — Z79899 Other long term (current) drug therapy: Secondary | ICD-10-CM

## 2014-08-26 DIAGNOSIS — Z794 Long term (current) use of insulin: Secondary | ICD-10-CM

## 2014-08-26 DIAGNOSIS — R5383 Other fatigue: Secondary | ICD-10-CM

## 2014-08-26 DIAGNOSIS — J449 Chronic obstructive pulmonary disease, unspecified: Secondary | ICD-10-CM

## 2014-08-26 DIAGNOSIS — C3432 Malignant neoplasm of lower lobe, left bronchus or lung: Secondary | ICD-10-CM

## 2014-08-26 DIAGNOSIS — J9620 Acute and chronic respiratory failure, unspecified whether with hypoxia or hypercapnia: Secondary | ICD-10-CM

## 2014-08-26 DIAGNOSIS — Z7982 Long term (current) use of aspirin: Secondary | ICD-10-CM

## 2014-08-26 DIAGNOSIS — Z923 Personal history of irradiation: Secondary | ICD-10-CM

## 2014-08-26 LAB — BASIC METABOLIC PANEL
ANION GAP: 8 (ref 5–15)
BUN: 84 mg/dL — ABNORMAL HIGH (ref 6–20)
CALCIUM: 8.5 mg/dL — AB (ref 8.9–10.3)
CO2: 22 mmol/L (ref 22–32)
Chloride: 108 mmol/L (ref 101–111)
Creatinine, Ser: 1.78 mg/dL — ABNORMAL HIGH (ref 0.44–1.00)
GFR calc Af Amer: 31 mL/min — ABNORMAL LOW (ref >60)
GFR, EST NON AFRICAN AMERICAN: 27 mL/min — AB (ref >60)
Glucose, Bld: 103 mg/dL — ABNORMAL HIGH (ref 65–99)
Potassium: 4.3 mmol/L (ref 3.5–5.1)
SODIUM: 138 mmol/L (ref 135–145)

## 2014-08-26 LAB — GLUCOSE, CAPILLARY
GLUCOSE-CAPILLARY: 77 mg/dL (ref 65–99)
GLUCOSE-CAPILLARY: 244 mg/dL — AB (ref 65–99)
GLUCOSE-CAPILLARY: 236 mg/dL — AB (ref 65–99)
Glucose-Capillary: 326 mg/dL — ABNORMAL HIGH (ref 65–99)

## 2014-08-26 LAB — CBC
HCT: 34.5 % — ABNORMAL LOW (ref 35.0–47.0)
Hemoglobin: 11 g/dL — ABNORMAL LOW (ref 12.0–16.0)
MCH: 26.4 pg (ref 26.0–34.0)
MCHC: 31.9 g/dL — ABNORMAL LOW (ref 32.0–36.0)
MCV: 82.7 fL (ref 80.0–100.0)
PLATELETS: 148 10*3/uL — AB (ref 150–440)
RBC: 4.17 MIL/uL (ref 3.80–5.20)
RDW: 15.6 % — ABNORMAL HIGH (ref 11.5–14.5)
WBC: 17.3 10*3/uL — AB (ref 3.6–11.0)

## 2014-08-26 MED ORDER — HYDROCHLOROTHIAZIDE 25 MG PO TABS
25.0000 mg | ORAL_TABLET | Freq: Every day | ORAL | Status: DC
  Administered 2014-08-26 – 2014-08-28 (×3): 25 mg via ORAL
  Filled 2014-08-26 (×3): qty 1

## 2014-08-26 MED ORDER — PIPERACILLIN-TAZOBACTAM 3.375 G IVPB
3.3750 g | Freq: Three times a day (TID) | INTRAVENOUS | Status: DC
  Administered 2014-08-26 – 2014-08-28 (×6): 3.375 g via INTRAVENOUS
  Filled 2014-08-26 (×7): qty 50

## 2014-08-26 NOTE — Evaluation (Signed)
Physical Therapy Evaluation Patient Details Name: Whitney Wright MRN: [id number] DOB: 23-Jun-1940 Today's Date: 08/26/2014   History of Present Illness  Pt is a 74 year old female admitted with chronic respiratory failure, COPD and lung cancer.   Clinical Impression  Pt evaluated by PT today is a 74 year old female with history of HTN, COPD, lung cancer, breast cancer, senile osteoporosis, DM, and thyroid disease. Examination revealed that pt performs bed mobility and transfers at min assist for lifting support due to her weakness. Pt ambulates on 2L O2 at [facility] for safety with no assistive device for 10 ft before fatiguing. Pt's main physical deficits include global weakness and decreased endurance. Pt will benefit from skilled PT to address these deficits in order for her to return to her home and function safely within it. The recommendation of SNF was discussed with the pt and she agrees with the recommendation.     Follow Up Recommendations SNF    Equipment Recommendations       Recommendations for Other Services       Precautions / Restrictions Precautions Precautions: Fall Restrictions Weight Bearing Restrictions: No      Mobility  Bed Mobility Overal bed mobility: Needs Assistance Bed Mobility: Supine to Sit     Supine to sit: Min assist     General bed mobility comments: Requires min assist for lifting support.   Transfers Overall transfer level: Needs assistance Equipment used: None Transfers: Sit to/from Stand Sit to Stand: Min assist         General transfer comment: Pt able to transfer with min assist for lifting support. Once standing, she is stable.   Ambulation/Gait Ambulation/Gait assistance: Min guard Ambulation Distance (Feet): 10 Feet Assistive device: None Gait Pattern/deviations: WFL(Within Functional Limits) Gait velocity: Decreased   General Gait Details: Pt ambulates 10 ft at [facility] for safety before fatiguing and needing to turn  around. It should be noted that pt likely would benefit from RW during ambulation secondary to rate of fatigue and slowed gait velocity. Pt requires cues to continue to walk as well as changing directions.    Stairs            Wheelchair Mobility    Modified Rankin (Stroke Patients Only)       Balance Overall balance assessment: No apparent balance deficits (not formally assessed)                                           Pertinent Vitals/Pain Pain Assessment: 0-10 Pain Score: 4  Pain Location: left lower ribs    Home Living Family/patient expects to be discharged to:: Private residence Living Arrangements: Alone Available Help at Discharge: Friend(s);Neighbor (Not reliable/ Not 24 hr assistance. ) Type of Home: Apartment Home Access: Stairs to enter;Ramped entrance Entrance Stairs-Rails: Can reach both Entrance Stairs-Number of Steps: 4 Home Layout: One level Home Equipment: None      Prior Function Level of Independence: Independent               Hand Dominance        Extremity/Trunk Assessment   Upper Extremity Assessment: Generalized weakness           Lower Extremity Assessment: Generalized weakness         Communication   Communication: No difficulties  Cognition Arousal/Alertness: Awake/alert Behavior During Therapy: WFL for tasks assessed/performed Overall Cognitive  Status: Within Functional Limits for tasks assessed                      General Comments      Exercises        Assessment/Plan    PT Assessment Patient needs continued PT services  PT Diagnosis Difficulty walking;Generalized weakness;Acute pain   PT Problem List Decreased strength;Decreased activity tolerance;Decreased mobility;Cardiopulmonary status limiting activity  PT Treatment Interventions DME instruction;Gait training;Stair training;Functional mobility training;Therapeutic activities;Therapeutic exercise;Balance  training;Patient/family education;Neuromuscular re-education   PT Goals (Current goals can be found in the Care Plan section) Acute Rehab PT Goals Patient Stated Goal: To return wherever she is safest PT Goal Formulation: With patient Potential to Achieve Goals: Good    Frequency Min 2X/week   Barriers to discharge Decreased caregiver support Pt states she has neighbors that would be willing to help, but they would not be able to be present at all times.     Co-evaluation               End of Session Equipment Utilized During Treatment: Gait belt;Oxygen Activity Tolerance: Patient tolerated treatment well Patient left: in bed;with nursing/sitter in room (Pt was handed off to nursing for transport to X-ray) Nurse Communication: Mobility status         Time: 9935-7017 PT Time Calculation (min) (ACUTE ONLY): 17 min   Charges:   PT Evaluation $Initial PT Evaluation Tier I: 1 Procedure     PT G CodesJanyth Contes Sep 08, 2014, 11:58 AM  Janyth Contes, SPT. [phone]

## 2014-08-26 NOTE — Evaluation (Signed)
Physical Therapy Evaluation Patient Details Name: Whitney Wright MRN: [id number] DOB: [birth year] Today's Date: 08/26/2014   History of Present Illness  Pt is a 74 year old female admitted with chronic respiratory failure, COPD and lung cancer.   Clinical Impression  Pt evaluated by PT today is a 74 year old female with history of HTN, COPD, lung cancer, breast cancer, senile osteoporosis, DM, and thyroid disease. Examination revealed that pt performs bed mobility and transfers at min assist for lifting support due to her weakness. Pt ambulates at [facility] for safety with no assistive device for 10 ft before fatiguing. Pt's main physical deficits include global weakness and decreased endurance. Pt will benefit from skilled PT to address these deficits in order for her to return to her home and function safely within it. The recommendation of SNF was discussed with the pt and she agrees with the recommendation.     Follow Up Recommendations SNF    Equipment Recommendations       Recommendations for Other Services       Precautions / Restrictions Precautions Precautions: Fall Restrictions Weight Bearing Restrictions: No      Mobility  Bed Mobility Overal bed mobility: Needs Assistance Bed Mobility: Supine to Sit     Supine to sit: Min assist     General bed mobility comments: Requires min assist for lifting support.   Transfers Overall transfer level: Needs assistance Equipment used: None Transfers: Sit to/from Stand Sit to Stand: Min assist         General transfer comment: Pt able to transfer with min assist for lifting support. Once standing, she is stable.   Ambulation/Gait Ambulation/Gait assistance: Min guard Ambulation Distance (Feet): 10 Feet Assistive device: None Gait Pattern/deviations: WFL(Within Functional Limits) Gait velocity: Decreased   General Gait Details: Pt ambulates 10 ft at [facility] for safety before fatiguing and needing to turn around.    Stairs            Wheelchair Mobility    Modified Rankin (Stroke Patients Only)       Balance Overall balance assessment: No apparent balance deficits (not formally assessed)                                           Pertinent Vitals/Pain Pain Assessment: 0-10 Pain Score: 4  Pain Location: left lower ribs    Home Living Family/patient expects to be discharged to:: Private residence Living Arrangements: Alone Available Help at Discharge: Friend(s);Neighbor (Not reliable/ Not 24 hr assistance. ) Type of Home: Apartment Home Access: Stairs to enter;Ramped entrance Entrance Stairs-Rails: Can reach both Entrance Stairs-Number of Steps: 4 Home Layout: One level Home Equipment: None      Prior Function Level of Independence: Independent               Hand Dominance        Extremity/Trunk Assessment   Upper Extremity Assessment: Generalized weakness           Lower Extremity Assessment: Generalized weakness         Communication   Communication: No difficulties  Cognition Arousal/Alertness: Awake/alert Behavior During Therapy: WFL for tasks assessed/performed Overall Cognitive Status: Within Functional Limits for tasks assessed                      General Comments      Exercises  Assessment/Plan    PT Assessment Patient needs continued PT services  PT Diagnosis Difficulty walking;Generalized weakness;Acute pain   PT Problem List Decreased strength;Decreased activity tolerance;Decreased mobility;Cardiopulmonary status limiting activity  PT Treatment Interventions DME instruction;Gait training;Stair training;Functional mobility training;Therapeutic activities;Therapeutic exercise;Balance training;Patient/family education;Neuromuscular re-education   PT Goals (Current goals can be found in the Care Plan section) Acute Rehab PT Goals Patient Stated Goal: To return wherever she is safest PT Goal  Formulation: With patient Potential to Achieve Goals: Good    Frequency Min 2X/week   Barriers to discharge Decreased caregiver support Pt states she has neighbors that would be willing to help, but they would not be able to be present at all times.     Co-evaluation               End of Session Equipment Utilized During Treatment: Gait belt;Oxygen Activity Tolerance: Patient tolerated treatment well Patient left: in bed;with nursing/sitter in room (Pt was handed off to nursing for transport to X-ray) Nurse Communication: Mobility status         Time: 8721-5872 PT Time Calculation (min) (ACUTE ONLY): 17 min   Charges:         PT G CodesJanyth Contes 09-14-2014, 11:35 AM  Janyth Contes, SPT. [phone]

## 2014-08-26 NOTE — Care Management (Signed)
Physical therapy evaluation completed. Recommends skilled nursing facility. Will update Clinical Education officer, museum. Shelbie Ammons RN MSN Care Management [phone]

## 2014-08-26 NOTE — Progress Notes (Signed)
[facility] Hematology/Oncology Progress Note  Date of admission: 08/21/2014  Hospital day:  08/26/2014  Chief Complaint: Whitney Wright is a 74 y.o. female with recurrent non-small cell lung cancer who was admitted with acute on chronic respiratory failure secondary to COPD and left lung consolidation.  Subjective: Patient feels a bit better today.  Until recently, she was not out of bed, except to use the bedside commode.  She is now able to walk a few steps with physical therapy to the lounge chair in the room before getting short of breath.  Social History: The patient is alone today.  She states that she lives at home alone.  Prior to admission, she was able to care for herself.  Allergies:  Allergies  Allergen Reactions  . Macrodantin [Nitrofurantoin] Rash    Scheduled Medications: . aspirin EC  81 mg Oral Daily  . calcium carbonate  500 mg Oral BID WC  . chlorpheniramine-HYDROcodone  5 mL Oral BID  . heparin  5,000 Units Subcutaneous 3 times per day  . hydrALAZINE  50 mg Oral BID  . hydrochlorothiazide  25 mg Oral Daily  . insulin aspart  0-15 Units Subcutaneous TID WC  . insulin aspart  0-5 Units Subcutaneous QHS  . insulin glargine  18 Units Subcutaneous BID  . ipratropium-albuterol  3 mL Nebulization TID  . levothyroxine  100 mcg Oral QAC breakfast  . levothyroxine  88 mcg Oral QAC breakfast  . methylPREDNISolone (SOLU-MEDROL) injection  30 mg Intravenous Q24H  . piperacillin-tazobactam (ZOSYN)  IV  3.375 g Intravenous 3 times per day  . pravastatin  20 mg Oral q1800  . sodium chloride  3 mL Intravenous Q12H  . tamoxifen  20 mg Oral Daily  . verapamil  240 mg Oral Daily    Review of Systems: GENERAL:  Weak and fatigued.  No fevers, sweats or weight loss. PERFORMANCE STATUS (ECOG):  3 HEENT:  No visual changes, runny nose, sore throat, mouth sores or tenderness. Lungs: Shortness of breath (limiting)  Cough.  Left sided chest pain  associated with pneumonia, somewhat pleuritic in nature.  No hemoptysis. Cardiac:  No cardiac chest pain, palpitations, orthopnea, or PND. GI:  No nausea, vomiting, diarrhea, constipation, melena or hematochezia. GU:  No urgency, frequency, dysuria, or hematuria. Musculoskeletal:  Left side pain has decreased in extent and amount.  FOcal area of discomfort mid axillary line 1/2 way down ribs.  Pain is "on the inside".  No back pain.  No joint pain.  No muscle tenderness. Extremities:  No pain or swelling. Skin:  No rashes or skin changes. Neuro:  No headache, numbness or weakness, balance or coordination issues. Endocrine:  No diabetes, thyroid issues, hot flashes or night sweats. Psych:  No mood changes, depression or anxiety. Pain:  Focal left lateral chest pain. Review of systems:  All other systems reviewed and found to be negative.  Physical Exam: Blood pressure 154/56, pulse 73, temperature 98.2 F (36.8 C), temperature source Oral, resp. rate 20, height 5' (1.524 m), weight 155 lb (70.308 kg), SpO2 97 %.  GENERAL:  Chronically ill appearing woman sitting comfortably in the exam room in no acute distress. MENTAL STATUS:  Alert and oriented to person, place and time. HEAD:  Short gray hair.  Normocephalic, atraumatic, face symmetric, no Cushingoid features. EYES:  Blue eyes.  Pupils equal round and reactive to light and accomodation.  No conjunctivitis or scleral icterus. ENT:  Oropharynx clear without lesion.  Tongue normal. Mucous  membranes moist.  RESPIRATORY:  Scattered wheezes.  Rhonchi left chest.  Rub laterally at area of discomfort.  CARDIOVASCULAR:  Regular rate and rhythm without murmur, rub or gallop. ABDOMEN:  Soft, non-tender, with active bowel sounds, and no hepatosplenomegaly.  No masses. SKIN:  No rashes, ulcers or lesions. EXTREMITIES: No edema, no skin discoloration or tenderness.  No palpable cords. LYMPH NODES: No palpable cervical, supraclavicular, axillary or  inguinal adenopathy  NEUROLOGICAL: Unremarkable. PSYCH:  Appropriate.  Results for orders placed or performed during the hospital encounter of 08/21/14 (from the past 48 hour(s))  Glucose, capillary     Status: Abnormal   Collection Time: 08/24/14  9:48 PM  Result Value Ref Range   Glucose-Capillary 225 (H) 65 - 99 mg/dL  Basic metabolic panel     Status: Abnormal   Collection Time: 08/25/14  5:17 AM  Result Value Ref Range   Sodium 138 135 - 145 mmol/L   Potassium 4.4 3.5 - 5.1 mmol/L   Chloride 105 101 - 111 mmol/L   CO2 24 22 - 32 mmol/L   Glucose, Bld 109 (H) 65 - 99 mg/dL   BUN 94 (H) 6 - 20 mg/dL   Creatinine, Ser 2.08 (H) 0.44 - 1.00 mg/dL   Calcium 8.5 (L) 8.9 - 10.3 mg/dL   GFR calc non Af Amer 22 (L) >60 mL/min   GFR calc Af Amer 25 (L) >60 mL/min    Comment: (NOTE) The eGFR has been calculated using the CKD EPI equation. This calculation has not been validated in all clinical situations. eGFR's persistently <60 mL/min signify possible Chronic Kidney Disease.    Anion gap 9 5 - 15  Glucose, capillary     Status: None   Collection Time: 08/25/14  7:22 AM  Result Value Ref Range   Glucose-Capillary 69 65 - 99 mg/dL   Comment 1 Notify RN   Glucose, capillary     Status: None   Collection Time: 08/25/14  8:10 AM  Result Value Ref Range   Glucose-Capillary 96 65 - 99 mg/dL  Glucose, capillary     Status: Abnormal   Collection Time: 08/25/14 11:14 AM  Result Value Ref Range   Glucose-Capillary 181 (H) 65 - 99 mg/dL   Comment 1 Notify RN   Glucose, capillary     Status: Abnormal   Collection Time: 08/25/14  4:01 PM  Result Value Ref Range   Glucose-Capillary 255 (H) 65 - 99 mg/dL   Comment 1 Notify RN   Glucose, capillary     Status: Abnormal   Collection Time: 08/25/14  9:43 PM  Result Value Ref Range   Glucose-Capillary 244 (H) 65 - 99 mg/dL  CBC     Status: Abnormal   Collection Time: 08/26/14  5:26 AM  Result Value Ref Range   WBC 17.3 (H) 3.6 - 11.0 K/uL    RBC 4.17 3.80 - 5.20 MIL/uL   Hemoglobin 11.0 (L) 12.0 - 16.0 g/dL   HCT 34.5 (L) 35.0 - 47.0 %   MCV 82.7 80.0 - 100.0 fL   MCH 26.4 26.0 - 34.0 pg   MCHC 31.9 (L) 32.0 - 36.0 g/dL   RDW 15.6 (H) 11.5 - 14.5 %   Platelets 148 (L) 150 - 440 K/uL  Basic metabolic panel     Status: Abnormal   Collection Time: 08/26/14  5:26 AM  Result Value Ref Range   Sodium 138 135 - 145 mmol/L   Potassium 4.3 3.5 - 5.1 mmol/L  Chloride 108 101 - 111 mmol/L   CO2 22 22 - 32 mmol/L   Glucose, Bld 103 (H) 65 - 99 mg/dL   BUN 84 (H) 6 - 20 mg/dL   Creatinine, Ser 1.78 (H) 0.44 - 1.00 mg/dL   Calcium 8.5 (L) 8.9 - 10.3 mg/dL   GFR calc non Af Amer 27 (L) >60 mL/min   GFR calc Af Amer 31 (L) >60 mL/min    Comment: (NOTE) The eGFR has been calculated using the CKD EPI equation. This calculation has not been validated in all clinical situations. eGFR's persistently <60 mL/min signify possible Chronic Kidney Disease.    Anion gap 8 5 - 15  Glucose, capillary     Status: None   Collection Time: 08/26/14  7:37 AM  Result Value Ref Range   Glucose-Capillary 77 65 - 99 mg/dL  Glucose, capillary     Status: Abnormal   Collection Time: 08/26/14 11:16 AM  Result Value Ref Range   Glucose-Capillary 244 (H) 65 - 99 mg/dL  Glucose, capillary     Status: Abnormal   Collection Time: 08/26/14  4:05 PM  Result Value Ref Range   Glucose-Capillary 326 (H) 65 - 99 mg/dL   Dg Chest 2 View  08/26/2014   CLINICAL DATA:  Pneumonia. History of COPD, lung cancer and prior radiation.  EXAM: CHEST  2 VIEW  COMPARISON:  08/21/2014  FINDINGS: Consolidation in the left lower lobe and lingula compatible with pneumonia. Mass in the medial right upper hemithorax again noted, unchanged. Heart is upper limits normal in size. No visible effusions or acute bony abnormality.  IMPRESSION: Stable left lower lobe airspace opacity concerning for pneumonia.  Stable medial right upper chest paraspinal mass.   Electronically Signed   By:  Rolm Baptise M.D.   On: 08/26/2014 10:06    Assessment:  Whitney Wright is a 74 y.o. female with a history of adenocarcinoma of the lung diagnosed 03/2013.  She completed stereotactic radiation on 04/2013.  PET scan on 01/15/2014 revealed progressive enlargement of the left lower lobe nodule.  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  She received stereotactic radiation to a second lung location (completed 03/07/2014).  She did not receive chemotherapy secondary to borderline performance status and her reluctance to take chemotherapy.  Chest CT scan on 06/24/2014 was stable.  Chest CT on 08/17/2014 revealed slightly increased consolidation in the left lower lobe.  She is slowly improving on antibiotics.  Her pain is localized in the left lateral chest at the site of consolidation and is decreasing in intensity and size.  Plan: 1. Hematology/Oncology:  No current evidence of progressive disease.  Plan for outpatient CT scan in follow-up.  Counts adequate.  Reactive leukocytosis. 2. Infectious disease:  Left sided pneumonia on Levaquin.  Sputum cultures pending.   3. Nephrology:  Creatinine clearance 27 ml/min.  Gentle hydration. 4. Pain/Toxicology:  Pain medications as needed for apparent pleuritis associated with pneumonia. Would avoid NSAIDs as patient with renal insufficiency. 5. Disposition:  Anticipate discharge to skilled nursing facility when able.  Thank you for allowing me to participate in Ms Nicklin's care.  Dr. Ma Hillock will be back tomorrow.  Lequita Asal, MD  08/26/2014, 5:29 PM

## 2014-08-26 NOTE — Progress Notes (Signed)
ANTIBIOTIC CONSULT NOTE - INITIAL  Pharmacy Consult for Zosyn Indication: aspiration pneumonia  Allergies  Allergen Reactions  . Macrodantin [Nitrofurantoin] Rash    Patient Measurements: Height: 5' (152.4 cm) Weight: 155 lb (70.308 kg) IBW/kg (Calculated) : 45.5   Vital Signs: Temp: 97.8 F (36.6 C) (06/27 0541) Temp Source: Oral (06/27 0541) BP: 187/72 mmHg (06/27 0754) Pulse Rate: 83 (06/27 0541) Intake/Output from previous day: 06/26 0701 - 06/27 0700 In: 7510 [P.O.:720; I.V.:308] Out: 1300 [Urine:1300] Intake/Output from this shift: Total I/O In: 480 [P.O.:480] Out: 250 [Urine:250]  Labs:  Recent Labs  08/24/14 0456 08/25/14 0517 08/26/14 0526  WBC 15.2*  --  17.3*  HGB 10.5*  --  11.0*  PLT 159  --  148*  CREATININE 2.13* 2.08* 1.78*   Estimated Creatinine Clearance: 23.5 mL/min (by C-G formula based on Cr of 1.78). No results for input(s): VANCOTROUGH, VANCOPEAK, VANCORANDOM, GENTTROUGH, GENTPEAK, GENTRANDOM, TOBRATROUGH, TOBRAPEAK, TOBRARND, AMIKACINPEAK, AMIKACINTROU, AMIKACIN in the last 72 hours.   Microbiology: Recent Results (from the past 720 hour(s))  Culture, sputum-assessment     Status: None   Collection Time: 08/22/14  9:43 AM  Result Value Ref Range Status   Specimen Description EXPECTORATED SPUTUM  Final   Special Requests NONE  Final   Sputum evaluation THIS SPECIMEN IS ACCEPTABLE FOR SPUTUM CULTURE  Final   Report Status 08/22/2014 FINAL  Final  Culture, respiratory (NON-Expectorated)     Status: None (Preliminary result)   Collection Time: 08/22/14  9:43 AM  Result Value Ref Range Status   Specimen Description EXPECTORATED SPUTUM  Final   Special Requests NONE Reflexed from [id number]  Final   Gram Stain   Final    MANY WBC SEEN MODERATE GRAM POSITIVE COCCI MODERATE GRAM NEGATIVE RODS    Culture   Final    LIGHT GROWTH GRAM NEGATIVE RODS RARE MOLD REFERRED TO [facility] IN [place], [place] FOR  IDENTIFICATION/CONFIRMATION IDENTIFICATION AND SUSCEPTIBILITIES TO FOLLOW FOR GRAM NEGATIVE ROD    Report Status PENDING  Incomplete    Medical History: Past Medical History  Diagnosis Date  . Hypertension   . Cellulitis and abscess of trunk   . Thyroid disease     hypo  . Arthritis   . Personal history of malignant neoplasm of breast   . Diabetes mellitus without complication   . Senile osteoporosis   . 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 ER 50%, PR 0, HER-2/neu nonamplified.. Tamoxifen chosen as bone density showed severe osteoporosis.  . Lung cancer, upper lobe February 2015    Adenocarcinoma with lepidic pattern, second primary fall 2015  . Breast cancer     Assessment: 74yo female with hx of left lung cancer here with acute on chronic respiratory failure due to COPD and left sided PNA changing from levofloxacin to Zosyn for aspiration PNA  Goal of Therapy:  Resolution of infection  Plan:  Will order Zosyn 3.375 g IV q8h EI    HRayna Sexton PharmD, BCPS Clinical Pharmacist  08/26/2014,1:29 PM

## 2014-08-26 NOTE — Plan of Care (Signed)
Problem: Discharge Progression Outcomes Goal: Other Discharge Outcomes/Goals Plan of Care Progress to Goal:  PT CONTINUES TO BE SOB WITH EXERTION ON O2 AT 2 LPM, PT WITHOUT C/O PAIN, IV ANTIBIOTICS CHANGED PER ORDERS PT TOLERATING WELL AT THIS TIME, REMAINS ON IV SOLUMEDROL

## 2014-08-26 NOTE — Care Management (Signed)
Important Message  Patient Details  Name: Whitney Wright MRN: [id number] Date of Birth: 1941/01/03   Medicare Important Message Given:  Yes-second notification given    [name] 08/26/2014, 2:07 PM

## 2014-08-26 NOTE — Progress Notes (Signed)
PATIENTS BLOOD PRESSURE IS 210/80 MANUAL THIS MORNING AFTER ADDING ADDITIONAL DOSE OF HYDRALZINE LAST NIGHT. SPOKE TO DR [name] RE: BLOOD PRESSURE WOULD LIKE TO RESTART PATIENT ON HCTZ 25 MG STARING NOW AND ORDER FOR DAILY.

## 2014-08-26 NOTE — Progress Notes (Signed)
Initial Nutrition Assessment  INTERVENTION:  Meals and Snacks: Cater to patient preferences    NUTRITION DIAGNOSIS:  No nutrition diagnosis at this time  GOAL:  Patient will meet greater than or equal to 90% of their needs  MONITOR:   (Energy Intake, Glucose Profile, Pulmonary Profile)  REASON FOR ASSESSMENT:   (RD Screen, Length of Stay)    ASSESSMENT:  Pt admitted with acute respiratory failure secondary to pna and COPD with h/o lung cancer PMHx:  Past Medical History  Diagnosis Date  . Hypertension   . Cellulitis and abscess of trunk   . Thyroid disease     hypo  . Arthritis   . Personal history of malignant neoplasm of breast   . Diabetes mellitus without complication   . Senile osteoporosis   . 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 ER 50%, PR 0, HER-2/neu nonamplified.. Tamoxifen chosen as bone density showed severe osteoporosis.  . Lung cancer, upper lobe February 2015    Adenocarcinoma with lepidic pattern, second primary fall 2015  . Breast cancer     Diet Order: Carb Modified Fluid consistency:: Thin; Room service appropriate?: Yes  Current Nutrition: Pt reports not being able to eat everything on tray this am because 'there was just too much.' Pt reports having a good appetite since admission; per I/O chart recorded po intake 96% of meals since admission  Food/Nutrition-Related History: Pt reports healthy appetite PTA eating 2-3 meals per day   Medications: Novolog, Lantus, solumedrol,   Electrolyte/Renal Profile and Glucose Profile:   Recent Labs Lab 08/24/14 0456 08/25/14 0517 08/26/14 0526  NA 135 138 138  K 4.4 4.4 4.3  CL 102 105 108  CO2 25 24 22   BUN 85* 94* 84*  CREATININE 2.13* 2.08* 1.78*  CALCIUM 8.6* 8.5* 8.5*  GLUCOSE 174* 109* 103*   Protein Profile:  Recent Labs Lab 08/21/14 1802 08/22/14 0726   ALBUMIN 2.4* 2.2*    Gastrointestinal Profile: Last BM: 6/26   Weight Change: Pt reports stable weight PTA.  Anthropometrics: Height:  Ht Readings from Last 1 Encounters:  08/21/14 5' (1.524 m)    Weight:  Wt Readings from Last 1 Encounters:  08/21/14 155 lb (70.308 kg)    Wt Readings from Last 10 Encounters:  08/21/14 155 lb (70.308 kg)  08/17/14 155 lb (70.308 kg)  03/12/14 150 lb (68.04 kg)  03/15/13 154 lb (69.854 kg)  08/28/12 150 lb (68.04 kg)    BMI:  Body mass index is 30.27 kg/(m^2).  Skin:  Reviewed, no issues  EDUCATION NEEDS:  No education needs identified at this time   Intake/Output Summary (Last 24 hours) at 08/26/14 1326 Last data filed at 08/26/14 1300  Gross per 24 hour  Intake   1028 ml  Output   1100 ml  Net    -72 ml   LOW Care Level  ADwyane Luo RD, LDN Pager [phone]

## 2014-08-26 NOTE — Evaluation (Signed)
Physical Therapy Evaluation Patient Details Name: Whitney Wright MRN: [id number] DOB: [birth year] Today's Date: 08/26/2014   History of Present Illness  Pt is a 74 year old female admitted with chronic respiratory failure, COPD and lung cancer.   Clinical Impression  Pt evaluated by PT today is a 74 year old female with history of HTN, COPD, lung cancer, breast cancer, senile osteoporosis, DM, and thyroid disease. Examination revealed that pt performs bed mobility and transfers at min assist for lifting support due to her weakness. Pt ambulates on 2L O2 at [facility] for safety with no assistive device for 10 ft before fatiguing. Pt's main physical deficits include global weakness and decreased endurance. Pt will benefit from skilled PT to address these deficits in order for her to return to her home and function safely within it. The recommendation of SNF was discussed with the pt and she agrees with the recommendation.     Follow Up Recommendations SNF    Equipment Recommendations       Recommendations for Other Services       Precautions / Restrictions Precautions Precautions: Fall Restrictions Weight Bearing Restrictions: No      Mobility  Bed Mobility Overal bed mobility: Needs Assistance Bed Mobility: Supine to Sit     Supine to sit: Min assist     General bed mobility comments: Requires min assist for lifting support.   Transfers Overall transfer level: Needs assistance Equipment used: None Transfers: Sit to/from Stand Sit to Stand: Min assist         General transfer comment: Pt able to transfer with min assist for lifting support. Once standing, she is stable.   Ambulation/Gait Ambulation/Gait assistance: Min guard Ambulation Distance (Feet): 10 Feet Assistive device: None Gait Pattern/deviations: WFL(Within Functional Limits) Gait velocity: Decreased   General Gait Details: Pt ambulates 10 ft at [facility] for safety before fatiguing and needing to turn  around.   Stairs            Wheelchair Mobility    Modified Rankin (Stroke Patients Only)       Balance Overall balance assessment: No apparent balance deficits (not formally assessed)                                           Pertinent Vitals/Pain Pain Assessment: 0-10 Pain Score: 4  Pain Location: left lower ribs    Home Living Family/patient expects to be discharged to:: Private residence Living Arrangements: Alone Available Help at Discharge: Friend(s);Neighbor (Not reliable/ Not 24 hr assistance. ) Type of Home: Apartment Home Access: Stairs to enter;Ramped entrance Entrance Stairs-Rails: Can reach both Entrance Stairs-Number of Steps: 4 Home Layout: One level Home Equipment: None      Prior Function Level of Independence: Independent               Hand Dominance        Extremity/Trunk Assessment   Upper Extremity Assessment: Generalized weakness           Lower Extremity Assessment: Generalized weakness         Communication   Communication: No difficulties  Cognition Arousal/Alertness: Awake/alert Behavior During Therapy: WFL for tasks assessed/performed Overall Cognitive Status: Within Functional Limits for tasks assessed                      General Comments  Exercises        Assessment/Plan    PT Assessment Patient needs continued PT services  PT Diagnosis Difficulty walking;Generalized weakness;Acute pain   PT Problem List Decreased strength;Decreased activity tolerance;Decreased mobility;Cardiopulmonary status limiting activity  PT Treatment Interventions DME instruction;Gait training;Stair training;Functional mobility training;Therapeutic activities;Therapeutic exercise;Balance training;Patient/family education;Neuromuscular re-education   PT Goals (Current goals can be found in the Care Plan section) Acute Rehab PT Goals Patient Stated Goal: To return wherever she is safest PT Goal  Formulation: With patient Potential to Achieve Goals: Good    Frequency Min 2X/week   Barriers to discharge Decreased caregiver support Pt states she has neighbors that would be willing to help, but they would not be able to be present at all times.     Co-evaluation               End of Session Equipment Utilized During Treatment: Gait belt;Oxygen Activity Tolerance: Patient tolerated treatment well Patient left: in bed;with nursing/sitter in room (Pt was handed off to nursing for transport to X-ray) Nurse Communication: Mobility status         Time: 1610-9604 PT Time Calculation (min) (ACUTE ONLY): 17 min   Charges:         PT G CodesJanyth Contes September 18, 2014, 11:41 AM  Janyth Contes, SPT. [phone]

## 2014-08-26 NOTE — Plan of Care (Signed)
Problem: Discharge Progression Outcomes Goal: Other Discharge Outcomes/Goals Outcome: Progressing Plan of Care Progress to Goal:   PT CONTINUES TO BE SOB WITH EXERTION ON O2 AT 2 LPM LESS TIME REQUIRED TO RECOVER AFTER EXERCTION , PT WITHOUT C/O PAIN, ivf DISCONTINUED AT 11 PM PER MD ORDER ,

## 2014-08-26 NOTE — Progress Notes (Signed)
PROGRESS NOTE  Whitney Wright ITG:[id number] DOB: [birth year] DOA: 08/21/2014 PCP: Tracie Harrier, MD  Subjective: Slow improvement - less CP.  Cough - not bad  C/o Feeling tired  Consultants: Oncology  Objective: BP 187/72 mmHg  Pulse 83  Temp(Src) 97.8 F (36.6 C) (Oral)  Resp 18  Ht 5' (1.524 m)  Wt 70.308 kg (155 lb)  BMI 30.27 kg/m2  SpO2 98%  Intake/Output Summary (Last 24 hours) at 08/26/14 0812 Last data filed at 08/26/14 0743  Gross per 24 hour  Intake    788 ml  Output   1550 ml  Net   -762 ml   Filed Weights   08/21/14 1528 08/21/14 1810  Weight: 70.478 kg (155 lb 6 oz) 70.308 kg (155 lb)    Exam:        HEENT: [place] AT   General: In mild distress  Cardiovascular: S1 S2. Left sided chest wall tenderness +  Respiratory: Rhonchi +  Abdomen: Soft Non tender  Neuro:Non Focal  Data Reviewed: Basic Metabolic Panel:  Recent Labs Lab 08/22/14 0726 08/23/14 0611 08/24/14 0456 08/25/14 0517 08/26/14 0526  NA 137 134* 135 138 138  K 4.6 4.5 4.4 4.4 4.3  CL 103 99* 102 105 108  CO2 '27 26 25 24 22  '$ GLUCOSE 304* 256* 174* 109* 103*  BUN 63* 77* 85* 94* 84*  CREATININE 1.91* 2.10* 2.13* 2.08* 1.78*  CALCIUM 8.3* 8.5* 8.6* 8.5* 8.5*   Liver Function Tests:  Recent Labs Lab 08/21/14 1802 08/22/14 0726  AST 21 16  ALT 25 24  ALKPHOS 85 71  BILITOT 0.3 0.4  PROT 5.5* 5.2*  ALBUMIN 2.4* 2.2*   No results for input(s): LIPASE, AMYLASE in the last 168 hours. No results for input(s): AMMONIA in the last 168 hours. CBC:  Recent Labs Lab 08/21/14 1802 08/22/14 0726 08/23/14 0611 08/24/14 0456 08/26/14 0526  WBC 13.2* 8.2 10.9 15.2* 17.3*  NEUTROABS 11.9*  --  10.2* 13.3*  --   HGB 11.4* 10.6* 11.5* 10.5* 11.0*  HCT 35.7 33.5* 36.2 33.4* 34.5*  MCV 84.2 84.3 83.9 83.1 82.7  PLT 203 170 182 159 148*   Cardiac Enzymes:    Recent Labs Lab 08/21/14 1802 08/21/14 2357 08/22/14 0726  TROPONINI <0.03 <0.03 <0.03   BNP (last 3  results)  Recent Labs  08/17/14 1006  BNP 390.0*    ProBNP (last 3 results) No results for input(s): PROBNP in the last 8760 hours.  CBG:  Recent Labs Lab 08/25/14 0810 08/25/14 1114 08/25/14 1601 08/25/14 2143 08/26/14 0737  GLUCAP 96 181* 255* 244* 77    Recent Results (from the past 240 hour(s))  Culture, sputum-assessment     Status: None   Collection Time: 08/22/14  9:43 AM  Result Value Ref Range Status   Specimen Description EXPECTORATED SPUTUM  Final   Special Requests NONE  Final   Sputum evaluation THIS SPECIMEN IS ACCEPTABLE FOR SPUTUM CULTURE  Final   Report Status 08/22/2014 FINAL  Final  Culture, respiratory (NON-Expectorated)     Status: None (Preliminary result)   Collection Time: 08/22/14  9:43 AM  Result Value Ref Range Status   Specimen Description EXPECTORATED SPUTUM  Final   Special Requests NONE Reflexed from [id number]  Final   Gram Stain   Final    MANY WBC SEEN MODERATE GRAM POSITIVE COCCI MODERATE GRAM NEGATIVE RODS    Culture HOLDING FOR POSSIBLE PATHOGEN  Final   Report Status PENDING  Incomplete  Studies: No results found.  Scheduled Meds: . aspirin EC  81 mg Oral Daily  . calcium carbonate  500 mg Oral BID WC  . chlorpheniramine-HYDROcodone  5 mL Oral BID  . heparin  5,000 Units Subcutaneous 3 times per day  . hydrALAZINE  50 mg Oral BID  . hydrochlorothiazide  25 mg Oral Daily  . insulin aspart  0-15 Units Subcutaneous TID WC  . insulin aspart  0-5 Units Subcutaneous QHS  . insulin glargine  18 Units Subcutaneous BID  . ipratropium-albuterol  3 mL Nebulization TID  . levofloxacin (LEVAQUIN) IV  250 mg Intravenous Q24H  . levothyroxine  100 mcg Oral QAC breakfast  . levothyroxine  88 mcg Oral QAC breakfast  . methylPREDNISolone (SOLU-MEDROL) injection  30 mg Intravenous Q24H  . pravastatin  20 mg Oral q1800  . sodium chloride  3 mL Intravenous Q12H  . tamoxifen  20 mg Oral Daily  . verapamil  240 mg Oral Daily    Assessment/Plan:  1 Acute on Chronic Respiratory Failure secondary to COPD, Left sided Pneumonia. Hx of left Lung ca  On Levaquin Pending Sputum cultures - holding for possible pathogen. Continue SVN's, O2  and IV Solumedrol. Check CXR today 2 HTN: On Losartan hctz- hold hctz given arf 3 Hypothyroidism:On Levothyroxine 4 Acute on Chronic Renal failure: Continue to monitor. Se Creat improved to 1.78 but BUN way up (may be due to steroids) Decrease solumedrol to 40 mg qd today  Cont gentle IVF - holding  hctz 5 Chest pain: Troponin negative. Continue pain management  6 Physical Therapy consult - oob to chair Code Status: DNR  Family Communication: Daughter      [facility]   08/26/2014, 8:12 AM  LOS: 5 days

## 2014-08-26 NOTE — Progress Notes (Signed)
Inpatient Diabetes Program Recommendations  AACE/ADA: New Consensus Statement on Inpatient Glycemic Control (2013)  Target Ranges:  Prepandial:   less than 140 mg/dL      Peak postprandial:   less than 180 mg/dL (1-2 hours)      Critically ill patients:  140 - 180 mg/dL   Results for Whitney Wright, Whitney Wright (MRN [id number]) as of 08/26/2014 08:23  Ref. Range 08/25/2014 08:10 08/25/2014 11:14 08/25/2014 16:01 08/25/2014 21:43  Glucose-Capillary Latest Ref Range: 65-99 mg/dL 96 181 (H) 255 (H) 244 (H)    Results for Whitney Wright, TUEL (MRN [id number]) as of 08/26/2014 08:23  Ref. Range 08/21/2014 18:02  Hemoglobin A1C Latest Ref Range: 4.0-6.0 % 7.1 (H)    Home DM Meds: Lantus 18 units bid  Regular SSI prn for hyperglycemia  Current DM Orders: Lantus 18 units bid  Novolog Moderate SSI (0-15 units) tid ac + HS    **Note IV Solumedrol decreased to 30 mg daily today (was 40 mg daily yesterday).  **Patient eating 100% of meals.  **Having postprandial glucose elevations while on IV steroids. Well controlled prior to admission as evidenced by A1c of 7.1%.     MD- Please consider the following in-hospital insulin adjustments while patient receiving IV steroids:  1. Add Novolog Meal Coverage- Novolog 3 units tid with meals [Please use Glycemic Control Order set to order Novolog Meal Coverage]  2. Continue Current Novolog SSI   Will follow Rhyse Loux Lowella Dell RN, MSN, CDE Diabetes Coordinator Inpatient Glycemic Control Team Team Pager: [phone] (8a-5p)

## 2014-08-27 LAB — BASIC METABOLIC PANEL
Anion gap: 6 (ref 5–15)
BUN: 83 mg/dL — AB (ref 6–20)
CO2: 23 mmol/L (ref 22–32)
CREATININE: 1.97 mg/dL — AB (ref 0.44–1.00)
Calcium: 8.5 mg/dL — ABNORMAL LOW (ref 8.9–10.3)
Chloride: 110 mmol/L (ref 101–111)
GFR calc Af Amer: 27 mL/min — ABNORMAL LOW (ref >60)
GFR calc non Af Amer: 23 mL/min — ABNORMAL LOW (ref >60)
Glucose, Bld: 86 mg/dL (ref 65–99)
Potassium: 4.4 mmol/L (ref 3.5–5.1)
Sodium: 139 mmol/L (ref 135–145)

## 2014-08-27 LAB — CULTURE, RESPIRATORY W GRAM STAIN

## 2014-08-27 LAB — CBC WITH DIFFERENTIAL/PLATELET
BASOS ABS: 0.2 10*3/uL — AB (ref 0–0.1)
Basophils Relative: 1 %
Eosinophils Absolute: 0 10*3/uL (ref 0–0.7)
Eosinophils Relative: 0 %
HCT: 35.2 % (ref 35.0–47.0)
Hemoglobin: 11.2 g/dL — ABNORMAL LOW (ref 12.0–16.0)
Lymphocytes Relative: 5 %
Lymphs Abs: 0.9 10*3/uL — ABNORMAL LOW (ref 1.0–3.6)
MCH: 26.7 pg (ref 26.0–34.0)
MCHC: 31.9 g/dL — ABNORMAL LOW (ref 32.0–36.0)
MCV: 83.7 fL (ref 80.0–100.0)
MONOS PCT: 6 %
Monocytes Absolute: 1.1 10*3/uL — ABNORMAL HIGH (ref 0.2–0.9)
NEUTROS PCT: 88 %
Neutro Abs: 16.5 10*3/uL — ABNORMAL HIGH (ref 1.4–6.5)
PLATELETS: 141 10*3/uL — AB (ref 150–440)
RBC: 4.21 MIL/uL (ref 3.80–5.20)
RDW: 15.5 % — AB (ref 11.5–14.5)
WBC: 18.7 10*3/uL — ABNORMAL HIGH (ref 3.6–11.0)

## 2014-08-27 LAB — GLUCOSE, CAPILLARY
GLUCOSE-CAPILLARY: 232 mg/dL — AB (ref 65–99)
Glucose-Capillary: 71 mg/dL (ref 65–99)
Glucose-Capillary: 241 mg/dL — ABNORMAL HIGH (ref 65–99)
Glucose-Capillary: 258 mg/dL — ABNORMAL HIGH (ref 65–99)

## 2014-08-27 LAB — CULTURE, RESPIRATORY

## 2014-08-27 MED ORDER — PREDNISONE 50 MG PO TABS
50.0000 mg | ORAL_TABLET | Freq: Every day | ORAL | Status: DC
  Administered 2014-08-28 – 2014-08-29 (×2): 50 mg via ORAL
  Filled 2014-08-27 (×2): qty 1

## 2014-08-27 NOTE — Progress Notes (Addendum)
   08/27/14 0535  Vitals  Temp 97.6 F (36.4 C)  Temp Source Oral  BP (!) 179/58 mmHg  MAP (mmHg) 88  BP Method Automatic  Pulse Rate 71  Pulse Rate Source Monitor  Oxygen Therapy  SpO2 93 %   Spoke with Dr. Netty Starring about pt's BP.  MD ok with current BP stated pt's rounding MD would be by soon.  Gave telephone order to change pt's parameters.  Clarise Cruz, RN

## 2014-08-27 NOTE — Plan of Care (Signed)
Problem: Discharge Progression Outcomes Goal: Other Discharge Outcomes/Goals Outcome: Progressing Plan of Care Progress to Goal: Pt experiencing pain on L side where PNA is.  BUN and creatinine are elevated - dr believes may be due to steroids.  WBC are steadily increasing - today 18.7.  Continues on IV abx.  Also continuing SVN, O2 and prednisone. Pt has rare mold present in sputum - Dr. [name] states we're going to watch it and pt doesn't need to be on isolation.  Pt has bed reserved at [facility]. Probable d/c tomorrow. Oncology consult states no progression of Ca.

## 2014-08-27 NOTE — Progress Notes (Signed)
Physical Therapy Treatment Patient Details Name: Whitney Wright MRN: [id number] DOB: 1940-10-27 Today's Date: 08/27/2014    History of Present Illness Pt is a 74 year old female admitted with chronic respiratory failure, COPD and lung cancer.     PT Comments    Patient continues to experience shortness of breath on exertion, but increases her ambulation distance significantly today. She continues to present with weakness of her LEs and significant fatigue, which limit her mobility and her safety with independent ambulation. At this time patient continues to be a good candidate for short term rehabilitation given her poor balance and decreased exercise tolerance. Skilled acute PT services are indicated to continue to address the above deficits.   Follow Up Recommendations  SNF     Equipment Recommendations       Recommendations for Other Services       Precautions / Restrictions Precautions Precautions: Fall Restrictions Weight Bearing Restrictions: No    Mobility  Bed Mobility Overal bed mobility: Needs Assistance Bed Mobility: Supine to Sit     Supine to sit: Supervision     General bed mobility comments: Patient with minimal cuing to use handrails for transfer.   Transfers Overall transfer level: Needs assistance Equipment used: None   Sit to Stand: Supervision;Min guard         General transfer comment: Patient requires close guarding for sit to stand transfer.   Ambulation/Gait Ambulation/Gait assistance: Min guard Ambulation Distance (Feet): 75 Feet Assistive device:  (Pt used IV pole for hand hold assist.) Gait Pattern/deviations: WFL(Within Functional Limits) Gait velocity: Decreased   General Gait Details: Patient ambulates 80 feet total with multiple rest breaks and HHA via IV pole. Patient with minimal step length and shortness of breath upon prolonged exertion. She displays high falls risk without assistive device secondary to deconditioning.     Stairs            Wheelchair Mobility    Modified Rankin (Stroke Patients Only)       Balance Overall balance assessment: Needs assistance Sitting-balance support: Feet unsupported Sitting balance-Leahy Scale: Good     Standing balance support: Single extremity supported Standing balance-Leahy Scale: Fair Standing balance comment: patient is able to stand independently, but given her minimal step length she is very weak in her LEs and is at high risk for falls.                     Cognition Arousal/Alertness: Awake/alert Behavior During Therapy: WFL for tasks assessed/performed Overall Cognitive Status: Within Functional Limits for tasks assessed                      Exercises General Exercises - Lower Extremity Ankle Circles/Pumps: AROM;Both;10 reps Long Arc Quad: AROM;Both;10 reps Heel Slides: AROM;Both;10 reps Straight Leg Raises: AROM;Both;10 reps Other Exercises Other Exercises: Isometric hip abduction in sitting x 10    General Comments        Pertinent Vitals/Pain Pain Assessment:  (Patient does not mention any pain during the session. )    Home Living                      Prior Function            PT Goals (current goals can now be found in the care plan section) Acute Rehab PT Goals Patient Stated Goal: To return wherever she is safest PT Goal Formulation: With patient Potential to Achieve Goals: Good  Progress towards PT goals: Progressing toward goals    Frequency  Min 2X/week    PT Plan Current plan remains appropriate    Co-evaluation             End of Session Equipment Utilized During Treatment: Gait belt;Oxygen Activity Tolerance: Patient tolerated treatment well Patient left: in bed;with bed alarm set     Time: 5501-5868 PT Time Calculation (min) (ACUTE ONLY): 23 min  Charges:  $Gait Training: 8-22 mins $Therapeutic Exercise: 8-22 mins                    G Codes:      Kerman Passey, PT, DPT    08/27/2014, 4:44 PM

## 2014-08-27 NOTE — Progress Notes (Signed)
PROGRESS NOTE  Daveda Larock XNA:[id number] DOB: 1940-10-16 DOA: 08/21/2014 PCP: Tracie Harrier, MD  Subjective: States she is feeling better. Chest pain not as bad  [place]; 18.7  Consultants: Oncology  Objective: BP 179/58 mmHg  Pulse 71  Temp(Src) 97.6 F (36.4 C) (Oral)  Resp 20  Ht 5' (1.524 m)  Wt 70.308 kg (155 lb)  BMI 30.27 kg/m2  SpO2 93%  Intake/Output Summary (Last 24 hours) at 08/27/14 0824 Last data filed at 08/27/14 0719  Gross per 24 hour  Intake    530 ml  Output   1500 ml  Net   -970 ml   Filed Weights   08/21/14 1528 08/21/14 1810  Weight: 70.478 kg (155 lb 6 oz) 70.308 kg (155 lb)    Exam:        HEENT:  AT   General: In mild distress  Cardiovascular: S1 S2. Left sided chest wall tenderness +  Respiratory: Rhonchi +  Abdomen: Soft Non tender  Neuro:Non Focal  Data Reviewed: Basic Metabolic Panel:  Recent Labs Lab 08/23/14 0611 08/24/14 0456 08/25/14 0517 08/26/14 0526 08/27/14 0601  NA 134* 135 138 138 139  K 4.5 4.4 4.4 4.3 4.4  CL 99* 102 105 108 110  CO2 '26 25 24 22 23  '$ GLUCOSE 256* 174* 109* 103* 86  BUN 77* 85* 94* 84* 83*  CREATININE 2.10* 2.13* 2.08* 1.78* 1.97*  CALCIUM 8.5* 8.6* 8.5* 8.5* 8.5*   Liver Function Tests:  Recent Labs Lab 08/21/14 1802 08/22/14 0726  AST 21 16  ALT 25 24  ALKPHOS 85 71  BILITOT 0.3 0.4  PROT 5.5* 5.2*  ALBUMIN 2.4* 2.2*   No results for input(s): LIPASE, AMYLASE in the last 168 hours. No results for input(s): AMMONIA in the last 168 hours. CBC:  Recent Labs Lab 08/21/14 1802 08/22/14 0726 08/23/14 0611 08/24/14 0456 08/26/14 0526 08/27/14 0601  WBC 13.2* 8.2 10.9 15.2* 17.3* 18.7*  NEUTROABS 11.9*  --  10.2* 13.3*  --  16.5*  HGB 11.4* 10.6* 11.5* 10.5* 11.0* 11.2*  HCT 35.7 33.5* 36.2 33.4* 34.5* 35.2  MCV 84.2 84.3 83.9 83.1 82.7 83.7  PLT 203 170 182 159 148* 141*   Cardiac Enzymes:    Recent Labs Lab 08/21/14 1802 08/21/14 2357 08/22/14 0726   TROPONINI <0.03 <0.03 <0.03   BNP (last 3 results)  Recent Labs  08/17/14 1006  BNP 390.0*    ProBNP (last 3 results) No results for input(s): PROBNP in the last 8760 hours.  CBG:  Recent Labs Lab 08/26/14 0737 08/26/14 1116 08/26/14 1605 08/26/14 2042 08/27/14 0717  GLUCAP 77 244* 326* 236* 71    Recent Results (from the past 240 hour(s))  Culture, sputum-assessment     Status: None   Collection Time: 08/22/14  9:43 AM  Result Value Ref Range Status   Specimen Description EXPECTORATED SPUTUM  Final   Special Requests NONE  Final   Sputum evaluation THIS SPECIMEN IS ACCEPTABLE FOR SPUTUM CULTURE  Final   Report Status 08/22/2014 FINAL  Final  Culture, respiratory (NON-Expectorated)     Status: None (Preliminary result)   Collection Time: 08/22/14  9:43 AM  Result Value Ref Range Status   Specimen Description EXPECTORATED SPUTUM  Final   Special Requests NONE Reflexed from [id number]  Final   Gram Stain   Final    MANY WBC SEEN MODERATE GRAM POSITIVE COCCI MODERATE GRAM NEGATIVE RODS    Culture   Final    LIGHT GROWTH  GRAM NEGATIVE RODS RARE MOLD REFERRED TO [facility] IN [place], [place] FOR IDENTIFICATION/CONFIRMATION IDENTIFICATION AND SUSCEPTIBILITIES TO FOLLOW FOR GRAM NEGATIVE ROD    Report Status PENDING  Incomplete     Studies: Dg Chest 2 View  08/26/2014   CLINICAL DATA:  Pneumonia. History of COPD, lung cancer and prior radiation.  EXAM: CHEST  2 VIEW  COMPARISON:  08/21/2014  FINDINGS: Consolidation in the left lower lobe and lingula compatible with pneumonia. Mass in the medial right upper hemithorax again noted, unchanged. Heart is upper limits normal in size. No visible effusions or acute bony abnormality.  IMPRESSION: Stable left lower lobe airspace opacity concerning for pneumonia.  Stable medial right upper chest paraspinal mass.   Electronically Signed   By: Rolm Baptise M.D.   On: 08/26/2014 10:06    Scheduled  Meds: . aspirin EC  81 mg Oral Daily  . calcium carbonate  500 mg Oral BID WC  . chlorpheniramine-HYDROcodone  5 mL Oral BID  . heparin  5,000 Units Subcutaneous 3 times per day  . hydrALAZINE  50 mg Oral BID  . hydrochlorothiazide  25 mg Oral Daily  . insulin aspart  0-15 Units Subcutaneous TID WC  . insulin aspart  0-5 Units Subcutaneous QHS  . insulin glargine  18 Units Subcutaneous BID  . ipratropium-albuterol  3 mL Nebulization TID  . levothyroxine  100 mcg Oral QAC breakfast  . levothyroxine  88 mcg Oral QAC breakfast  . methylPREDNISolone (SOLU-MEDROL) injection  30 mg Intravenous Q24H  . piperacillin-tazobactam (ZOSYN)  IV  3.375 g Intravenous 3 times per day  . pravastatin  20 mg Oral q1800  . sodium chloride  3 mL Intravenous Q12H  . tamoxifen  20 mg Oral Daily  . verapamil  240 mg Oral Daily   Assessment/Plan:  1 Acute on Chronic Respiratory Failure secondary to COPD, Left sided Pneumonia. Hx of left Lung ca  On Levaquin Pending Sputum cultures - holding for possible pathogen. Continue SVN's, O2  and IV Solumedrol. D/c Solumedrol Change to po prednisone 50 mg po qd  2 HTN: On Losartan hctz- hold hctz given arf 3 Hypothyroidism:On Levothyroxine 4 Acute on Chronic Renal failure: Continue to monitor. Se Creat is 1.97 but BUN way up (may be due to steroids) Cont gentle IVF - holding  hctz 5 Chest pain: Troponin negative. Continue pain management  6 Physical Therapy consult - oob to chair For Rehab Code Status: DNR  Family Communication: Daughter      [facility]   08/27/2014, 8:24 AM  LOS: 6 days

## 2014-08-27 NOTE — Progress Notes (Signed)
PT Cancellation Note  Patient Details Name: Avabella Wailes MRN: [id number] DOB: 23-Mar-1940   Cancelled Treatment:    Reason Eval/Treat Not Completed: Other (comment) (Patient currently having a bath with RN staff. ) PT will re-attempt when patient is available and appropriate.   Kerman Passey, PT, DPT   08/27/2014, 3:38 PM

## 2014-08-27 NOTE — Clinical Social Work Note (Signed)
Clinical Social Work Assessment  Patient Details  Name: Whitney Wright MRN: [id number] Date of Birth: May 14, 1940  Date of referral:  08/27/14               Reason for consult:  Facility Placement                Permission sought to share information with:  Family Supports Permission granted to share information::  Yes, Verbal Permission Granted  Name::      (daughter)   Housing/Transportation Living arrangements for the past 2 months:  Apartment Source of Information:  Patient Patient Interpreter Needed:  None Criminal Activity/Legal Involvement Pertinent to Current Situation/Hospitalization:  No - Comment as needed Significant Relationships:  Adult Children Lives with:  Self Do you feel safe going back to the place where you live?  Yes Need for family participation in patient care:  No (Coment)  Care giving concerns:  No caregiving concerns identified.    Social Worker assessment / plan:  CSW met with pt to address consult. CSW introduced herself and explained role of social work. CSW also explained process of discharging to SNF. PT is recommending SNF placement at discharge. CSW also explained that [employer] would need a prior auth. Pt would like [employer]   CSW initiated SNF search. CSW called [employer], who is able to make a bed offer. [employer] [facility]aufmann will be started. CSW updated pt.   Facility called about labs that were taken that reported mold. CSW paged MD to update him on this. Bed offer may be contingent on this lab. CSW updated RN.  Employment status:  Retired Nurse, adult PT Recommendations:  [place] / Referral to community resources:  [place]  Patient/Family's Response to care:  Pt's daughter and pt are agreeable to SNF for STR at [employer] [facility].  Patient/Family's Understanding of and Emotional Response to Diagnosis, Current Treatment, and Prognosis:   Pt understands that she needs to build up  her strength prior to returning home.  Emotional Assessment Appearance:  Appears stated age Attitude/Demeanor/Rapport:  Other (Pleasant, Appropriate) Affect (typically observed):    Orientation:  Oriented to Self, Oriented to Place, Oriented to  Time, Oriented to Situation Alcohol / Substance use:    Psych involvement (Current and /or in the community):  No (Comment)  Discharge Needs  Concerns to be addressed:  No discharge needs identified Readmission within the last 30 days:  No Current discharge risk:  None Barriers to Discharge:  No Barriers Identified   Darden Dates, LCSW 08/27/2014, 2:41 PM

## 2014-08-27 NOTE — Clinical Social Work Placement (Signed)
   CLINICAL SOCIAL WORK PLACEMENT  NOTE  Date:  08/27/2014  Patient Details  Name: Whitney Wright MRN: [id number] Date of Birth: June 13, 1940  Clinical Social Work is seeking post-discharge placement for this patient at the [place] level of care (*CSW will initial, date and re-position this form in  chart as items are completed):  Yes   Patient/family provided with [place] Work Department's list of facilities offering this level of care within the geographic area requested by the patient (or if unable, by the patient's family).  Yes   Patient/family informed of their freedom to choose among providers that offer the needed level of care, that participate in Medicare, Medicaid or managed care program needed by the patient, have an available bed and are willing to accept the patient.  Yes   Patient/family informed of 's ownership interest in [facility] and [facility], as well as of the fact that they are under no obligation to receive care at these facilities.  PASRR submitted to EDS on 08/27/14     PASRR number received on 08/27/14     Existing PASRR number confirmed on       FL2 transmitted to all facilities in geographic area requested by pt/family on 08/27/14     FL2 transmitted to all facilities within larger geographic area on       Patient informed that his/her managed care company has contracts with or will negotiate with certain facilities, including the following:            Patient/family informed of bed offers received.  Patient chooses bed at       Physician recommends and patient chooses bed at      Patient to be transferred to   on  .  Patient to be transferred to facility by       Patient family notified on   of transfer.  Name of family member notified:        PHYSICIAN       Additional Comment:    _______________________________________________ Darden Dates, LCSW 08/27/2014, 1:44 PM

## 2014-08-27 NOTE — Progress Notes (Signed)
Inpatient Diabetes Program Recommendations  AACE/ADA: New Consensus Statement on Inpatient Glycemic Control (2013)  Target Ranges:  Prepandial:   less than 140 mg/dL      Peak postprandial:   less than 180 mg/dL (1-2 hours)      Critically ill patients:  140 - 180 mg/dL    Results for Whitney Wright, PRAZAK (MRN [id number]) as of 08/27/2014 08:11  Ref. Range 08/26/2014 07:37 08/26/2014 11:16 08/26/2014 16:05 08/26/2014 20:42  Glucose-Capillary Latest Ref Range: 65-99 mg/dL 77 244 (H) 326 (H) 236 (H)     Home DM Meds: Lantus 18 units bid  Regular SSI prn for hyperglycemia  Current DM Orders: Lantus 18 units bid  Novolog Moderate SSI (0-15 units) tid ac + HS    **Note IV Solumedrol decreased to 30 mg daily yesterday.  **Patient eating 70-100% of meals.  **Having postprandial glucose elevations while on IV steroids. Well controlled prior to admission as evidenced by A1c of 7.1%.     MD- Please consider the following in-hospital insulin adjustments while patient receiving IV steroids:  1. Add Novolog Meal Coverage- Novolog 3 units tid with meals [Please use Glycemic Control Order set to order Novolog Meal Coverage]  2. Continue Current Novolog SSI    Will follow Desree Leap Lowella Dell RN, MSN, CDE Diabetes Coordinator Inpatient Glycemic Control Team Team Pager: [phone] (8a-5p)

## 2014-08-27 NOTE — Plan of Care (Signed)
Problem: Discharge Progression Outcomes Goal: Other Discharge Outcomes/Goals Outcome: Progressing Plan of care progress to goal for: 1. Pain-pt c/o pain, prn meds given with improvement 2. Hemodynamically-             -VSS, pt remains afebrile this shift             -IV ABT continues 3. Complications-no evidence of this shift 4. Diet-pt tolerating diet this shift 5. Activity-pt up to [facility] with 1 assist

## 2014-08-28 ENCOUNTER — Inpatient Hospital Stay: Payer: Commercial Managed Care - HMO

## 2014-08-28 DIAGNOSIS — J189 Pneumonia, unspecified organism: Principal | ICD-10-CM

## 2014-08-28 DIAGNOSIS — C3490 Malignant neoplasm of unspecified part of unspecified bronchus or lung: Secondary | ICD-10-CM

## 2014-08-28 LAB — BASIC METABOLIC PANEL
ANION GAP: 10 (ref 5–15)
BUN: 84 mg/dL — ABNORMAL HIGH (ref 6–20)
CALCIUM: 8.5 mg/dL — AB (ref 8.9–10.3)
CO2: 23 mmol/L (ref 22–32)
Chloride: 108 mmol/L (ref 101–111)
Creatinine, Ser: 2.21 mg/dL — ABNORMAL HIGH (ref 0.44–1.00)
GFR, EST AFRICAN AMERICAN: 24 mL/min — AB (ref >60)
GFR, EST NON AFRICAN AMERICAN: 20 mL/min — AB (ref >60)
Glucose, Bld: 50 mg/dL — ABNORMAL LOW (ref 65–99)
Potassium: 4.3 mmol/L (ref 3.5–5.1)
Sodium: 141 mmol/L (ref 135–145)

## 2014-08-28 LAB — CBC WITH DIFFERENTIAL/PLATELET
BASOS ABS: 0.1 10*3/uL (ref 0–0.1)
BASOS PCT: 1 %
EOS ABS: 0.1 10*3/uL (ref 0–0.7)
EOS PCT: 1 %
HEMATOCRIT: 35.2 % (ref 35.0–47.0)
Hemoglobin: 11 g/dL — ABNORMAL LOW (ref 12.0–16.0)
LYMPHS ABS: 1 10*3/uL (ref 1.0–3.6)
Lymphocytes Relative: 5 %
MCH: 26.3 pg (ref 26.0–34.0)
MCHC: 31.3 g/dL — AB (ref 32.0–36.0)
MCV: 84 fL (ref 80.0–100.0)
Monocytes Absolute: 1.3 10*3/uL — ABNORMAL HIGH (ref 0.2–0.9)
Monocytes Relative: 7 %
NEUTROS PCT: 86 %
Neutro Abs: 16.2 10*3/uL — ABNORMAL HIGH (ref 1.4–6.5)
Platelets: 144 10*3/uL — ABNORMAL LOW (ref 150–440)
RBC: 4.2 MIL/uL (ref 3.80–5.20)
RDW: 15.8 % — ABNORMAL HIGH (ref 11.5–14.5)
WBC: 18.6 10*3/uL — AB (ref 3.6–11.0)

## 2014-08-28 LAB — GLUCOSE, CAPILLARY
GLUCOSE-CAPILLARY: 215 mg/dL — AB (ref 65–99)
Glucose-Capillary: 123 mg/dL — ABNORMAL HIGH (ref 65–99)
Glucose-Capillary: 151 mg/dL — ABNORMAL HIGH (ref 65–99)
Glucose-Capillary: 213 mg/dL — ABNORMAL HIGH (ref 65–99)
Glucose-Capillary: 37 mg/dL — CL (ref 65–99)
Glucose-Capillary: 370 mg/dL — ABNORMAL HIGH (ref 65–99)
Glucose-Capillary: 38 mg/dL — CL (ref 65–99)

## 2014-08-28 MED ORDER — LOSARTAN POTASSIUM 50 MG PO TABS
50.0000 mg | ORAL_TABLET | Freq: Every day | ORAL | Status: DC
  Administered 2014-08-28 – 2014-08-29 (×2): 50 mg via ORAL
  Filled 2014-08-28: qty 1

## 2014-08-28 MED ORDER — LEVOFLOXACIN IN D5W 750 MG/150ML IV SOLN
750.0000 mg | INTRAVENOUS | Status: DC
  Filled 2014-08-28: qty 150

## 2014-08-28 MED ORDER — AMOXICILLIN-POT CLAVULANATE 875-125 MG PO TABS
1.0000 | ORAL_TABLET | Freq: Two times a day (BID) | ORAL | Status: DC
Start: 2014-08-28 — End: 2014-08-29
  Administered 2014-08-28 – 2014-08-29 (×2): 1 via ORAL
  Filled 2014-08-28 (×2): qty 1

## 2014-08-28 MED ORDER — LEVOFLOXACIN IN D5W 750 MG/150ML IV SOLN
750.0000 mg | Freq: Once | INTRAVENOUS | Status: AC
  Administered 2014-08-28: 17:00:00 750 mg via INTRAVENOUS
  Filled 2014-08-28: qty 150

## 2014-08-28 MED ORDER — PIPERACILLIN-TAZOBACTAM 3.375 G IVPB
3.3750 g | Freq: Two times a day (BID) | INTRAVENOUS | Status: DC
  Filled 2014-08-28 (×2): qty 50

## 2014-08-28 MED ORDER — LEVOFLOXACIN IN D5W 500 MG/100ML IV SOLN: 500.0000 mg | INTRAVENOUS | Status: DC

## 2014-08-28 NOTE — Plan of Care (Signed)
Problem: Discharge Progression Outcomes Goal: Other Discharge Outcomes/Goals Outcome: Progressing Plan of care progress to goal for: 1. Pain-pt c/o pain x1, prn meds given with improvement 2. Hemodynamically-             -VSS, pt remains afebrile this shift             -IV ABT continues 3. Complications-no evidence of this shift 4. Diet-pt tolerating diet this shift 5. Activity-pt up to [facility] with 1 assist

## 2014-08-28 NOTE — Care Management (Signed)
Important Message  Patient Details  Name: Whitney Wright MRN: [id number] Date of Birth: [birth year]   Medicare Important Message Given:  Yes-third notification given    Juliann Pulse A Allmond 08/28/2014, 1:03 PM

## 2014-08-28 NOTE — Progress Notes (Signed)
Inpatient Diabetes Program Recommendations  AACE/ADA: New Consensus Statement on Inpatient Glycemic Control (2013)  Target Ranges:  Prepandial:   less than 140 mg/dL      Peak postprandial:   less than 180 mg/dL (1-2 hours)      Critically ill patients:  140 - 180 mg/dL   Review of glycemic control:  Results for Whitney Wright, Whitney Wright (MRN [id number]) as of 08/28/2014 11:11  Ref. Range 08/28/2014 07:19 08/28/2014 07:21 08/28/2014 08:45  Glucose-Capillary Latest Ref Range: 65-99 mg/dL 37 (LL) 38 (LL) 123 (H)    Diabetes history: Type 2 diabetes Outpatient Diabetes medications: Lantus 18 units bid Current orders for Inpatient glycemic control:  Lantus 18 units bid, Novolog moderate tid with meals and HS  Consider reducing Lantus to 12 units bid.  Also, while patient is on PO steroids consider adding Novolog 3 units tid with meals to cover meal coverage/post-prandial CBG's.  Thanks, Adah Perl, RN, BC-ADM Inpatient Diabetes Coordinator Pager [phone] (8a-5p)

## 2014-08-28 NOTE — Plan of Care (Signed)
Problem: Discharge Progression Outcomes Goal: Other Discharge Outcomes/Goals Outcome: Progressing Pt growing mold in sputum, awaiting ID to see pt Pt has been cleared by PT and medically to go to rehab facility, but waiting ID doctor to review culture Worked with PT, tolerates short distances then becomes SOB, pt up to chair for meals Held heparin, pt bleeding from abd area, platelets low at 141.

## 2014-08-28 NOTE — Consult Note (Signed)
[facility] Infectious Disease     Reason for Consult: Persistent leukocytosis, sputum cx with stentotrophomonas and mold    Referring Physician: Geralyn Corwin Date of Admission:  08/21/2014   Active Problems:   Acute on chronic respiratory failure   HPI: Whitney Wright is a 74 y.o. female with recurrent non-small cell lung cancer who was admitted 6/22 with acute on chronic respiratory failure secondary to COPD and left lung consolidation.  Sputum culture has yielded stentotrophomans and mold.  She has been treated initially with levofloxacin then changed to zosyn with some clinical improvement but remains with cough and leukocytosis.   Past Medical History  Diagnosis Date  . Hypertension   . Cellulitis and abscess of trunk   . Thyroid disease     hypo  . Arthritis   . Personal history of malignant neoplasm of breast   . Diabetes mellitus without complication   . Senile osteoporosis   . 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 ER 50%, PR 0, HER-2/neu nonamplified.. Tamoxifen chosen as bone density showed severe osteoporosis.  . Lung cancer, upper lobe February 2015    Adenocarcinoma with lepidic pattern, second primary fall 2015  . Breast cancer    Past Surgical History  Procedure Laterality Date  . Abdominal hysterectomy  age 74 . Mammosite balloon placement Left 2012  . Cardiac catheterization  2014  . Breast surgery Left January 11, 2011    left breast stereotactic biopsy: DCIS  . Appendectomy     History  Substance Use Topics  . Smoking status: Former Smoker -- 0.00 packs/day for 30 years  . Smokeless tobacco: Never Used  . Alcohol Use: No   Family History  Problem Relation Age of Onset  . Breast cancer Daughter   . Breast cancer Mother     Allergies:  Allergies  Allergen Reactions  . Macrodantin [Nitrofurantoin] Rash    Current  antibiotics: Antibiotics Given (last 72 hours)    Date/Time Action Medication Dose Rate   08/25/14 1742 Given   Levofloxacin (LEVAQUIN) IVPB 250 mg 250 mg 50 mL/hr   08/26/14 1454 Given   piperacillin-tazobactam (ZOSYN) IVPB 3.375 g 3.375 g 12.5 mL/hr   08/26/14 1955 Given   piperacillin-tazobactam (ZOSYN) IVPB 3.375 g 3.375 g 12.5 mL/hr   08/27/14 0516 Given   piperacillin-tazobactam (ZOSYN) IVPB 3.375 g 3.375 g 12.5 mL/hr   08/27/14 1402 Given   piperacillin-tazobactam (ZOSYN) IVPB 3.375 g 3.375 g 12.5 mL/hr   08/27/14 2051 Given  [pt preference]   piperacillin-tazobactam (ZOSYN) IVPB 3.375 g 3.375 g 12.5 mL/hr   08/28/14 0547 Given   piperacillin-tazobactam (ZOSYN) IVPB 3.375 g 3.375 g 12.5 mL/hr      MEDICATIONS: . aspirin EC  81 mg Oral Daily  . calcium carbonate  500 mg Oral BID WC  . chlorpheniramine-HYDROcodone  5 mL Oral BID  . heparin  5,000 Units Subcutaneous 3 times per day  . hydrALAZINE  50 mg Oral BID  . insulin aspart  0-15 Units Subcutaneous TID WC  . insulin aspart  0-5 Units Subcutaneous QHS  . insulin glargine  18 Units Subcutaneous BID  . ipratropium-albuterol  3 mL Nebulization TID  . levothyroxine  100 mcg Oral QAC breakfast  . levothyroxine  88 mcg Oral QAC breakfast  . losartan  50 mg Oral Daily  . piperacillin-tazobactam (ZOSYN)  IV  3.375 g Intravenous  Q12H  . pravastatin  20 mg Oral q1800  . predniSONE  50 mg Oral Q breakfast  . sodium chloride  3 mL Intravenous Q12H  . tamoxifen  20 mg Oral Daily  . verapamil  240 mg Oral Daily    Review of Systems - 11 systems reviewed and negative per HPI   OBJECTIVE: Temp:  [97.7 F (36.5 C)-98.5 F (36.9 C)] 98.5 F (36.9 C) (06/29 1315) Pulse Rate:  [71-82] 77 (06/29 1315) Resp:  [20] 20 (06/29 0531) BP: (146-189)/(45-65) 146/52 mmHg (06/29 1315) SpO2:  [91 %-97 %] 96 % (06/29 1315) Physical Exam  Constitutional:  oriented to person, place, and time. Chronically ill appearing, on O2 HENT:  [place]/AT, PERRLA, no scleral icterus Mouth/Throat: Oropharynx is clear and moist. No oropharyngeal exudate.  Cardiovascular: Normal rate, regular rhythm and normal heart sounds. Exam reveals no gallop and no friction rub.  No murmur heard.  Pulmonary/Chest: rhonchi bil bases Neck - supple, no nuchal rigidity Abdominal: Soft. Bowel sounds are normal.  exhibits no distension. There is no tenderness.  Lymphadenopathy: no cervical adenopathy. No axillary adenopathy Neurological: alert and oriented to person, place, and time.  Skin: Skin is warm and dry. No rash noted. No erythema.  Psychiatric: a normal mood and affect.  behavior is normal.    LABS: Results for orders placed or performed during the hospital encounter of 08/21/14 (from the past 48 hour(s))  Glucose, capillary     Status: Abnormal   Collection Time: 08/26/14  4:05 PM  Result Value Ref Range   Glucose-Capillary 326 (H) 65 - 99 mg/dL  Glucose, capillary     Status: Abnormal   Collection Time: 08/26/14  8:42 PM  Result Value Ref Range   Glucose-Capillary 236 (H) 65 - 99 mg/dL  CBC with Differential/Platelet     Status: Abnormal   Collection Time: 08/27/14  6:01 AM  Result Value Ref Range   WBC 18.7 (H) 3.6 - 11.0 K/uL   RBC 4.21 3.80 - 5.20 MIL/uL   Hemoglobin 11.2 (L) 12.0 - 16.0 g/dL   HCT 35.2 35.0 - 47.0 %   MCV 83.7 80.0 - 100.0 fL   MCH 26.7 26.0 - 34.0 pg   MCHC 31.9 (L) 32.0 - 36.0 g/dL   RDW 15.5 (H) 11.5 - 14.5 %   Platelets 141 (L) 150 - 440 K/uL   Neutrophils Relative % 88 %   Lymphocytes Relative 5 %   Monocytes Relative 6 %   Eosinophils Relative 0 %   Basophils Relative 1 %   Neutro Abs 16.5 (H) 1.4 - 6.5 K/uL   Lymphs Abs 0.9 (L) 1.0 - 3.6 K/uL   Monocytes Absolute 1.1 (H) 0.2 - 0.9 K/uL   Eosinophils Absolute 0.0 0 - 0.7 K/uL   Basophils Absolute 0.2 (H) 0 - 0.1 K/uL   Smear Review MORPHOLOGY UNREMARKABLE   Basic metabolic panel     Status: Abnormal   Collection Time: 08/27/14  6:01 AM  Result  Value Ref Range   Sodium 139 135 - 145 mmol/L   Potassium 4.4 3.5 - 5.1 mmol/L   Chloride 110 101 - 111 mmol/L   CO2 23 22 - 32 mmol/L   Glucose, Bld 86 65 - 99 mg/dL   BUN 83 (H) 6 - 20 mg/dL   Creatinine, Ser 1.97 (H) 0.44 - 1.00 mg/dL   Calcium 8.5 (L) 8.9 - 10.3 mg/dL   GFR calc non Af Amer 23 (L) >60 mL/min   GFR calc  Af Amer 27 (L) >60 mL/min    Comment: (NOTE) The eGFR has been calculated using the CKD EPI equation. This calculation has not been validated in all clinical situations. eGFR's persistently <60 mL/min signify possible Chronic Kidney Disease.    Anion gap 6 5 - 15  Glucose, capillary     Status: None   Collection Time: 08/27/14  7:17 AM  Result Value Ref Range   Glucose-Capillary 71 65 - 99 mg/dL  Glucose, capillary     Status: Abnormal   Collection Time: 08/27/14 11:16 AM  Result Value Ref Range   Glucose-Capillary 241 (H) 65 - 99 mg/dL  Glucose, capillary     Status: Abnormal   Collection Time: 08/27/14  4:19 PM  Result Value Ref Range   Glucose-Capillary 232 (H) 65 - 99 mg/dL  Glucose, capillary     Status: Abnormal   Collection Time: 08/27/14 10:31 PM  Result Value Ref Range   Glucose-Capillary 258 (H) 65 - 99 mg/dL   Comment 1 Notify RN   Glucose, capillary     Status: Abnormal   Collection Time: 08/28/14 12:21 AM  Result Value Ref Range   Glucose-Capillary 213 (H) 65 - 99 mg/dL   Comment 1 Notify RN   CBC with Differential/Platelet     Status: Abnormal   Collection Time: 08/28/14  6:53 AM  Result Value Ref Range   WBC 18.6 (H) 3.6 - 11.0 K/uL   RBC 4.20 3.80 - 5.20 MIL/uL   Hemoglobin 11.0 (L) 12.0 - 16.0 g/dL   HCT 35.2 35.0 - 47.0 %   MCV 84.0 80.0 - 100.0 fL   MCH 26.3 26.0 - 34.0 pg   MCHC 31.3 (L) 32.0 - 36.0 g/dL   RDW 15.8 (H) 11.5 - 14.5 %   Platelets 144 (L) 150 - 440 K/uL   Neutrophils Relative % 86 %   Neutro Abs 16.2 (H) 1.4 - 6.5 K/uL   Lymphocytes Relative 5 %   Lymphs Abs 1.0 1.0 - 3.6 K/uL   Monocytes Relative 7 %    Monocytes Absolute 1.3 (H) 0.2 - 0.9 K/uL   Eosinophils Relative 1 %   Eosinophils Absolute 0.1 0 - 0.7 K/uL   Basophils Relative 1 %   Basophils Absolute 0.1 0 - 0.1 K/uL  Basic metabolic panel     Status: Abnormal   Collection Time: 08/28/14  6:53 AM  Result Value Ref Range   Sodium 141 135 - 145 mmol/L   Potassium 4.3 3.5 - 5.1 mmol/L   Chloride 108 101 - 111 mmol/L   CO2 23 22 - 32 mmol/L   Glucose, Bld 50 (L) 65 - 99 mg/dL   BUN 84 (H) 6 - 20 mg/dL   Creatinine, Ser 2.21 (H) 0.44 - 1.00 mg/dL   Calcium 8.5 (L) 8.9 - 10.3 mg/dL   GFR calc non Af Amer 20 (L) >60 mL/min   GFR calc Af Amer 24 (L) >60 mL/min    Comment: (NOTE) The eGFR has been calculated using the CKD EPI equation. This calculation has not been validated in all clinical situations. eGFR's persistently <60 mL/min signify possible Chronic Kidney Disease.    Anion gap 10 5 - 15  Glucose, capillary     Status: Abnormal   Collection Time: 08/28/14  7:19 AM  Result Value Ref Range   Glucose-Capillary 37 (LL) 65 - 99 mg/dL   Comment 1 Notify RN   Glucose, capillary     Status: Abnormal   Collection  Time: 08/28/14  7:21 AM  Result Value Ref Range   Glucose-Capillary 38 (LL) 65 - 99 mg/dL   Comment 1 Notify RN   Glucose, capillary     Status: Abnormal   Collection Time: 08/28/14  8:45 AM  Result Value Ref Range   Glucose-Capillary 123 (H) 65 - 99 mg/dL  Glucose, capillary     Status: Abnormal   Collection Time: 08/28/14 11:11 AM  Result Value Ref Range   Glucose-Capillary 151 (H) 65 - 99 mg/dL   No components found for: ESR, C REACTIVE PROTEIN MICRO: Recent Results (from the past 720 hour(s))  Culture, sputum-assessment     Status: None   Collection Time: 08/22/14  9:43 AM  Result Value Ref Range Status   Specimen Description EXPECTORATED SPUTUM  Final   Special Requests NONE  Final   Sputum evaluation THIS SPECIMEN IS ACCEPTABLE FOR SPUTUM CULTURE  Final   Report Status 08/22/2014 FINAL  Final  Culture,  respiratory (NON-Expectorated)     Status: None   Collection Time: 08/22/14  9:43 AM  Result Value Ref Range Status   Specimen Description EXPECTORATED SPUTUM  Final   Special Requests NONE Reflexed from [id number]  Final   Gram Stain   Final    MANY WBC SEEN MODERATE GRAM POSITIVE COCCI MODERATE GRAM NEGATIVE RODS EXCELLENT SPECIMEN - 90-100% WBCS    Culture   Final    LIGHT GROWTH STENOTROPHOMONAS MALTOPHILIA RARE MOLD REFERRED TO [facility] IN [place], [place] IDENTIFICATION/CONFIRMATION    Report Status 08/27/2014 FINAL  Final   Organism ID, Bacteria STENOTROPHOMONAS MALTOPHILIA  Final      Susceptibility   Stenotrophomonas maltophilia - MIC*    LEVOFLOXACIN Value in next row Sensitive      SENSITIVE1    TRIMETH/SULFA Value in next row Sensitive      SENSITIVE<=20    * LIGHT GROWTH STENOTROPHOMONAS MALTOPHILIA    IMAGING: Dg Chest 2 View  08/26/2014   CLINICAL DATA:  Pneumonia. History of COPD, lung cancer and prior radiation.  EXAM: CHEST  2 VIEW  COMPARISON:  08/21/2014  FINDINGS: Consolidation in the left lower lobe and lingula compatible with pneumonia. Mass in the medial right upper hemithorax again noted, unchanged. Heart is upper limits normal in size. No visible effusions or acute bony abnormality.  IMPRESSION: Stable left lower lobe airspace opacity concerning for pneumonia.  Stable medial right upper chest paraspinal mass.   Electronically Signed   By: Rolm Baptise M.D.   On: 08/26/2014 10:06   X-ray Chest Pa And Lateral  08/21/2014   CLINICAL DATA:  Shortness of breath, cough, and chest congestion. Emphysema.  EXAM: CHEST  2 VIEW  COMPARISON:  Chest x-ray and chest CT dated 08/17/2014  FINDINGS: There is slight cardiomegaly with calcification of the thoracic aorta. Persistent patchy infiltrate in the lingula and left lower lobe, unchanged. COPD. Chronic peribronchial thickening on the right. Diffuse osteopenia. Unchanged right apical  paraspinal mass.  IMPRESSION: No change. Persistent patchy infiltrates at the left lung base superimposed on COPD. Unchanged chronic bronchitic changes. Unchanged right paraspinal mass.   Electronically Signed   By: Lorriane Shire M.D.   On: 08/21/2014 19:06   Dg Chest 2 View  08/17/2014   CLINICAL DATA:  Worsening chest pain and shortness of breath, acute onset. Initial encounter.  EXAM: CHEST  2 VIEW  COMPARISON:  Chest radiograph performed 01/30/2014  FINDINGS: The lungs are well-aerated. Mildly worsened left basilar airspace opacification likely  reflects interval progression of the patient's known lung cancer, with underlying postradiation changes. Superimposed pneumonia cannot be excluded. A small left pleural effusion is suspected. Mild right basilar atelectasis is noted. There is no evidence of pneumothorax.  The heart is borderline normal in size. No acute osseous abnormalities are seen. Right paratracheal density reflects the patient's known benign extrapleural mass at the right lung apex.  IMPRESSION: Mildly worsened left basilar airspace opacification likely reflects interval progression of the patient's known lung cancer, with underlying postradiation changes. Superimposed pneumonia cannot be excluded. Suspect small left pleural effusion.   Electronically Signed   By: Garald Balding M.D.   On: 08/17/2014 10:48   Ct Chest Wo Contrast  08/17/2014   CLINICAL DATA:  Shortness of breath and left-sided chest pain. History of breast and lung cancer.  EXAM: CT CHEST WITHOUT CONTRAST  TECHNIQUE: Multidetector CT imaging of the chest was performed following the standard protocol without IV contrast.  COMPARISON:  Chest CT 06/24/2014  FINDINGS: Stable pleural-based soft tissue lesion along the right posterior hemithorax. This measures roughly 3.4 cm in greatest dimension and unchanged. 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 Pretracheal lymph node on sequence 2, image 22 measures 1.2 cm in the short  axis and minimally changed. No significant chest lymphadenopathy. No significant axillary lymphadenopathy. Again noted is a small left pleural effusion. No significant pericardial fluid.  Again noted is a small hiatal hernia. No acute abnormality in the upper abdomen.  Trachea and mainstem bronchi are patent. Diffuse centrilobular emphysema. Stable punctate calcification in the right lower lobe on sequence 2, image 46. Stable interstitial densities along the periphery of the right lower lobe. There is slightly increased densities and consolidation at the left lung base compared to the previous chest CT. The previously described left lower lung nodule is not well demonstrated due to the consolidation in this area. There is stable pleural-based densities in the lingula. Stable punctate nodule in the right upper lobe on sequence 3, image 21.  No acute bone abnormality.  IMPRESSION: Slightly increased consolidation or volume loss in the left lower lobe compared to the previous examination. Previously described nodule in the left lower lobe is not well demonstrated on this examination.  Chronic changes in the lingula.  Centrilobular emphysema.  Stable pleural-based lesion along the posterior right hemithorax likely represents a benign etiology, such as a neurogenic tumor.   Electronically Signed   By: Markus Daft M.D.   On: 08/17/2014 13:08    Assessment:   Whitney Wright is a 74 y.o. female with recurrent non-small cell lung cancer who was admitted 6/22 with acute on chronic respiratory failure secondary to COPD and left lung consolidation.  Sputum culture has yielded stentotrophomans and mold.  She has been treated initially with levofloxacin then changed to zosyn with some clinical improvement but remains with cough and leukocytosis.   Recommendations Repeat CT chest If worsening would suggest  bronch Add levo back to zosyn  Will follow Thank you very much for allowing me to participate in the care of  this patient. Please call with questions.   Cheral Marker. [name], MD

## 2014-08-28 NOTE — Progress Notes (Signed)
Spoke with Dr. Ouida Sills, on call for [facility].IV infiltrated x2 pt does not want to be stuck again. Ice pack applied to area for redness.

## 2014-08-28 NOTE — Progress Notes (Addendum)
ANTIBIOTIC CONSULT NOTE - Follow Up  Pharmacy Consult for Zosyn/Levaquin Indication: aspiration pneumonia  Allergies  Allergen Reactions  . Macrodantin [Nitrofurantoin] Rash    Patient Measurements: Height: 5' (152.4 cm) Weight: 155 lb (70.308 kg) IBW/kg (Calculated) : 45.5   Vital Signs: Temp: 97.7 F (36.5 C) (06/29 0531) Temp Source: Oral (06/29 0531) BP: 170/65 mmHg (06/29 0546) Pulse Rate: 71 (06/29 0532) Intake/Output from previous day: 06/28 0701 - 06/29 0700 In: 723 [P.O.:720; I.V.:3] Out: 1450 [Urine:1450] Intake/Output from this shift:    Labs:  Recent Labs  08/26/14 0526 08/27/14 0601 08/28/14 0653  WBC 17.3* 18.7* 18.6*  HGB 11.0* 11.2* 11.0*  PLT 148* 141* 144*  CREATININE 1.78* 1.97* 2.21*   Estimated Creatinine Clearance: 18.9 mL/min (by C-G formula based on Cr of 2.21).   Microbiology: Recent Results (from the past 720 hour(s))  Culture, sputum-assessment     Status: None   Collection Time: 08/22/14  9:43 AM  Result Value Ref Range Status   Specimen Description EXPECTORATED SPUTUM  Final   Special Requests NONE  Final   Sputum evaluation THIS SPECIMEN IS ACCEPTABLE FOR SPUTUM CULTURE  Final   Report Status 08/22/2014 FINAL  Final  Culture, respiratory (NON-Expectorated)     Status: None   Collection Time: 08/22/14  9:43 AM  Result Value Ref Range Status   Specimen Description EXPECTORATED SPUTUM  Final   Special Requests NONE Reflexed from [id number]  Final   Gram Stain   Final    MANY WBC SEEN MODERATE GRAM POSITIVE COCCI MODERATE GRAM NEGATIVE RODS EXCELLENT SPECIMEN - 90-100% WBCS    Culture   Final    LIGHT GROWTH STENOTROPHOMONAS MALTOPHILIA RARE MOLD REFERRED TO [facility] IN [place], [place] IDENTIFICATION/CONFIRMATION    Report Status 08/27/2014 FINAL  Final   Organism ID, Bacteria STENOTROPHOMONAS MALTOPHILIA  Final      Susceptibility   Stenotrophomonas maltophilia - MIC*    LEVOFLOXACIN  Value in next row Sensitive      SENSITIVE1    TRIMETH/SULFA Value in next row Sensitive      SENSITIVE<=20    * LIGHT GROWTH STENOTROPHOMONAS MALTOPHILIA    Medical History: Past Medical History  Diagnosis Date  . Hypertension   . Cellulitis and abscess of trunk   . Thyroid disease     hypo  . Arthritis   . Personal history of malignant neoplasm of breast   . Diabetes mellitus without complication   . Senile osteoporosis   . 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 ER 50%, PR 0, HER-2/neu nonamplified.. Tamoxifen chosen as bone density showed severe osteoporosis.  . Lung cancer, upper lobe February 2015    Adenocarcinoma with lepidic pattern, second primary fall 2015  . Breast cancer     Assessment: 74yo female with hx of left lung cancer here with acute on chronic respiratory failure due to COPD and left sided PNA changing from levofloxacin to Zosyn for aspiration PNA.  BCx with light growth stenotrophomonas maltophilia. Per ID, start Levaquin in addition to Zosyn therapy.  SCr: 2.21, est CrCl~18.9 mL/min  Goal of Therapy:  Resolution of infection  Plan:  Will transition patient to Zosyn 3.375 gm IV q12h per EI protocol as CrCl<20 mL/min.  Will order Levaquin 750 mg IV once then start Levaquin 500 mg IV q48h based on renal function.  Pharmacy will  continue to follow.  Murrell Converse, PharmD Clinical Pharmacist 08/28/2014

## 2014-08-28 NOTE — Progress Notes (Signed)
PROGRESS NOTE  Whitney Wright TDD:[id number] DOB: 1940-06-13 DOA: 08/21/2014 PCP: Tracie Harrier, MD  Subjective: Pt continues to be short of breath with intermittent cough Chest pain not as bad  [place]; 18.6, Se Creat; 2.21  Consultants: Oncology  Objective: BP 170/65 mmHg  Pulse 71  Temp(Src) 97.7 F (36.5 C) (Oral)  Resp 20  Ht 5' (1.524 m)  Wt 70.308 kg (155 lb)  BMI 30.27 kg/m2  SpO2 97%  Intake/Output Summary (Last 24 hours) at 08/28/14 0826 Last data filed at 08/28/14 0700  Gross per 24 hour  Intake    483 ml  Output   1250 ml  Net   -767 ml   Filed Weights   08/21/14 1528 08/21/14 1810  Weight: 70.478 kg (155 lb 6 oz) 70.308 kg (155 lb)    Exam:        HEENT: [place] AT   General: In mild distress  Cardiovascular: S1 S2. Left sided chest wall tenderness +  Respiratory: Rhonchi +  Abdomen: Soft Non tender  Neuro:Non Focal  Data Reviewed: Basic Metabolic Panel:  Recent Labs Lab 08/24/14 0456 08/25/14 0517 08/26/14 0526 08/27/14 0601 08/28/14 0653  NA 135 138 138 139 141  K 4.4 4.4 4.3 4.4 4.3  CL 102 105 108 110 108  CO2 '25 24 22 23 23  '$ GLUCOSE 174* 109* 103* 86 50*  BUN 85* 94* 84* 83* 84*  CREATININE 2.13* 2.08* 1.78* 1.97* 2.21*  CALCIUM 8.6* 8.5* 8.5* 8.5* 8.5*   Liver Function Tests:  Recent Labs Lab 08/21/14 1802 08/22/14 0726  AST 21 16  ALT 25 24  ALKPHOS 85 71  BILITOT 0.3 0.4  PROT 5.5* 5.2*  ALBUMIN 2.4* 2.2*   No results for input(s): LIPASE, AMYLASE in the last 168 hours. No results for input(s): AMMONIA in the last 168 hours. CBC:  Recent Labs Lab 08/21/14 1802  08/23/14 6237 08/24/14 0456 08/26/14 0526 08/27/14 0601 08/28/14 0653  WBC 13.2*  < > 10.9 15.2* 17.3* 18.7* 18.6*  NEUTROABS 11.9*  --  10.2* 13.3*  --  16.5* 16.2*  HGB 11.4*  < > 11.5* 10.5* 11.0* 11.2* 11.0*  HCT 35.7  < > 36.2 33.4* 34.5* 35.2 35.2  MCV 84.2  < > 83.9 83.1 82.7 83.7 84.0  PLT 203  < > 182 159 148* 141* 144*  < > =  values in this interval not displayed. Cardiac Enzymes:    Recent Labs Lab 08/21/14 1802 08/21/14 2357 08/22/14 0726  TROPONINI <0.03 <0.03 <0.03   BNP (last 3 results)  Recent Labs  08/17/14 1006  BNP 390.0*    ProBNP (last 3 results) No results for input(s): PROBNP in the last 8760 hours.  CBG:  Recent Labs Lab 08/27/14 1619 08/27/14 2231 08/28/14 0021 08/28/14 0719 08/28/14 0721  GLUCAP 232* 258* 213* 37* 38*    Recent Results (from the past 240 hour(s))  Culture, sputum-assessment     Status: None   Collection Time: 08/22/14  9:43 AM  Result Value Ref Range Status   Specimen Description EXPECTORATED SPUTUM  Final   Special Requests NONE  Final   Sputum evaluation THIS SPECIMEN IS ACCEPTABLE FOR SPUTUM CULTURE  Final   Report Status 08/22/2014 FINAL  Final  Culture, respiratory (NON-Expectorated)     Status: None   Collection Time: 08/22/14  9:43 AM  Result Value Ref Range Status   Specimen Description EXPECTORATED SPUTUM  Final   Special Requests NONE Reflexed from [id number]  Final  Gram Stain   Final    MANY WBC SEEN MODERATE GRAM POSITIVE COCCI MODERATE GRAM NEGATIVE RODS EXCELLENT SPECIMEN - 90-100% WBCS    Culture   Final    LIGHT GROWTH STENOTROPHOMONAS MALTOPHILIA RARE MOLD REFERRED TO [facility] IN [place], [place] IDENTIFICATION/CONFIRMATION    Report Status 08/27/2014 FINAL  Final   Organism ID, Bacteria STENOTROPHOMONAS MALTOPHILIA  Final      Susceptibility   Stenotrophomonas maltophilia - MIC*    LEVOFLOXACIN Value in next row Sensitive      SENSITIVE1    TRIMETH/SULFA Value in next row Sensitive      SENSITIVE<=20    * LIGHT GROWTH STENOTROPHOMONAS MALTOPHILIA     Studies: No results found.  Scheduled Meds: . aspirin EC  81 mg Oral Daily  . calcium carbonate  500 mg Oral BID WC  . chlorpheniramine-HYDROcodone  5 mL Oral BID  . heparin  5,000 Units Subcutaneous 3 times per day  . hydrALAZINE  50  mg Oral BID  . hydrochlorothiazide  25 mg Oral Daily  . insulin aspart  0-15 Units Subcutaneous TID WC  . insulin aspart  0-5 Units Subcutaneous QHS  . insulin glargine  18 Units Subcutaneous BID  . ipratropium-albuterol  3 mL Nebulization TID  . levothyroxine  100 mcg Oral QAC breakfast  . levothyroxine  88 mcg Oral QAC breakfast  . piperacillin-tazobactam (ZOSYN)  IV  3.375 g Intravenous Q12H  . pravastatin  20 mg Oral q1800  . predniSONE  50 mg Oral Q breakfast  . sodium chloride  3 mL Intravenous Q12H  . tamoxifen  20 mg Oral Daily  . verapamil  240 mg Oral Daily   Assessment/Plan:  1 Acute on Chronic Respiratory Failure secondary to COPD, Left sided Pneumonia. Hx of left Lung ca Sputum; Stenotrophomonas Maltophila and rare mold - awaiting ID on mold On Levaquin and Zosyn. Will consult ID Continue SVN's and Po prednisone  2 HTN: BP is high- Start Losartan 50 mg po qd, D/c HCTZ 3 Hypothyroidism:On Levothyroxine 4 Acute on Chronic Renal failure: Continue to monitor. Se Creat is 2.21 but BUN way up (may be due to steroids) Cont gentle IVF - holding  hctz 5 Chest pain: Troponin negative. Continue pain management  6 Physical Therapy consult - oob to chair For Rehab Code Status: DNR  Family Communication: Daughter      [facility]   08/28/2014, 8:26 AM  LOS: 7 days

## 2014-08-28 NOTE — Progress Notes (Signed)
Unable to establish IV access. Pt still on IV antibiotics. Pt for possible discharge tomorrow. Notified Dr Ouida Sills via telephone. Order given for po Augmentin 875 mg q12 hours and to discontinue IV antibiotics. Will continue to monitor.Jeffie Pollock, RN

## 2014-08-28 NOTE — Progress Notes (Signed)
Physical Therapy Treatment Patient Details Name: Whitney Wright MRN: [id number] DOB: 20-Jun-1940 Today's Date: 08/28/2014    History of Present Illness Pt is a 74 year old female admitted with chronic respiratory failure, COPD and lung cancer.     PT Comments    Pt is making good progress towards goals with increased ambulation distance this session. Pt still requires O2 for all mobility and fatigues quickly, not safe for home discharge. Pt requires cues for pursed lip breathing. Pt motivated to perform therapy with goals of improved ambulation with decreased SOB symptoms.   Follow Up Recommendations  SNF     Equipment Recommendations       Recommendations for Other Services       Precautions / Restrictions Precautions Precautions: Fall Restrictions Weight Bearing Restrictions: No    Mobility  Bed Mobility Overal bed mobility: Needs Assistance Bed Mobility: Supine to Sit     Supine to sit: Supervision     General bed mobility comments: Patient with minimal cuing to use handrails for transfer.   Transfers Overall transfer level: Needs assistance Equipment used: Rolling walker (2 wheeled) Transfers: Sit to/from Stand Sit to Stand: Min guard         General transfer comment: Patient requires close guarding for sit to stand transfer.   Ambulation/Gait Ambulation/Gait assistance: Min assist Ambulation Distance (Feet): 100 Feet Assistive device: Rolling walker (2 wheeled)       General Gait Details: Pt ambulated using rw and O2 for all mobility. Pt rates SOB at 6/10 at rest and increases with exertion. Pt fatigues quickly and ambulates with slow gait speed. Pt requires cues for pursed lip breathing.   Stairs            Wheelchair Mobility    Modified Rankin (Stroke Patients Only)       Balance                                    Cognition Arousal/Alertness: Awake/alert Behavior During Therapy: WFL for tasks  assessed/performed Overall Cognitive Status: Within Functional Limits for tasks assessed                      Exercises Other Exercises Other Exercises: exercises deferred as lunch tray being brought into room    General Comments        Pertinent Vitals/Pain Pain Assessment: No/denies pain    Home Living                      Prior Function            PT Goals (current goals can now be found in the care plan section) Acute Rehab PT Goals Patient Stated Goal: To return wherever she is safest PT Goal Formulation: With patient Potential to Achieve Goals: Good Progress towards PT goals: Progressing toward goals    Frequency  Min 2X/week    PT Plan Current plan remains appropriate    Co-evaluation             End of Session Equipment Utilized During Treatment: Gait belt;Oxygen Activity Tolerance: Patient tolerated treatment well Patient left: in chair;with chair alarm set     Time: 0962-8366 PT Time Calculation (min) (ACUTE ONLY): 15 min  Charges:  $Gait Training: 8-22 mins  G Codes:      Whitney Wright 08/28/2014, 2:30 PM This note has been reviewed and this clinician agrees with information provided.  Greggory Stallion, PT, DPT [phone]

## 2014-08-29 ENCOUNTER — Encounter
Admission: RE | Admit: 2014-08-29 | Discharge: 2014-08-29 | Disposition: A | Discharge status: Home / Self Care | Payer: Commercial Managed Care - HMO | Source: Ambulatory Visit | Attending: Internal Medicine | Admitting: Internal Medicine

## 2014-08-29 LAB — CBC WITH DIFFERENTIAL/PLATELET
Basophils Absolute: 0 10*3/uL (ref 0–0.1)
Basophils Relative: 0 %
Eosinophils Absolute: 0 10*3/uL (ref 0–0.7)
Eosinophils Relative: 0 %
HCT: 35.8 % (ref 35.0–47.0)
Hemoglobin: 11.6 g/dL — ABNORMAL LOW (ref 12.0–16.0)
LYMPHS ABS: 0.9 10*3/uL — AB (ref 1.0–3.6)
Lymphocytes Relative: 5 %
MCH: 27.3 pg (ref 26.0–34.0)
MCHC: 32.5 g/dL (ref 32.0–36.0)
MCV: 83.8 fL (ref 80.0–100.0)
MONOS PCT: 6 %
Monocytes Absolute: 1.1 10*3/uL — ABNORMAL HIGH (ref 0.2–0.9)
NEUTROS PCT: 89 %
Neutro Abs: 16.9 10*3/uL — ABNORMAL HIGH (ref 1.4–6.5)
Platelets: 112 10*3/uL — ABNORMAL LOW (ref 150–440)
RBC: 4.27 MIL/uL (ref 3.80–5.20)
RDW: 15.7 % — AB (ref 11.5–14.5)
WBC: 18.9 10*3/uL — ABNORMAL HIGH (ref 3.6–11.0)

## 2014-08-29 LAB — BASIC METABOLIC PANEL
Anion gap: 9 (ref 5–15)
BUN: 84 mg/dL — ABNORMAL HIGH (ref 6–20)
CO2: 21 mmol/L — ABNORMAL LOW (ref 22–32)
Calcium: 8.5 mg/dL — ABNORMAL LOW (ref 8.9–10.3)
Chloride: 112 mmol/L — ABNORMAL HIGH (ref 101–111)
Creatinine, Ser: 2.17 mg/dL — ABNORMAL HIGH (ref 0.44–1.00)
GFR calc Af Amer: 24 mL/min — ABNORMAL LOW (ref >60)
GFR calc non Af Amer: 21 mL/min — ABNORMAL LOW (ref >60)
Glucose, Bld: 80 mg/dL (ref 65–99)
Potassium: 5.1 mmol/L (ref 3.5–5.1)
Sodium: 142 mmol/L (ref 135–145)

## 2014-08-29 LAB — GLUCOSE, CAPILLARY
GLUCOSE-CAPILLARY: 77 mg/dL (ref 65–99)
Glucose-Capillary: 276 mg/dL — ABNORMAL HIGH (ref 65–99)
Glucose-Capillary: 285 mg/dL — ABNORMAL HIGH (ref 65–99)

## 2014-08-29 MED ORDER — PREDNISONE 20 MG PO TABS: 40.0000 mg | ORAL_TABLET | Freq: Every day | ORAL | Status: DC

## 2014-08-29 MED ORDER — LEVOFLOXACIN 250 MG PO TABS: 250.0000 mg | ORAL_TABLET | Freq: Every day | ORAL | Status: DC

## 2014-08-29 MED ORDER — PREDNISONE 10 MG PO TABS: ORAL_TABLET | ORAL | Status: DC

## 2014-08-29 MED ORDER — OXYCODONE-ACETAMINOPHEN 5-325 MG PO TABS: 1.0000 | ORAL_TABLET | Freq: Four times a day (QID) | ORAL | Status: AC | PRN

## 2014-08-29 MED ORDER — HYDRALAZINE HCL 50 MG PO TABS: 50.0000 mg | ORAL_TABLET | Freq: Two times a day (BID) | ORAL | Status: AC

## 2014-08-29 NOTE — Clinical Social Work Placement (Signed)
   CLINICAL SOCIAL WORK PLACEMENT  NOTE  Date:  08/29/2014  Patient Details  Name: Whitney Wright MRN: [id number] Date of Birth: 1940/03/28  Clinical Social Work is seeking post-discharge placement for this patient at the [place] level of care (*CSW will initial, date and re-position this form in  chart as items are completed):  Yes   Patient/family provided with [place] Work Department's list of facilities offering this level of care within the geographic area requested by the patient (or if unable, by the patient's family).  Yes   Patient/family informed of their freedom to choose among providers that offer the needed level of care, that participate in Medicare, Medicaid or managed care program needed by the patient, have an available bed and are willing to accept the patient.  Yes   Patient/family informed of [facility]'s ownership interest in [facility] and [facility], as well as of the fact that they are under no obligation to receive care at these facilities.  PASRR submitted to EDS on 08/27/14     PASRR number received on 08/27/14     Existing PASRR number confirmed on       FL2 transmitted to all facilities in geographic area requested by pt/family on 08/27/14     FL2 transmitted to all facilities within larger geographic area on       Patient informed that his/her managed care company has contracts with or will negotiate with certain facilities, including the following:        Yes   Patient/family informed of bed offers received.  Patient chooses bed at  [facility])     Physician recommends and patient chooses bed at  [facility])    Patient to be transferred to  [facility]) on 08/29/14.  Patient to be transferred to facility by  [facility])     Patient family notified on 08/29/14 of transfer.  Name of family member notified:   (daughter)     PHYSICIAN       Additional Comment: Craig Staggers #  [id number]   _______________________________________________ Darden Dates, LCSW 08/29/2014, 2:26 PM

## 2014-08-29 NOTE — Progress Notes (Signed)
Inpatient Diabetes Program Recommendations  AACE/ADA: New Consensus Statement on Inpatient Glycemic Control (2013)  Target Ranges:  Prepandial:   less than 140 mg/dL      Peak postprandial:   less than 180 mg/dL (1-2 hours)      Critically ill patients:  140 - 180 mg/dL   Results for Whitney Wright, Whitney Wright (MRN [id number]) as of 08/29/2014 11:59  Ref. Range 08/28/2014 07:19 08/28/2014 07:21 08/28/2014 08:45 08/28/2014 11:11 08/28/2014 16:22 08/28/2014 21:48 08/29/2014 07:22 08/29/2014 11:23  Glucose-Capillary Latest Ref Range: 65-99 mg/dL 37 (LL) 38 (LL) 123 (H) 151 (H) 215 (H) 370 (H) 77 276 (H)   Results for Whitney Wright, Whitney Wright (MRN [id number]) as of 08/29/2014 11:59  Ref. Range 08/27/2014 07:17 08/27/2014 11:16 08/27/2014 16:19 08/27/2014 22:31 08/28/2014 00:21  Glucose-Capillary Latest Ref Range: 65-99 mg/dL 71 241 (H) 232 (H) 258 (H) 213 (H)   Diabetes history: DM2 Outpatient Diabetes medications: Lantus 18 units BID, Novolin R as needed for high glucose Current orders for Inpatient glycemic control: Lantus 18 units BID, Novolog 0-15 units TID with meals, Novolog 0-5 units HS  Inpatient Diabetes Program Recommendations Insulin - Basal: In reveiwing chart, noted glucose 38 mg/dl yesterday morning and morning dose of Levemir was not given yesterday morning. Patient only received Lantus 18 units last night at bedtime and fasting this morning was 77 mg/dl. As a result of not receiving any basal insulin yesterday morning glucose increased to 370 mg/dl by bedtime last night. Anticipate basal insulin needs to be decreased. Recommend decreasing Lantus to 14 units BID. Insulin - Meal Coverage: Post prandial glucose is consistently elevated. If steroids are continued, please consider ordering Novolog 4 units TID with meals for meal coverage.  Note: Patient is ordered Prednisone 40 mg QAM which is contributing to hyperglycemia as noted in post prandial glucose levels.  Thanks, Barnie Alderman, RN, MSN, CCRN,  CDE Diabetes Coordinator Inpatient Diabetes Program [phone] (Team Pager from [place] to [place]) [phone] ([place] office) [phone] [facility] [address] office) [phone] [facility] office)

## 2014-08-29 NOTE — Consult Note (Signed)
ONCOLOGY followup note -  HPI: states she is still doing same, has intermittent left chest discomfort but pain medication is helping. Denies fevers or chills. ROS: No headaches. No bleeding issues.  Exam: weak, resting in bed, on [place] O2, otherwise alert and oriented, NAD.             Vitals - afebrile, stable, 94% on [place] O2.             Lungs - b/l diminished BS             Abd - soft, nontender Labs: WBC 18.9, Hb 11.6, plts 112, Cr 2.17.  Repeat CT Chest 08/28/14 - IMPRESSION:  1. Chronic nodular and reticular nodular airspace opacities and interstitial opacities in the left lower lobe and lingula suggests chronic infection. No significant change from CT of 06/24/2014. 2. Mild branching nodular airspace disease in the right lower lobe is new. This could represent early or resolving pneumonia. 3. 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  Impression/Recommendations: 74 y.o. female with a history of adenocarcinoma of the lung diagnosed 03/2013, completed stereotactic radiation on 04/2013.PET scan on 01/15/2014 revealed progressive enlargement of the left lower lobe nodule. 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 She received stereotactic radiation to a second lung location (completed 03/07/2014). She did not receive chemotherapy secondary to borderline performance status and her reluctance to take chemotherapy.Chest CT scan on 06/24/2014 was stable. Patient has persistent pain in left lower chest area and had repeat CT Chest done today which again shows Chronic nodular and reticular nodular airspace opacities and interstitial opacities in the left lower lobe and lingula suggests chronic infection. No significant change from CT of 06/24/2014. Recommend completing course of antibiotic for possible pneumonia following which she will need repeat CT Chest in few months to see if there is evidence of  recurrent/progressive lung cancer. In the meantime, if her conditiion declines from COPD or other medical issues recommend considering Palliative Care to discuss hospice. Will continue to follow as indicated.

## 2014-08-29 NOTE — Progress Notes (Signed)
PROGRESS NOTE  Whitney Wright OJJ:[id number] DOB: Jun 10, 1940 DOA: 08/21/2014 PCP: Tracie Harrier, MD  Subjective: Pt continues to be short of breath with intermittent cough Chest pain not as bad  [place]; 18.9, Se Creat; 2.17  Consultants: Oncology  ID  Objective: BP 152/53 mmHg  Pulse 78  Temp(Src) 97.9 F (36.6 C) (Oral)  Resp 18  Ht 5' (1.524 m)  Wt 70.308 kg (155 lb)  BMI 30.27 kg/m2  SpO2 94%  Intake/Output Summary (Last 24 hours) at 08/29/14 0823 Last data filed at 08/29/14 0320  Gross per 24 hour  Intake    480 ml  Output   1400 ml  Net   -920 ml   Filed Weights   08/21/14 1528 08/21/14 1810  Weight: 70.478 kg (155 lb 6 oz) 70.308 kg (155 lb)    Exam:        HEENT: [place] AT   General: In mild distress  Cardiovascular: S1 S2. Left sided chest wall tenderness +  Respiratory: Rhonchi + Crackles +left base posteriorly  Abdomen: Soft Non tender  Neuro:Non Focal  Data Reviewed: Basic Metabolic Panel:  Recent Labs Lab 08/25/14 0517 08/26/14 0526 08/27/14 0601 08/28/14 0653 08/29/14 0637  NA 138 138 139 141 142  K 4.4 4.3 4.4 4.3 5.1  CL 105 108 110 108 112*  CO2 '24 22 23 23 '$ 21*  GLUCOSE 109* 103* 86 50* 80  BUN 94* 84* 83* 84* 84*  CREATININE 2.08* 1.78* 1.97* 2.21* 2.17*  CALCIUM 8.5* 8.5* 8.5* 8.5* 8.5*   Liver Function Tests: No results for input(s): AST, ALT, ALKPHOS, BILITOT, PROT, ALBUMIN in the last 168 hours. No results for input(s): LIPASE, AMYLASE in the last 168 hours. No results for input(s): AMMONIA in the last 168 hours. CBC:  Recent Labs Lab 08/23/14 0611 08/24/14 0456 08/26/14 0526 08/27/14 0601 08/28/14 0653 08/29/14 0637  WBC 10.9 15.2* 17.3* 18.7* 18.6* 18.9*  NEUTROABS 10.2* 13.3*  --  16.5* 16.2* 16.9*  HGB 11.5* 10.5* 11.0* 11.2* 11.0* 11.6*  HCT 36.2 33.4* 34.5* 35.2 35.2 35.8  MCV 83.9 83.1 82.7 83.7 84.0 83.8  PLT 182 159 148* 141* 144* 112*   Cardiac Enzymes:   No results for input(s): CKTOTAL,  CKMB, CKMBINDEX, TROPONINI in the last 168 hours. BNP (last 3 results)  Recent Labs  08/17/14 1006  BNP 390.0*    ProBNP (last 3 results) No results for input(s): PROBNP in the last 8760 hours.  CBG:  Recent Labs Lab 08/28/14 0845 08/28/14 1111 08/28/14 1622 08/28/14 2148 08/29/14 0722  GLUCAP 123* 151* 215* 370* 77    Recent Results (from the past 240 hour(s))  Culture, sputum-assessment     Status: None   Collection Time: 08/22/14  9:43 AM  Result Value Ref Range Status   Specimen Description EXPECTORATED SPUTUM  Final   Special Requests NONE  Final   Sputum evaluation THIS SPECIMEN IS ACCEPTABLE FOR SPUTUM CULTURE  Final   Report Status 08/22/2014 FINAL  Final  Culture, respiratory (NON-Expectorated)     Status: None   Collection Time: 08/22/14  9:43 AM  Result Value Ref Range Status   Specimen Description EXPECTORATED SPUTUM  Final   Special Requests NONE Reflexed from [id number]  Final   Gram Stain   Final    MANY WBC SEEN MODERATE GRAM POSITIVE COCCI MODERATE GRAM NEGATIVE RODS EXCELLENT SPECIMEN - 90-100% WBCS    Culture   Final    LIGHT GROWTH STENOTROPHOMONAS MALTOPHILIA RARE MOLD REFERRED TO [facility] [place] IDENTIFICATION/CONFIRMATION    Report Status 08/27/2014 FINAL  Final   Organism ID, Bacteria STENOTROPHOMONAS MALTOPHILIA  Final      Susceptibility   Stenotrophomonas maltophilia - MIC*    LEVOFLOXACIN Value in next row Sensitive      SENSITIVE1    TRIMETH/SULFA Value in next row Sensitive      SENSITIVE<=20    * LIGHT GROWTH STENOTROPHOMONAS MALTOPHILIA     Studies: Ct Chest Wo Contrast  08/28/2014   CLINICAL DATA:  Limited hospital 1 week prior for pneumonia. History of lung cancer and breast cancer.  EXAM: CT CHEST WITHOUT CONTRAST  TECHNIQUE: Multidetector CT imaging of the chest was performed following the standard protocol without IV contrast.  COMPARISON:  Radiographs 08/26/2014, CT 06/24/2014,  PET-CT 01/15/2014  FINDINGS: Mediastinum/Nodes: No axillary or supraclavicular lymphadenopathy. No mediastinal hilar adenopathy. No pericardial fluid esophagus is normal  Lungs/Pleura: Extensive centrilobular emphysema in the upper lobes. There is peripheral reticulation and peribronchial thickening in the lingula. There is bandlike consolidation within the lateral left lower lobe with some of more focal consolidation posterior left lower lobe. (Image 46, series 3 and image 33, series 3).  There is a fine branching nodular pattern posterior right lung base on image 39, series 3. These lower lobe findings are similar to CT exam of 06/24/2014.  There is a rounded pleural-based mass in the superior medial right hemi thorax measuring 31 x 19 mm. This lesion has simple fluid attenuation and was not significantly metabolic on comparison PET-CT scan. The lesion is unchanged in size from the PET-CT exam.  Upper abdomen: Limited view of the liver, kidneys, pancreas are unremarkable. Normal adrenal glands.  Musculoskeletal: No aggressive osseous lesion.  IMPRESSION: 1. Chronic nodular and reticular nodular airspace opacities and interstitial opacities in the left lower lobe and lingula suggests chronic infection. No significant change from CT of 06/24/2014. 2. Mild branching nodular airspace disease in the right lower lobe is new. This could represent early or resolving pneumonia. 3. 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   Electronically Signed   By: Suzy Bouchard M.D.   On: 08/28/2014 16:49    Scheduled Meds: . amoxicillin-clavulanate  1 tablet Oral Q12H  . aspirin EC  81 mg Oral Daily  . calcium carbonate  500 mg Oral BID WC  . chlorpheniramine-HYDROcodone  5 mL Oral BID  . heparin  5,000 Units Subcutaneous 3 times per day  . hydrALAZINE  50 mg Oral BID  . insulin aspart  0-15 Units Subcutaneous TID WC  . insulin aspart  0-5 Units  Subcutaneous QHS  . insulin glargine  18 Units Subcutaneous BID  . ipratropium-albuterol  3 mL Nebulization TID  . levothyroxine  100 mcg Oral QAC breakfast  . levothyroxine  88 mcg Oral QAC breakfast  . losartan  50 mg Oral Daily  . pravastatin  20 mg Oral q1800  . predniSONE  50 mg Oral Q breakfast  . sodium chloride  3 mL Intravenous Q12H  . tamoxifen  20 mg Oral Daily  . verapamil  240 mg Oral Daily   Assessment/Plan:  1 Acute on Chronic Respiratory Failure secondary to COPD, Left sided Pneumonia. Hx of left Lung ca Sputum; Stenotrophomonas Maltophila and rare mold - awaiting ID on mold On Levaquin and Augmentin CT Scan: Nodular and reticular nodular air space opacities in left Lower lobe and Lingula  Appreciate input from ID  Continue SVN's and Po prednisone  2 HTN: On Losartan 50 mg po qd, D/c HCTZ 3 Hypothyroidism:On Levothyroxine 4 Acute on Chronic Renal failure: Continue to monitor. Se Creat is 2.21 but BUN is  up (may be due to steroids) Cont gentle IVF - holding  hctz 5 Chest pain: Troponin negative. Continue pain management  6 Physical Therapy consult - oob to chair For Rehab Code Status: DNR  Family Communication: Daughter      [facility]   08/29/2014, 8:23 AM  LOS: 8 days

## 2014-08-29 NOTE — Progress Notes (Signed)
[place] INFECTIOUS DISEASE PROGRESS NOTE Date of Admission:  08/21/2014     ID: Whitney Wright is a 74 y.o. female with PNA with stentotrophomonas and mold  Active Problems:   Acute on chronic respiratory failure   Subjective: Still sob, no fevers. Cough moderate  ROS  Eleven systems are reviewed and negative except per hpi  Medications:  Antibiotics Given (last 72 hours)    Date/Time Action Medication Dose Rate   08/26/14 1454 Given   piperacillin-tazobactam (ZOSYN) IVPB 3.375 g 3.375 g 12.5 mL/hr   08/26/14 1955 Given   piperacillin-tazobactam (ZOSYN) IVPB 3.375 g 3.375 g 12.5 mL/hr   08/27/14 0516 Given   piperacillin-tazobactam (ZOSYN) IVPB 3.375 g 3.375 g 12.5 mL/hr   08/27/14 1402 Given   piperacillin-tazobactam (ZOSYN) IVPB 3.375 g 3.375 g 12.5 mL/hr   08/27/14 2051 Given  [pt preference]   piperacillin-tazobactam (ZOSYN) IVPB 3.375 g 3.375 g 12.5 mL/hr   08/28/14 0547 Given   piperacillin-tazobactam (ZOSYN) IVPB 3.375 g 3.375 g 12.5 mL/hr   08/28/14 1708 Given   levofloxacin (LEVAQUIN) IVPB 750 mg 750 mg 100 mL/hr   08/28/14 2228 Given   amoxicillin-clavulanate (AUGMENTIN) 875-125 MG per tablet 1 tablet 1 tablet    08/29/14 1040 Given   amoxicillin-clavulanate (AUGMENTIN) 875-125 MG per tablet 1 tablet 1 tablet      . amoxicillin-clavulanate  1 tablet Oral Q12H  . aspirin EC  81 mg Oral Daily  . calcium carbonate  500 mg Oral BID WC  . chlorpheniramine-HYDROcodone  5 mL Oral BID  . heparin  5,000 Units Subcutaneous 3 times per day  . hydrALAZINE  50 mg Oral BID  . insulin aspart  0-15 Units Subcutaneous TID WC  . insulin aspart  0-5 Units Subcutaneous QHS  . insulin glargine  18 Units Subcutaneous BID  . ipratropium-albuterol  3 mL Nebulization TID  . levothyroxine  100 mcg Oral QAC breakfast  . levothyroxine  88 mcg Oral QAC breakfast  . losartan  50 mg Oral Daily  . pravastatin  20 mg Oral q1800  . predniSONE  40 mg Oral Q breakfast  .  sodium chloride  3 mL Intravenous Q12H  . tamoxifen  20 mg Oral Daily  . verapamil  240 mg Oral Daily    Objective: Vital signs in last 24 hours: Temp:  [97.9 F (36.6 C)-98.5 F (36.9 C)] 98.5 F (36.9 C) (06/30 1249) Pulse Rate:  [77-84] 80 (06/30 1249) Resp:  [18-20] 20 (06/30 1249) BP: (134-184)/(44-63) 134/44 mmHg (06/30 1249) SpO2:  [94 %-97 %] 97 % (06/30 1249) Constitutional: oriented to person, place, and time. Chronically ill appearing, on O2 HENT: [place]/AT, PERRLA, no scleral icterus Mouth/Throat: Oropharynx is clear and moist. No oropharyngeal exudate.  Cardiovascular: Normal rate, regular rhythm and normal heart sounds. Exam reveals no gallop and no friction rub.  No murmur heard.  Pulmonary/Chest: rhonchi bil bases Neck - supple, no nuchal rigidity Abdominal: Soft. Bowel sounds are normal. exhibits no distension. There is no tenderness.  Lymphadenopathy: no cervical adenopathy. No axillary adenopathy Neurological: alert and oriented to person, place, and time.  Skin: Skin is warm and dry. No rash noted. No erythema.  Psychiatric: a normal mood and affect. behavior is normal.  Lab Results  Recent Labs  08/28/14 0653 08/29/14 0637  WBC 18.6* 18.9*  HGB 11.0* 11.6*  HCT 35.2 35.8  NA 141 142  K 4.3 5.1  CL 108 112*  CO2 23 21*  BUN 84* 84*  CREATININE  2.21* 2.17*    Microbiology: Results for orders placed or performed during the hospital encounter of 08/21/14  Culture, sputum-assessment     Status: None   Collection Time: 08/22/14  9:43 AM  Result Value Ref Range Status   Specimen Description EXPECTORATED SPUTUM  Final   Special Requests NONE  Final   Sputum evaluation THIS SPECIMEN IS ACCEPTABLE FOR SPUTUM CULTURE  Final   Report Status 08/22/2014 FINAL  Final  Culture, respiratory (NON-Expectorated)     Status: None   Collection Time: 08/22/14  9:43 AM  Result Value Ref Range Status   Specimen Description EXPECTORATED SPUTUM  Final   Special  Requests NONE Reflexed from [id number]  Final   Gram Stain   Final    MANY WBC SEEN MODERATE GRAM POSITIVE COCCI MODERATE GRAM NEGATIVE RODS EXCELLENT SPECIMEN - 90-100% WBCS    Culture   Final    LIGHT GROWTH STENOTROPHOMONAS MALTOPHILIA RARE MOLD REFERRED TO [place] IN [place], [place] IDENTIFICATION/CONFIRMATION    Report Status 08/27/2014 FINAL  Final   Organism ID, Bacteria STENOTROPHOMONAS MALTOPHILIA  Final      Susceptibility   Stenotrophomonas maltophilia - MIC*    LEVOFLOXACIN Value in next row Sensitive      SENSITIVE1    TRIMETH/SULFA Value in next row Sensitive      SENSITIVE<=20    * LIGHT GROWTH STENOTROPHOMONAS MALTOPHILIA    Studies/Results: Ct Chest Wo Contrast  08/28/2014   CLINICAL DATA:  Limited hospital 1 week prior for pneumonia. History of lung cancer and breast cancer.  EXAM: CT CHEST WITHOUT CONTRAST  TECHNIQUE: Multidetector CT imaging of the chest was performed following the standard protocol without IV contrast.  COMPARISON:  Radiographs 08/26/2014, CT 06/24/2014, PET-CT 01/15/2014  FINDINGS: Mediastinum/Nodes: No axillary or supraclavicular lymphadenopathy. No mediastinal hilar adenopathy. No pericardial fluid esophagus is normal  Lungs/Pleura: Extensive centrilobular emphysema in the upper lobes. There is peripheral reticulation and peribronchial thickening in the lingula. There is bandlike consolidation within the lateral left lower lobe with some of more focal consolidation posterior left lower lobe. (Image 46, series 3 and image 33, series 3).  There is a fine branching nodular pattern posterior right lung base on image 39, series 3. These lower lobe findings are similar to CT exam of 06/24/2014.  There is a rounded pleural-based mass in the superior medial right hemi thorax measuring 31 x 19 mm. This lesion has simple fluid attenuation and was not significantly metabolic on comparison PET-CT scan. The lesion is unchanged in  size from the PET-CT exam.  Upper abdomen: Limited view of the liver, kidneys, pancreas are unremarkable. Normal adrenal glands.  Musculoskeletal: No aggressive osseous lesion.  IMPRESSION: 1. Chronic nodular and reticular nodular airspace opacities and interstitial opacities in the left lower lobe and lingula suggests chronic infection. No significant change from CT of 06/24/2014. 2. Mild branching nodular airspace disease in the right lower lobe is new. This could represent early or resolving pneumonia. 3. 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   Electronically Signed   By: Suzy Bouchard M.D.   On: 08/28/2014 16:49    Assessment/Plan: Whitney Wright is a 74 y.o. female with recurrent non-small cell lung cancer who was admitted 6/22 with acute on chronic respiratory failure secondary to COPD and left lung consolidation. Sputum culture has yielded stentotrophomans and mold. She has been treated initially with levofloxacin then changed to zosyn with  some clinical improvement but remains with cough and leukocytosis. CT scan shows relatively stable but does have reticulonodular pattern.  Could be MAI but given other issues would unlikely be a treatment candidate.  The mold is yet to be speciated but is likely aspergillus.    Recommendations At this point would suggest 2 weeks more of levo for the [facility].  Will need fu imaging of chest as per Dr Ma Hillock If worsens would suggest bronch and send for fungal, AFB and routine.  Thank you very much for the consult. Will follow with you.  [place], [place]   08/29/2014, 12:52 PM

## 2014-08-29 NOTE — Plan of Care (Signed)
Problem: Discharge Progression Outcomes Goal: Other Discharge Outcomes/Goals Outcome: Progressing Plan of care progress to goal for: 1. Pain-pt c/o pain x1, prn meds given with improvement 2. Hemodynamically-             -VSS, pt remains afebrile this shift             -PO ABT continues 3. Complications-no evidence of this shift 4. Diet-pt tolerating diet this shift 5. Activity-pt up to [facility] with 1 assist

## 2014-08-29 NOTE — Clinical Social Work Note (Signed)
Pt is ready for discharge today to [facility]. [employer] Josem Kaufmann has been obtained Josem Kaufmann # [id number]). Pt and daughter are agreeable to discharge plan. RN will call report and EMS will provide transportation. CSW is signing off as no further needs identified.   Darden Dates, MSW, LCSW Clinical Social Worker  [phone]

## 2014-08-29 NOTE — Discharge Summary (Signed)
Physician Discharge Summary  Whitney Wright LKT:[id number] DOB: 1940/12/23 DOA: 08/21/2014  PCP: Tracie Harrier, MD  Admit date: 08/21/2014 Discharge date: 08/29/2014  Time spent: 35  minutes  Recommendations for Outpatient Follow-up:  1. Follow up with Dr.Tanesha Arambula in 3 -4 weeks . 2. Keep follow up at the [facility] with Dr.Pandit as scheduled   Discharge Diagnoses:   1 Acute on chronic respiratory failure secondary to left sided Pneumonia 2 HTN 3 Hypothyroidism 4 Type 2 DM 5 CKD 6 Lung Ca  7 Weakness  8 Left side chest pain secondary to Pneumonia   Discharge Condition: Fair  Diet recommendation: 1,800 cals  2 gms sodium  Filed Weights   08/21/14 1528 08/21/14 1810  Weight: 70.478 kg (155 lb 6 oz) 70.308 kg (155 lb)    History of present illness:  Whitney Wright is a 74 year old female, with a history of lung cancer,advanced COPD, Chronic respiratory failure Type 2 diabetes, Hypertension, Hypothyroidism, who presented to the [facility] clinic with left-sided chest pain,increasing shortness of breath and ankle edema , Patient had been seen in the ED a few days back and a CT scan that time showed evidence of consolidation water and left lower lobe lung . Patient was hypoxemic with O2 sat of 82% on 2 L mask, improved to 92% on 3 L nasal cannula.  Hospital Course:  Patient was admitted [facility] and started on IV Solu-Medrol as well as IV Levaquin. She received intravenous fluids and supplemental oxygen. She was also seen in consultation by Dr. Caryn Section infectious disease specialist and oncologist Dr. Ma Hillock.. A repeat CT scan showed evidence of nodular and reticular nodular opacities and air space disease in the left lower lobe and lingula. Patient's sputum grew light growth of Stenotrophomonas maltophilia and rare mold was referred not getting the state lab in [facility] for ID. Patient did make some progress she received Percocet for pain  relief and she was seen by physical therapy and felt that she would benefit from skilled nursing,.She was transferred in stable condition to skilled nursing facility advised complete a course of Levaquin for 2 further weeks. Advised also a tapering schedule of Prednisone She was advised to repeat chest x-ray done in 1-2 weeks and also follow up with her oncologist Dr. Efraim Kaufmann also follow me Dr. Andree Moro in which 2 weeks' time    Check CBC and Met-b in 1 week   Consultations: Dr. [name]- ID Dr. Seleta Rhymes  Discharge Exam: Filed Vitals:   08/29/14 1249  BP: 134/44  Pulse: 80  Temp: 98.5 F (36.9 C)  Resp: 20    General: Not in distress Cardiovascular: S1 S2 Respiratory: Rhonchi. Crackles Left base posteriorly  Discharge Instructions    Current Discharge Medication List    START taking these medications   Details  levofloxacin (LEVAQUIN) 250 MG tablet Take 1 tablet (250 mg total) by mouth daily. Qty: 14 tablet, Refills: 0    oxyCODONE-acetaminophen (PERCOCET/ROXICET) 5-325 MG per tablet Take 1 tablet by mouth every 6 (six) hours as needed (chest pain). Qty: 30 tablet, Refills: 0    predniSONE (DELTASONE) 10 MG tablet Prednisone 10 mg tabs  Take 4 tabs po once daily for 3 days Take 3 tabs po once daily for 3 days Take 2 tabs po once daily for 3 days Take 1 tab po once daily for 3 days And then stop  Total: 30 tablets Qty: 30 tablet, Refills: 0      CONTINUE these medications which have CHANGED  Details  hydrALAZINE (APRESOLINE) 50 MG tablet Take 1 tablet (50 mg total) by mouth 2 (two) times daily. Qty: 60 tablet, Refills: 3      CONTINUE these medications which have NOT CHANGED   Details  ADVAIR DISKUS 250-50 MCG/DOSE AEPB Inhale 1 puff into the lungs 2 (two) times daily.  Refills: 5    albuterol (PROVENTIL HFA;VENTOLIN HFA) 108 (90 BASE) MCG/ACT inhaler Inhale into the lungs every 6 (six) hours as needed for wheezing or shortness of breath.     ALPRAZolam (XANAX) 0.25 MG tablet Take 0.25 mg by mouth at bedtime as needed.  Refills: 3    aspirin 81 MG tablet Take 81 mg by mouth daily.    calcium carbonate (OS-CAL) 600 MG TABS tablet Take 600 mg by mouth 2 (two) times daily with a meal.    chlorpheniramine-HYDROcodone (TUSSIONEX PENNKINETIC ER) 10-8 MG/5ML SUER Take 5 mLs by mouth 2 (two) times daily. Qty: 140 mL, Refills: 0    Cholecalciferol 1000 UNITS TBDP Take by mouth daily.    insulin regular (NOVOLIN R,HUMULIN R) 100 units/mL injection Inject into the skin as needed for high blood sugar.    ipratropium-albuterol (DUONEB) 0.5-2.5 (3) MG/3ML SOLN Refills: 5    LANTUS SOLOSTAR 100 UNIT/ML SOPN Inject 18 Units into the skin 2 (two) times daily.    !! levothyroxine (SYNTHROID, LEVOTHROID) 100 MCG tablet Take 100 mcg by mouth daily before breakfast.     !! levothyroxine (SYNTHROID, LEVOTHROID) 88 MCG tablet Take 88 mcg by mouth daily before breakfast.     losartan (COZAAR) 100 MG tablet Take 1 tablet by mouth daily.    lovastatin (MEVACOR) 20 MG tablet Take 1 tablet by mouth daily.    tamoxifen (NOLVADEX) 20 MG tablet Take 20 mg by mouth daily.    tiotropium (SPIRIVA) 18 MCG inhalation capsule Place 18 mcg into inhaler and inhale daily.    verapamil (CALAN-SR) 120 MG CR tablet Take 2 tablets by mouth daily.     !! - Potential duplicate medications found. Please discuss with provider.    STOP taking these medications     hydrochlorothiazide (HYDRODIURIL) 25 MG tablet      predniSONE (STERAPRED UNI-PAK 21 TAB) 10 MG (21) TBPK tablet      oxyCODONE (OXY IR/ROXICODONE) 5 MG immediate release tablet        Allergies  Allergen Reactions  . Macrodantin [Nitrofurantoin] Rash      The results of significant diagnostics from this hospitalization (including imaging, microbiology, ancillary and laboratory) are listed below for reference.    Significant Diagnostic Studies: Dg Chest 2 View  08/26/2014   CLINICAL  DATA:  Pneumonia. History of COPD, lung cancer and prior radiation.  EXAM: CHEST  2 VIEW  COMPARISON:  08/21/2014  FINDINGS: Consolidation in the left lower lobe and lingula compatible with pneumonia. Mass in the medial right upper hemithorax again noted, unchanged. Heart is upper limits normal in size. No visible effusions or acute bony abnormality.  IMPRESSION: Stable left lower lobe airspace opacity concerning for pneumonia.  Stable medial right upper chest paraspinal mass.   Electronically Signed   By: Rolm Baptise M.D.   On: 08/26/2014 10:06   X-ray Chest Pa And Lateral  08/21/2014   CLINICAL DATA:  Shortness of breath, cough, and chest congestion. Emphysema.  EXAM: CHEST  2 VIEW  COMPARISON:  Chest x-ray and chest CT dated 08/17/2014  FINDINGS: There is slight cardiomegaly with calcification of the thoracic aorta. Persistent patchy infiltrate in  the lingula and left lower lobe, unchanged. COPD. Chronic peribronchial thickening on the right. Diffuse osteopenia. Unchanged right apical paraspinal mass.  IMPRESSION: No change. Persistent patchy infiltrates at the left lung base superimposed on COPD. Unchanged chronic bronchitic changes. Unchanged right paraspinal mass.   Electronically Signed   By: Lorriane Shire M.D.   On: 08/21/2014 19:06   Dg Chest 2 View  08/17/2014   CLINICAL DATA:  Worsening chest pain and shortness of breath, acute onset. Initial encounter.  EXAM: CHEST  2 VIEW  COMPARISON:  Chest radiograph performed 01/30/2014  FINDINGS: The lungs are well-aerated. Mildly worsened left basilar airspace opacification likely reflects interval progression of the patient's known lung cancer, with underlying postradiation changes. Superimposed pneumonia cannot be excluded. A small left pleural effusion is suspected. Mild right basilar atelectasis is noted. There is no evidence of pneumothorax.  The heart is borderline normal in size. No acute osseous abnormalities are seen. Right paratracheal density  reflects the patient's known benign extrapleural mass at the right lung apex.  IMPRESSION: Mildly worsened left basilar airspace opacification likely reflects interval progression of the patient's known lung cancer, with underlying postradiation changes. Superimposed pneumonia cannot be excluded. Suspect small left pleural effusion.   Electronically Signed   By: Garald Balding M.D.   On: 08/17/2014 10:48   Ct Chest Wo Contrast  08/28/2014   CLINICAL DATA:  Limited hospital 1 week prior for pneumonia. History of lung cancer and breast cancer.  EXAM: CT CHEST WITHOUT CONTRAST  TECHNIQUE: Multidetector CT imaging of the chest was performed following the standard protocol without IV contrast.  COMPARISON:  Radiographs 08/26/2014, CT 06/24/2014, PET-CT 01/15/2014  FINDINGS: Mediastinum/Nodes: No axillary or supraclavicular lymphadenopathy. No mediastinal hilar adenopathy. No pericardial fluid esophagus is normal  Lungs/Pleura: Extensive centrilobular emphysema in the upper lobes. There is peripheral reticulation and peribronchial thickening in the lingula. There is bandlike consolidation within the lateral left lower lobe with some of more focal consolidation posterior left lower lobe. (Image 46, series 3 and image 33, series 3).  There is a fine branching nodular pattern posterior right lung base on image 39, series 3. These lower lobe findings are similar to CT exam of 06/24/2014.  There is a rounded pleural-based mass in the superior medial right hemi thorax measuring 31 x 19 mm. This lesion has simple fluid attenuation and was not significantly metabolic on comparison PET-CT scan. The lesion is unchanged in size from the PET-CT exam.  Upper abdomen: Limited view of the liver, kidneys, pancreas are unremarkable. Normal adrenal glands.  Musculoskeletal: No aggressive osseous lesion.  IMPRESSION: 1. Chronic nodular and reticular nodular airspace opacities and interstitial opacities in the left lower lobe and lingula  suggests chronic infection. No significant change from CT of 06/24/2014. 2. Mild branching nodular airspace disease in the right lower lobe is new. This could represent early or resolving pneumonia. 3. 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   Electronically Signed   By: Suzy Bouchard M.D.   On: 08/28/2014 16:49   Ct Chest Wo Contrast  08/17/2014   CLINICAL DATA:  Shortness of breath and left-sided chest pain. History of breast and lung cancer.  EXAM: CT CHEST WITHOUT CONTRAST  TECHNIQUE: Multidetector CT imaging of the chest was performed following the standard protocol without IV contrast.  COMPARISON:  Chest CT 06/24/2014  FINDINGS: Stable pleural-based soft tissue lesion along the right posterior hemithorax. This measures roughly 3.4 cm in  greatest dimension and unchanged. 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 Pretracheal lymph node on sequence 2, image 22 measures 1.2 cm in the short axis and minimally changed. No significant chest lymphadenopathy. No significant axillary lymphadenopathy. Again noted is a small left pleural effusion. No significant pericardial fluid.  Again noted is a small hiatal hernia. No acute abnormality in the upper abdomen.  Trachea and mainstem bronchi are patent. Diffuse centrilobular emphysema. Stable punctate calcification in the right lower lobe on sequence 2, image 46. Stable interstitial densities along the periphery of the right lower lobe. There is slightly increased densities and consolidation at the left lung base compared to the previous chest CT. The previously described left lower lung nodule is not well demonstrated due to the consolidation in this area. There is stable pleural-based densities in the lingula. Stable punctate nodule in the right upper lobe on sequence 3, image 21.  No acute bone abnormality.  IMPRESSION: Slightly increased consolidation or volume loss in the left lower  lobe compared to the previous examination. Previously described nodule in the left lower lobe is not well demonstrated on this examination.  Chronic changes in the lingula.  Centrilobular emphysema.  Stable pleural-based lesion along the posterior right hemithorax likely represents a benign etiology, such as a neurogenic tumor.   Electronically Signed   By: Markus Daft M.D.   On: 08/17/2014 13:08    Microbiology: Recent Results (from the past 240 hour(s))  Culture, sputum-assessment     Status: None   Collection Time: 08/22/14  9:43 AM  Result Value Ref Range Status   Specimen Description EXPECTORATED SPUTUM  Final   Special Requests NONE  Final   Sputum evaluation THIS SPECIMEN IS ACCEPTABLE FOR SPUTUM CULTURE  Final   Report Status 08/22/2014 FINAL  Final  Culture, respiratory (NON-Expectorated)     Status: None   Collection Time: 08/22/14  9:43 AM  Result Value Ref Range Status   Specimen Description EXPECTORATED SPUTUM  Final   Special Requests NONE Reflexed from [id number]  Final   Gram Stain   Final    MANY WBC SEEN MODERATE GRAM POSITIVE COCCI MODERATE GRAM NEGATIVE RODS EXCELLENT SPECIMEN - 90-100% WBCS    Culture   Final    LIGHT GROWTH STENOTROPHOMONAS MALTOPHILIA RARE MOLD REFERRED TO [place] IN [place], [place] IDENTIFICATION/CONFIRMATION    Report Status 08/27/2014 FINAL  Final   Organism ID, Bacteria STENOTROPHOMONAS MALTOPHILIA  Final      Susceptibility   Stenotrophomonas maltophilia - MIC*    LEVOFLOXACIN Value in next row Sensitive      SENSITIVE1    TRIMETH/SULFA Value in next row Sensitive      SENSITIVE<=20    * LIGHT GROWTH STENOTROPHOMONAS MALTOPHILIA     Labs: Basic Metabolic Panel:  Recent Labs Lab 08/25/14 0517 08/26/14 0526 08/27/14 0601 08/28/14 0653 08/29/14 0637  NA 138 138 139 141 142  K 4.4 4.3 4.4 4.3 5.1  CL 105 108 110 108 112*  CO2 24 22 23 23  21*  GLUCOSE 109* 103* 86 50* 80  BUN 94* 84* 83* 84*  84*  CREATININE 2.08* 1.78* 1.97* 2.21* 2.17*  CALCIUM 8.5* 8.5* 8.5* 8.5* 8.5*   Liver Function Tests: No results for input(s): AST, ALT, ALKPHOS, BILITOT, PROT, ALBUMIN in the last 168 hours. No results for input(s): LIPASE, AMYLASE in the last 168 hours. No results for input(s): AMMONIA in the last 168 hours. CBC:  Recent Labs Lab 08/23/14 [phone] 08/24/14  4432 08/26/14 0526 08/27/14 0601 08/28/14 0653 08/29/14 0637  WBC 10.9 15.2* 17.3* 18.7* 18.6* 18.9*  NEUTROABS 10.2* 13.3*  --  16.5* 16.2* 16.9*  HGB 11.5* 10.5* 11.0* 11.2* 11.0* 11.6*  HCT 36.2 33.4* 34.5* 35.2 35.2 35.8  MCV 83.9 83.1 82.7 83.7 84.0 83.8  PLT 182 159 148* 141* 144* 112*   Cardiac Enzymes: No results for input(s): CKTOTAL, CKMB, CKMBINDEX, TROPONINI in the last 168 hours. BNP: BNP (last 3 results)  Recent Labs  08/17/14 1006  BNP 390.0*    ProBNP (last 3 results) No results for input(s): PROBNP in the last 8760 hours.  CBG:  Recent Labs Lab 08/28/14 1111 08/28/14 1622 08/28/14 2148 08/29/14 0722 08/29/14 1123  GLUCAP 151* 215* 370* 77 276*       Signed:  Sadonna Kotara   08/29/2014, 1:30 PM

## 2014-08-30 ENCOUNTER — Encounter
Admission: RE | Admit: 2014-08-30 | Discharge: 2014-08-30 | Disposition: A | Discharge status: Home / Self Care | Payer: Commercial Managed Care - HMO | Source: Ambulatory Visit | Attending: Internal Medicine | Admitting: Internal Medicine

## 2014-08-30 DIAGNOSIS — N189 Chronic kidney disease, unspecified: Secondary | ICD-10-CM | POA: Insufficient documentation

## 2014-08-30 DIAGNOSIS — D649 Anemia, unspecified: Secondary | ICD-10-CM | POA: Insufficient documentation

## 2014-09-10 DIAGNOSIS — N189 Chronic kidney disease, unspecified: Secondary | ICD-10-CM | POA: Diagnosis not present

## 2014-09-10 DIAGNOSIS — D649 Anemia, unspecified: Secondary | ICD-10-CM | POA: Diagnosis present

## 2014-09-10 LAB — TSH: TSH: 1.385 u[IU]/mL (ref 0.350–4.500)

## 2014-09-10 LAB — COMPREHENSIVE METABOLIC PANEL
ALBUMIN: 2.2 g/dL — AB (ref 3.5–5.0)
ALT: 29 U/L (ref 14–54)
AST: 21 U/L (ref 15–41)
Alkaline Phosphatase: 55 U/L (ref 38–126)
Anion gap: 10 (ref 5–15)
BUN: 60 mg/dL — AB (ref 6–20)
CO2: 27 mmol/L (ref 22–32)
Calcium: 7.9 mg/dL — ABNORMAL LOW (ref 8.9–10.3)
Chloride: 103 mmol/L (ref 101–111)
Creatinine, Ser: 2.05 mg/dL — ABNORMAL HIGH (ref 0.44–1.00)
GFR calc non Af Amer: 22 mL/min — ABNORMAL LOW (ref >60)
GFR, EST AFRICAN AMERICAN: 26 mL/min — AB (ref >60)
GLUCOSE: 86 mg/dL (ref 65–99)
Potassium: 4.7 mmol/L (ref 3.5–5.1)
Sodium: 140 mmol/L (ref 135–145)
Total Bilirubin: 0.4 mg/dL (ref 0.3–1.2)
Total Protein: 4.6 g/dL — ABNORMAL LOW (ref 6.5–8.1)

## 2014-09-10 LAB — CBC WITH DIFFERENTIAL/PLATELET
Basophils Absolute: 0 10*3/uL (ref 0–0.1)
Basophils Relative: 1 %
EOS PCT: 3 %
Eosinophils Absolute: 0.3 10*3/uL (ref 0–0.7)
HEMATOCRIT: 32.3 % — AB (ref 35.0–47.0)
Hemoglobin: 10.7 g/dL — ABNORMAL LOW (ref 12.0–16.0)
LYMPHS PCT: 6 %
Lymphs Abs: 0.6 10*3/uL — ABNORMAL LOW (ref 1.0–3.6)
MCH: 28 pg (ref 26.0–34.0)
MCHC: 32.9 g/dL (ref 32.0–36.0)
MCV: 84.9 fL (ref 80.0–100.0)
Monocytes Absolute: 0.5 10*3/uL (ref 0.2–0.9)
Monocytes Relative: 5 %
Neutro Abs: 9.1 10*3/uL — ABNORMAL HIGH (ref 1.4–6.5)
Neutrophils Relative %: 85 %
PLATELETS: 113 10*3/uL — AB (ref 150–440)
RBC: 3.81 MIL/uL (ref 3.80–5.20)
RDW: 15.6 % — ABNORMAL HIGH (ref 11.5–14.5)
WBC: 10.5 10*3/uL (ref 3.6–11.0)

## 2014-09-15 ENCOUNTER — Encounter: Payer: Self-pay | Admitting: Emergency Medicine

## 2014-09-15 ENCOUNTER — Inpatient Hospital Stay
Admission: EM | Admit: 2014-09-15 | Discharge: 2014-09-21 | DRG: 189 | Disposition: A | Discharge status: Skilled Nursing Facility | Payer: Commercial Managed Care - HMO | Attending: Internal Medicine | Admitting: Internal Medicine

## 2014-09-15 ENCOUNTER — Emergency Department: Payer: Commercial Managed Care - HMO

## 2014-09-15 DIAGNOSIS — C3492 Malignant neoplasm of unspecified part of left bronchus or lung: Secondary | ICD-10-CM

## 2014-09-15 DIAGNOSIS — Z8249 Family history of ischemic heart disease and other diseases of the circulatory system: Secondary | ICD-10-CM | POA: Diagnosis not present

## 2014-09-15 DIAGNOSIS — E1122 Type 2 diabetes mellitus with diabetic chronic kidney disease: Secondary | ICD-10-CM | POA: Diagnosis present

## 2014-09-15 DIAGNOSIS — Z803 Family history of malignant neoplasm of breast: Secondary | ICD-10-CM | POA: Diagnosis not present

## 2014-09-15 DIAGNOSIS — J9621 Acute and chronic respiratory failure with hypoxia: Principal | ICD-10-CM | POA: Diagnosis present

## 2014-09-15 DIAGNOSIS — M810 Age-related osteoporosis without current pathological fracture: Secondary | ICD-10-CM | POA: Diagnosis present

## 2014-09-15 DIAGNOSIS — R0902 Hypoxemia: Secondary | ICD-10-CM

## 2014-09-15 DIAGNOSIS — Z85118 Personal history of other malignant neoplasm of bronchus and lung: Secondary | ICD-10-CM | POA: Diagnosis not present

## 2014-09-15 DIAGNOSIS — J9620 Acute and chronic respiratory failure, unspecified whether with hypoxia or hypercapnia: Secondary | ICD-10-CM | POA: Diagnosis present

## 2014-09-15 DIAGNOSIS — R079 Chest pain, unspecified: Secondary | ICD-10-CM | POA: Diagnosis not present

## 2014-09-15 DIAGNOSIS — Z66 Do not resuscitate: Secondary | ICD-10-CM | POA: Diagnosis present

## 2014-09-15 DIAGNOSIS — R921 Mammographic calcification found on diagnostic imaging of breast: Secondary | ICD-10-CM | POA: Diagnosis not present

## 2014-09-15 DIAGNOSIS — Z87891 Personal history of nicotine dependence: Secondary | ICD-10-CM

## 2014-09-15 DIAGNOSIS — E039 Hypothyroidism, unspecified: Secondary | ICD-10-CM | POA: Diagnosis present

## 2014-09-15 DIAGNOSIS — J90 Pleural effusion, not elsewhere classified: Secondary | ICD-10-CM | POA: Diagnosis present

## 2014-09-15 DIAGNOSIS — Z7982 Long term (current) use of aspirin: Secondary | ICD-10-CM | POA: Diagnosis not present

## 2014-09-15 DIAGNOSIS — C3432 Malignant neoplasm of lower lobe, left bronchus or lung: Secondary | ICD-10-CM | POA: Diagnosis not present

## 2014-09-15 DIAGNOSIS — K621 Rectal polyp: Secondary | ICD-10-CM | POA: Diagnosis present

## 2014-09-15 DIAGNOSIS — Z9981 Dependence on supplemental oxygen: Secondary | ICD-10-CM

## 2014-09-15 DIAGNOSIS — N184 Chronic kidney disease, stage 4 (severe): Secondary | ICD-10-CM | POA: Diagnosis present

## 2014-09-15 DIAGNOSIS — Z853 Personal history of malignant neoplasm of breast: Secondary | ICD-10-CM

## 2014-09-15 DIAGNOSIS — J189 Pneumonia, unspecified organism: Secondary | ICD-10-CM | POA: Diagnosis not present

## 2014-09-15 DIAGNOSIS — N179 Acute kidney failure, unspecified: Secondary | ICD-10-CM | POA: Diagnosis present

## 2014-09-15 DIAGNOSIS — J449 Chronic obstructive pulmonary disease, unspecified: Secondary | ICD-10-CM | POA: Diagnosis present

## 2014-09-15 DIAGNOSIS — M1990 Unspecified osteoarthritis, unspecified site: Secondary | ICD-10-CM | POA: Diagnosis present

## 2014-09-15 DIAGNOSIS — N189 Chronic kidney disease, unspecified: Secondary | ICD-10-CM | POA: Diagnosis present

## 2014-09-15 DIAGNOSIS — C3490 Malignant neoplasm of unspecified part of unspecified bronchus or lung: Secondary | ICD-10-CM | POA: Insufficient documentation

## 2014-09-15 DIAGNOSIS — I129 Hypertensive chronic kidney disease with stage 1 through stage 4 chronic kidney disease, or unspecified chronic kidney disease: Secondary | ICD-10-CM | POA: Diagnosis present

## 2014-09-15 DIAGNOSIS — D649 Anemia, unspecified: Secondary | ICD-10-CM | POA: Diagnosis present

## 2014-09-15 DIAGNOSIS — R0602 Shortness of breath: Secondary | ICD-10-CM | POA: Diagnosis not present

## 2014-09-15 DIAGNOSIS — Z833 Family history of diabetes mellitus: Secondary | ICD-10-CM

## 2014-09-15 DIAGNOSIS — E8770 Fluid overload, unspecified: Secondary | ICD-10-CM | POA: Diagnosis present

## 2014-09-15 HISTORY — DX: Chronic kidney disease, unspecified: N18.9

## 2014-09-15 HISTORY — DX: Reserved for inherently not codable concepts without codable children: IMO0001

## 2014-09-15 HISTORY — DX: Chronic obstructive pulmonary disease, unspecified: J44.9

## 2014-09-15 LAB — GLUCOSE, CAPILLARY
Glucose-Capillary: 266 mg/dL — ABNORMAL HIGH (ref 65–99)
Glucose-Capillary: 384 mg/dL — ABNORMAL HIGH (ref 65–99)

## 2014-09-15 LAB — CBC
HCT: 30.3 % — ABNORMAL LOW (ref 35.0–47.0)
Hemoglobin: 9.9 g/dL — ABNORMAL LOW (ref 12.0–16.0)
MCH: 27.3 pg (ref 26.0–34.0)
MCHC: 32.6 g/dL (ref 32.0–36.0)
MCV: 83.8 fL (ref 80.0–100.0)
PLATELETS: 154 10*3/uL (ref 150–440)
RBC: 3.62 MIL/uL — ABNORMAL LOW (ref 3.80–5.20)
RDW: 15 % — AB (ref 11.5–14.5)
WBC: 7.6 10*3/uL (ref 3.6–11.0)

## 2014-09-15 LAB — COMPREHENSIVE METABOLIC PANEL
ALBUMIN: 2.1 g/dL — AB (ref 3.5–5.0)
ALT: 26 U/L (ref 14–54)
AST: 20 U/L (ref 15–41)
Alkaline Phosphatase: 69 U/L (ref 38–126)
Anion gap: 9 (ref 5–15)
BILIRUBIN TOTAL: 0.3 mg/dL (ref 0.3–1.2)
BUN: 79 mg/dL — ABNORMAL HIGH (ref 6–20)
CO2: 28 mmol/L (ref 22–32)
CREATININE: 2.29 mg/dL — AB (ref 0.44–1.00)
Calcium: 7.8 mg/dL — ABNORMAL LOW (ref 8.9–10.3)
Chloride: 95 mmol/L — ABNORMAL LOW (ref 101–111)
GFR calc Af Amer: 23 mL/min — ABNORMAL LOW (ref >60)
GFR calc non Af Amer: 20 mL/min — ABNORMAL LOW (ref >60)
Glucose, Bld: 221 mg/dL — ABNORMAL HIGH (ref 65–99)
Potassium: 4.3 mmol/L (ref 3.5–5.1)
Sodium: 132 mmol/L — ABNORMAL LOW (ref 135–145)
TOTAL PROTEIN: 5.5 g/dL — AB (ref 6.5–8.1)

## 2014-09-15 LAB — URINALYSIS COMPLETE WITH MICROSCOPIC (ARMC ONLY)
BILIRUBIN URINE: NEGATIVE
GLUCOSE, UA: 50 mg/dL — AB
Hgb urine dipstick: NEGATIVE
KETONES UR: NEGATIVE mg/dL
Leukocytes, UA: NEGATIVE
Nitrite: NEGATIVE
PROTEIN: 100 mg/dL — AB
SPECIFIC GRAVITY, URINE: 1.008 (ref 1.005–1.030)
pH: 6 (ref 5.0–8.0)

## 2014-09-15 LAB — TSH: TSH: 1.468 u[IU]/mL (ref 0.350–4.500)

## 2014-09-15 LAB — TROPONIN I: Troponin I: <0.03 ng/mL (ref <0.031)

## 2014-09-15 LAB — BRAIN NATRIURETIC PEPTIDE: B Natriuretic Peptide: 558 pg/mL — ABNORMAL HIGH (ref 0.0–100.0)

## 2014-09-15 MED ORDER — ACETAMINOPHEN 650 MG RE SUPP: 650.0000 mg | Freq: Four times a day (QID) | RECTAL | Status: DC | PRN

## 2014-09-15 MED ORDER — IPRATROPIUM-ALBUTEROL 0.5-2.5 (3) MG/3ML IN SOLN
3.0000 mL | Freq: Once | RESPIRATORY_TRACT | Status: AC
  Administered 2014-09-15: 3 mL via RESPIRATORY_TRACT
  Filled 2014-09-15: qty 3

## 2014-09-15 MED ORDER — INSULIN ASPART 100 UNIT/ML ~~LOC~~ SOLN
0.0000 [IU] | Freq: Three times a day (TID) | SUBCUTANEOUS | Status: DC
  Administered 2014-09-16: 17:00:00 5 [IU] via SUBCUTANEOUS
  Administered 2014-09-16: 09:00:00 3 [IU] via SUBCUTANEOUS
  Administered 2014-09-16: 13:00:00 5 [IU] via SUBCUTANEOUS
  Administered 2014-09-17: 17:00:00 1 [IU] via SUBCUTANEOUS
  Administered 2014-09-17 – 2014-09-18 (×4): 2 [IU] via SUBCUTANEOUS
  Administered 2014-09-19 – 2014-09-20 (×2): 1 [IU] via SUBCUTANEOUS
  Administered 2014-09-20: 12:00:00 3 [IU] via SUBCUTANEOUS
  Administered 2014-09-21: 12:00:00 7 [IU] via SUBCUTANEOUS
  Filled 2014-09-15: qty 2
  Filled 2014-09-15: qty 3
  Filled 2014-09-15: qty 7
  Filled 2014-09-15: qty 2
  Filled 2014-09-15: qty 3
  Filled 2014-09-15 (×2): qty 5
  Filled 2014-09-15: qty 1
  Filled 2014-09-15: qty 2
  Filled 2014-09-15: qty 1
  Filled 2014-09-15: qty 2
  Filled 2014-09-15: qty 1

## 2014-09-15 MED ORDER — LEVOTHYROXINE SODIUM 100 MCG PO TABS
100.0000 ug | ORAL_TABLET | Freq: Every day | ORAL | Status: DC
  Administered 2014-09-16 – 2014-09-21 (×5): 100 ug via ORAL
  Filled 2014-09-15 (×5): qty 1

## 2014-09-15 MED ORDER — TIOTROPIUM BROMIDE MONOHYDRATE 18 MCG IN CAPS
18.0000 ug | ORAL_CAPSULE | Freq: Every day | RESPIRATORY_TRACT | Status: DC
  Administered 2014-09-16 – 2014-09-21 (×6): 18 ug via RESPIRATORY_TRACT
  Filled 2014-09-15 (×3): qty 5

## 2014-09-15 MED ORDER — INSULIN ASPART 100 UNIT/ML ~~LOC~~ SOLN
0.0000 [IU] | Freq: Every day | SUBCUTANEOUS | Status: DC
  Administered 2014-09-16: 5 [IU] via SUBCUTANEOUS
  Administered 2014-09-17 – 2014-09-20 (×2): 2 [IU] via SUBCUTANEOUS
  Filled 2014-09-15 (×3): qty 2
  Filled 2014-09-15: qty 3

## 2014-09-15 MED ORDER — ONDANSETRON HCL 4 MG/2ML IJ SOLN: 4.0000 mg | Freq: Four times a day (QID) | INTRAMUSCULAR | Status: DC | PRN

## 2014-09-15 MED ORDER — HEPARIN SODIUM (PORCINE) 5000 UNIT/ML IJ SOLN
5000.0000 [IU] | Freq: Three times a day (TID) | INTRAMUSCULAR | Status: DC
  Administered 2014-09-16 – 2014-09-21 (×16): 5000 [IU] via SUBCUTANEOUS
  Filled 2014-09-15 (×16): qty 1

## 2014-09-15 MED ORDER — ASPIRIN EC 81 MG PO TBEC
81.0000 mg | DELAYED_RELEASE_TABLET | Freq: Every day | ORAL | Status: DC
  Administered 2014-09-16 – 2014-09-21 (×6): 81 mg via ORAL
  Filled 2014-09-15 (×7): qty 1

## 2014-09-15 MED ORDER — MOMETASONE FURO-FORMOTEROL FUM 100-5 MCG/ACT IN AERO
2.0000 | INHALATION_SPRAY | Freq: Two times a day (BID) | RESPIRATORY_TRACT | Status: DC
  Administered 2014-09-16 – 2014-09-21 (×11): 2 via RESPIRATORY_TRACT
  Filled 2014-09-15 (×3): qty 8.8

## 2014-09-15 MED ORDER — LEVOTHYROXINE SODIUM 88 MCG PO TABS
88.0000 ug | ORAL_TABLET | Freq: Every day | ORAL | Status: DC
  Administered 2014-09-16 – 2014-09-21 (×5): 88 ug via ORAL
  Filled 2014-09-15 (×5): qty 1

## 2014-09-15 MED ORDER — ALBUTEROL SULFATE (2.5 MG/3ML) 0.083% IN NEBU
2.5000 mg | INHALATION_SOLUTION | Freq: Four times a day (QID) | RESPIRATORY_TRACT | Status: DC | PRN
Start: 2014-09-15 — End: 2014-09-21

## 2014-09-15 MED ORDER — LOSARTAN POTASSIUM 50 MG PO TABS
100.0000 mg | ORAL_TABLET | Freq: Every day | ORAL | Status: DC
  Administered 2014-09-16 – 2014-09-21 (×6): 100 mg via ORAL
  Filled 2014-09-15 (×7): qty 2

## 2014-09-15 MED ORDER — ALBUTEROL SULFATE HFA 108 (90 BASE) MCG/ACT IN AERS: 2.0000 | INHALATION_SPRAY | Freq: Four times a day (QID) | RESPIRATORY_TRACT | Status: DC | PRN

## 2014-09-15 MED ORDER — FUROSEMIDE 10 MG/ML IJ SOLN
40.0000 mg | Freq: Two times a day (BID) | INTRAMUSCULAR | Status: DC
  Administered 2014-09-16 (×2): 40 mg via INTRAVENOUS
  Filled 2014-09-15 (×2): qty 4

## 2014-09-15 MED ORDER — ACETAMINOPHEN 325 MG PO TABS
650.0000 mg | ORAL_TABLET | Freq: Four times a day (QID) | ORAL | Status: DC | PRN
  Administered 2014-09-17 – 2014-09-20 (×5): 650 mg via ORAL
  Filled 2014-09-15 (×5): qty 2

## 2014-09-15 MED ORDER — MAGNESIUM HYDROXIDE 400 MG/5ML PO SUSP: 30.0000 mL | Freq: Every day | ORAL | Status: DC | PRN

## 2014-09-15 MED ORDER — ALPRAZOLAM 0.25 MG PO TABS: 0.2500 mg | ORAL_TABLET | Freq: Every evening | ORAL | Status: DC | PRN

## 2014-09-15 MED ORDER — HYDRALAZINE HCL 50 MG PO TABS
50.0000 mg | ORAL_TABLET | Freq: Two times a day (BID) | ORAL | Status: DC
  Administered 2014-09-16 – 2014-09-21 (×11): 50 mg via ORAL
  Filled 2014-09-15 (×12): qty 1

## 2014-09-15 MED ORDER — ONDANSETRON HCL 4 MG PO TABS: 4.0000 mg | ORAL_TABLET | Freq: Four times a day (QID) | ORAL | Status: DC | PRN

## 2014-09-15 MED ORDER — VERAPAMIL HCL ER 120 MG PO TBCR
240.0000 mg | EXTENDED_RELEASE_TABLET | Freq: Every day | ORAL | Status: DC
  Administered 2014-09-16 – 2014-09-21 (×6): 240 mg via ORAL
  Filled 2014-09-15 (×7): qty 2

## 2014-09-15 MED ORDER — FUROSEMIDE 10 MG/ML IJ SOLN
40.0000 mg | Freq: Once | INTRAMUSCULAR | Status: AC
  Administered 2014-09-15: 40 mg via INTRAVENOUS
  Filled 2014-09-15: qty 4

## 2014-09-15 MED ORDER — INSULIN GLARGINE 100 UNIT/ML ~~LOC~~ SOLN
18.0000 [IU] | Freq: Every day | SUBCUTANEOUS | Status: DC
  Administered 2014-09-16 – 2014-09-20 (×6): 18 [IU] via SUBCUTANEOUS
  Filled 2014-09-15 (×9): qty 0.18

## 2014-09-15 NOTE — ED Notes (Signed)
AAOx3.  Skin warm and dry.  Dyspnea at rest.  Oxygen at 4L/[place].  Family states patient has had pneumonia for 3 weeks and has not improved.  Also, has had extra fluid on body for 2 weeks.  Family also reports recent short term memory recall. Patient does not remember breakfast this morning.  Initial oxygen saturations 76%.

## 2014-09-15 NOTE — ED Provider Notes (Signed)
[facility] Emergency Department Provider Note  ____________________________________________  Time seen: On arrival, via EMS  I have reviewed the triage vital signs and the nursing notes.   HISTORY  Chief Complaint Shortness of Breath   HPI Whitney Wright is a 74 y.o. female who presents with shortness of breath.Reportedly patient is been treated for pneumonia for 3 weeks but has not improved, has also apparently been volume overloaded reportedly. EMS reports initial pulse ox 76% on 2L. Patient also with a history of lung cancer per medical records. She denies chest pain she denies fevers, she denies chills.     Past Medical History  Diagnosis Date  . Hypertension   . Cellulitis and abscess of trunk   . Thyroid disease     hypo  . Arthritis   . Personal history of malignant neoplasm of breast   . Diabetes mellitus without complication   . Senile osteoporosis   . 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 ER 50%, PR 0, HER-2/neu nonamplified.. Tamoxifen chosen as bone density showed severe osteoporosis.  . Lung cancer, upper lobe February 2015    Adenocarcinoma with lepidic pattern, second primary fall 2015  . Breast cancer     Patient Active Problem List   Diagnosis Date Noted  . Acute on chronic respiratory failure 08/21/2014  . Lung cancer, upper lobe 04/01/2013  . Personal history of breast cancer 08/28/2012  . Family history of breast cancer 08/28/2012    Past Surgical History  Procedure Laterality Date  . Abdominal hysterectomy  age 74 . Mammosite balloon placement Left 2012  . Cardiac catheterization  2014  . Breast surgery Left January 11, 2011    left breast stereotactic biopsy: DCIS  . Appendectomy      Current Outpatient Rx  Name  Route  Sig  Dispense  Refill  . ADVAIR DISKUS 250-50 MCG/DOSE AEPB    Inhalation   Inhale 1 puff into the lungs 2 (two) times daily.       5   . albuterol (PROVENTIL HFA;VENTOLIN HFA) 108 (90 BASE) MCG/ACT inhaler   Inhalation   Inhale into the lungs every 6 (six) hours as needed for wheezing or shortness of breath.         . ALPRAZolam (XANAX) 0.25 MG tablet   Oral   Take 0.25 mg by mouth at bedtime as needed.       3   . aspirin 81 MG tablet   Oral   Take 81 mg by mouth daily.         . calcium carbonate (OS-CAL) 600 MG TABS tablet   Oral   Take 600 mg by mouth 2 (two) times daily with a meal.         . chlorpheniramine-HYDROcodone (TUSSIONEX PENNKINETIC ER) 10-8 MG/5ML SUER   Oral   Take 5 mLs by mouth 2 (two) times daily.   140 mL   0   . Cholecalciferol 1000 UNITS TBDP   Oral   Take by mouth daily.         . hydrALAZINE (APRESOLINE) 50 MG tablet   Oral   Take 1 tablet (50 mg total) by mouth 2 (two) times daily.   60 tablet   3   . insulin regular (NOVOLIN R,HUMULIN R) 100 units/mL injection   Subcutaneous   Inject into the skin as needed for  high blood sugar.         . ipratropium-albuterol (DUONEB) 0.5-2.5 (3) MG/3ML SOLN            5   . LANTUS SOLOSTAR 100 UNIT/ML SOPN   Subcutaneous   Inject 18 Units into the skin 2 (two) times daily.         Marland Kitchen levofloxacin (LEVAQUIN) 250 MG tablet   Oral   Take 1 tablet (250 mg total) by mouth daily.   14 tablet   0   . levothyroxine (SYNTHROID, LEVOTHROID) 100 MCG tablet   Oral   Take 100 mcg by mouth daily before breakfast.          . levothyroxine (SYNTHROID, LEVOTHROID) 88 MCG tablet   Oral   Take 88 mcg by mouth daily before breakfast.          . losartan (COZAAR) 100 MG tablet   Oral   Take 1 tablet by mouth daily.         Marland Kitchen lovastatin (MEVACOR) 20 MG tablet   Oral   Take 1 tablet by mouth daily.         Marland Kitchen oxyCODONE-acetaminophen (PERCOCET/ROXICET) 5-325 MG per tablet   Oral   Take 1 tablet by mouth every 6 (six) hours as needed (chest  pain).   30 tablet   0   . predniSONE (DELTASONE) 10 MG tablet      Prednisone 10 mg tabs  Take 4 tabs po once daily for 3 days Take 3 tabs po once daily for 3 days Take 2 tabs po once daily for 3 days Take 1 tab po once daily for 3 days And then stop  Total: 30 tablets   30 tablet   0   . tamoxifen (NOLVADEX) 20 MG tablet   Oral   Take 20 mg by mouth daily.         Marland Kitchen tiotropium (SPIRIVA) 18 MCG inhalation capsule   Inhalation   Place 18 mcg into inhaler and inhale daily.         . verapamil (CALAN-SR) 120 MG CR tablet   Oral   Take 2 tablets by mouth daily.           Allergies Macrodantin  Family History  Problem Relation Age of Onset  . Breast cancer Daughter   . Breast cancer Mother     Social History History  Substance Use Topics  . Smoking status: Former Smoker -- 0.00 packs/day for 30 years  . Smokeless tobacco: Never Used  . Alcohol Use: No    Review of Systems  Constitutional: Negative for fever. Eyes: Negative for visual changes. ENT: Negative for sore throat Cardiovascular: Negative for chest pain. Respiratory: Positive for shortness of breath. Positive for cough Gastrointestinal: Negative for abdominal pain, vomiting and diarrhea. Genitourinary: Negative for dysuria. Musculoskeletal: Negative for back pain. Skin: Negative for rash. Neurological: Negative for headaches or focal weakness Psychiatric: Positive for anxiety  10-point ROS otherwise negative.  ____________________________________________   PHYSICAL EXAM:  VITAL SIGNS: BP 163/58 mmHg  Temp(Src) 98.9 F (37.2 C) (Oral)  Resp 18  Ht 5' (1.524 m)  Wt 170 lb (77.111 kg)  BMI 33.20 kg/m2  SpO2 92%     Constitutional: Alert and oriented. Increased work of breathing Eyes: Conjunctivae are normal.  ENT   Head: Normocephalic and atraumatic.   Mouth/Throat: Mucous membranes are moist. Cardiovascular: Normal rate, regular rhythm. Normal and symmetric distal  pulses are present in all extremities. No murmurs, rubs,  or gallops. Respiratory: Positive tachypnea, scattered Rales bibasilarly Breath sounds are clear and equal bilaterally.  Gastrointestinal: Soft and non-tender in all quadrants. No distention. There is no CVA tenderness. Genitourinary: deferred Musculoskeletal: Nontender with normal range of motion in all extremities. 2+ distal edema bilaterally. Edema to right arm as well Neurologic:  Normal speech and language. No gross focal neurologic deficits are appreciated. Skin:  Skin is warm, dry and intact. No rash noted. Psychiatric: Mood and affect are normal. Patient exhibits appropriate insight and judgment.  ____________________________________________    LABS (pertinent positives/negatives)  Labs Reviewed  CBC - Abnormal; Notable for the following:    RBC 3.62 (*)    Hemoglobin 9.9 (*)    HCT 30.3 (*)    RDW 15.0 (*)    All other components within normal limits  COMPREHENSIVE METABOLIC PANEL - Abnormal; Notable for the following:    Sodium 132 (*)    Chloride 95 (*)    Glucose, Bld 221 (*)    BUN 79 (*)    Creatinine, Ser 2.29 (*)    Calcium 7.8 (*)    Total Protein 5.5 (*)    Albumin 2.1 (*)    GFR calc non Af Amer 20 (*)    GFR calc Af Amer 23 (*)    All other components within normal limits  BRAIN NATRIURETIC PEPTIDE - Abnormal; Notable for the following:    B Natriuretic Peptide 558.0 (*)    All other components within normal limits  CULTURE, BLOOD (ROUTINE X 2)  CULTURE, BLOOD (ROUTINE X 2)  TROPONIN I  URINALYSIS COMPLETEWITH MICROSCOPIC ([facility] ONLY)    ____________________________________________   EKG  ED ECG REPORT I, Lavonia Drafts, the attending physician, personally viewed and interpreted this ECG.  Date: 09/15/2014 EKG Time: 4:53 PM Rate: 82 Rhythm: normal sinus rhythm QRS Axis: normal Intervals: normal ST/T Wave abnormalities: normal Conduction Disutrbances: none Narrative Interpretation:  unremarkable   ____________________________________________    RADIOLOGY I have personally reviewed any xrays that were ordered on this patient: Chest x-ray with patchy left-sided opacity  ____________________________________________   PROCEDURES  Procedure(s) performed: yes  Angiocath insertion Performed by: Lavonia Drafts  Consent: Verbal consent obtained. Risks and benefits: risks, benefits and alternatives were discussed Time out: Immediately prior to procedure a "time out" was called to verify the correct patient, procedure, equipment, support staff and site/side marked as required.  Preparation: Patient was prepped and draped in the usual sterile fashion.  Vein Location:right AC  Ultrasound Guided  Gauge: 18G  Normal blood return and flush without difficulty Patient tolerance: Patient tolerated the procedure well with no immediate complications.     Critical Care performed: none  ____________________________________________   INITIAL IMPRESSION / ASSESSMENT AND PLAN / ED COURSE  Pertinent labs & imaging results that were available during my care of the patient were reviewed by me and considered in my medical decision making (see chart for details).  Unclear cause of shortness of breath on arrival, suspect pulmonary edema versus pneumonia versus COPD exacerbation. We'll obtain EKG chest x-ray and lab work and reevaluate.  ----------------------------------------- 6:42 PM on 09/15/2014 -----------------------------------------  X-ray possible infection left lower lobe although also could be chronic. White count normal afebrile. But increased oxygen requirement. We will give Lasix IV and admit the patient for further evaluation  ____________________________________________   FINAL CLINICAL IMPRESSION(S) / ED DIAGNOSES  Final diagnoses:  Acute on chronic respiratory failure with hypoxia     Lavonia Drafts, MD 09/15/14 1945

## 2014-09-15 NOTE — H&P (Signed)
[facility] [place] NAME: Whitney Wright    MR#:  [id number]  DATE OF BIRTH:  [birth year]  DATE OF ADMISSION:  09/15/2014  PRIMARY CARE PHYSICIAN: Tracie Harrier, MD   REQUESTING/REFERRING PHYSICIAN: Dr. Corky Downs  CHIEF COMPLAINT:   Chief Complaint  Patient presents with  . Shortness of Breath    HISTORY OF PRESENT ILLNESS:  Whitney Wright  is a 74 y.o. female with a known history of lung cancer, COPD, chronic respiratory failure on 2 L nasal cannula at all times, diabetes mellitus type 2, hypertension and hypothyroidism who presents from [facility] skilled nursing facility with anasarca, shortness of breath oxygen saturation 76% on 2 L nasal cannula on presentation. She was admitted at this facility from June 22 to June 30 for acute on chronic respiratory failure secondary to pneumonia. Cultures at that time grew stenotrophomonas and she was treated with Levaquin. She reports that on discharge she was feeling well and was at her baseline respiratory status. However, for the past week she has become progressively more short of breath and swollen. She denies fevers chills chest pain. She's had some nausea no vomiting or diarrhea. Her weight on presentation today is 7 kg up from her most recent weight here. Her chest x-ray shows persistent lingular and left lower lobe opacity, left pleural effusion and a stable mass at the right lung apex.  PAST MEDICAL HISTORY:   Past Medical History  Diagnosis Date  . Hypertension   . Cellulitis and abscess of trunk   . Thyroid disease     hypo  . Arthritis   . Personal history of malignant neoplasm of breast   . Diabetes mellitus without complication   . Senile osteoporosis   . 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 ER 50%, PR 0, HER-2/neu nonamplified.. Tamoxifen chosen as bone  density showed severe osteoporosis.  . Lung cancer, upper lobe February 2015    Adenocarcinoma with lepidic pattern, second primary fall 2015  . Breast cancer   . COPD (chronic obstructive pulmonary disease)   . Shortness of breath dyspnea   . Chronic kidney disease     PAST SURGICAL HISTORY:   Past Surgical History  Procedure Laterality Date  . Abdominal hysterectomy  age 74 . Mammosite balloon placement Left 2012  . Cardiac catheterization  2014  . Breast surgery Left January 11, 2011    left breast stereotactic biopsy: DCIS  . Appendectomy      SOCIAL HISTORY:   History  Substance Use Topics  . Smoking status: Former Smoker -- 0.00 packs/day for 30 years  . Smokeless tobacco: Never Used  . Alcohol Use: No    FAMILY HISTORY:   Family History  Problem Relation Age of Onset  . Breast cancer Daughter   . Breast cancer Mother   . Diabetes Mother   . Hypertension Mother   . Hypertension Sister     DRUG ALLERGIES:   Allergies  Allergen Reactions  . Hydrocodone-Acetaminophen Rash  . Macrodantin [Nitrofurantoin] Rash    REVIEW OF SYSTEMS:   Review of Systems  Constitutional: Negative for fever, chills, weight loss and malaise/fatigue.  HENT: Negative for congestion and hearing loss.   Eyes: Negative for blurred vision and pain.  Respiratory: Positive for cough, sputum production, shortness of breath and wheezing. Negative for hemoptysis and stridor.   Cardiovascular: Positive  for orthopnea and leg swelling. Negative for chest pain and palpitations.  Gastrointestinal: Positive for nausea. Negative for vomiting, abdominal pain, diarrhea, constipation and blood in stool.  Genitourinary: Negative for dysuria and frequency.  Musculoskeletal: Negative for myalgias, back pain, joint pain and neck pain.  Skin: Negative for rash.  Neurological: Positive for weakness. Negative for focal weakness, loss of consciousness and headaches.  Endo/Heme/Allergies: Does not  bruise/bleed easily.  Psychiatric/Behavioral: Negative for depression and hallucinations. The patient is not nervous/anxious.     MEDICATIONS AT HOME:   Prior to Admission medications   Medication Sig Start Date End Date Taking? Authorizing Provider  ADVAIR DISKUS 250-50 MCG/DOSE AEPB Inhale 1 puff into the lungs 2 (two) times daily.  03/05/14   Historical Provider, MD  albuterol (PROVENTIL HFA;VENTOLIN HFA) 108 (90 BASE) MCG/ACT inhaler Inhale into the lungs every 6 (six) hours as needed for wheezing or shortness of breath.    Historical Provider, MD  ALPRAZolam Duanne Moron) 0.25 MG tablet Take 0.25 mg by mouth at bedtime as needed.  02/26/14   Historical Provider, MD  calcium carbonate (OS-CAL) 600 MG TABS tablet Take 600 mg by mouth 2 (two) times daily with a meal.    Historical Provider, MD  chlorpheniramine-HYDROcodone (TUSSIONEX PENNKINETIC ER) 10-8 MG/5ML SUER Take 5 mLs by mouth 2 (two) times daily. 08/17/14   Earleen Newport, MD  Cholecalciferol 1000 UNITS TBDP Take by mouth daily.    Historical Provider, MD  hydrALAZINE (APRESOLINE) 50 MG tablet Take 1 tablet (50 mg total) by mouth 2 (two) times daily. 08/29/14   Vishwanath Hande, MD  insulin regular (NOVOLIN R,HUMULIN R) 100 units/mL injection Inject into the skin as needed for high blood sugar.    Historical Provider, MD  ipratropium-albuterol (DUONEB) 0.5-2.5 (3) MG/3ML SOLN  12/19/13   Historical Provider, MD  LANTUS SOLOSTAR 100 UNIT/ML SOPN Inject 18 Units into the skin 2 (two) times daily. 07/13/12   Historical Provider, MD  levofloxacin (LEVAQUIN) 250 MG tablet Take 1 tablet (250 mg total) by mouth daily. 08/29/14   Tracie Harrier, MD  levothyroxine (SYNTHROID, LEVOTHROID) 100 MCG tablet Take 100 mcg by mouth daily before breakfast.  03/08/14   Historical Provider, MD  levothyroxine (SYNTHROID, LEVOTHROID) 88 MCG tablet Take 88 mcg by mouth daily before breakfast.  03/08/14   Historical Provider, MD  losartan (COZAAR) 100 MG tablet Take  1 tablet by mouth daily. 07/07/12   Historical Provider, MD  lovastatin (MEVACOR) 20 MG tablet Take 1 tablet by mouth daily. 07/04/12   Historical Provider, MD  oxyCODONE-acetaminophen (PERCOCET/ROXICET) 5-325 MG per tablet Take 1 tablet by mouth every 6 (six) hours as needed (chest pain). 08/29/14   Vishwanath Hande, MD  predniSONE (DELTASONE) 10 MG tablet Prednisone 10 mg tabs  Take 4 tabs po once daily for 3 days Take 3 tabs po once daily for 3 days Take 2 tabs po once daily for 3 days Take 1 tab po once daily for 3 days And then stop  Total: 30 tablets 08/29/14   Vishwanath Hande, MD  tiotropium (SPIRIVA) 18 MCG inhalation capsule Place 18 mcg into inhaler and inhale daily.    Historical Provider, MD      VITAL SIGNS:  Blood pressure 152/57, pulse 85, temperature 98.9 F (37.2 C), temperature source Oral, resp. rate 20, height 5' (1.524 m), weight 77.111 kg (170 lb), SpO2 92 %.  PHYSICAL EXAMINATION:  GENERAL:  74 y.o.-year-old patient lying in the bed with no acute distress.  EYES: Pupils  equal, round, reactive to light and accommodation. No scleral icterus. Extraocular muscles intact.  HEENT: Head atraumatic, normocephalic. Oropharynx and nasopharynx clear. Mucous membranes are moist, dentures in place NECK:  Supple, no jugular venous distention. No thyroid enlargement, no tenderness.  LUNGS: Bibasilar crackles, fine wheezes in the left base, no accessory muscle use, no respiratory distress CARDIOVASCULAR: Distant S1, S2 normal. No murmurs, rubs, or gallops.  ABDOMEN: Soft, nontender, nondistended. Bowel sounds present. No organomegaly or mass. Guarding no rebound EXTREMITIES: 2+ upper and lower extremity edema, no  cyanosis, or clubbing.  NEUROLOGIC: Cranial nerves II through XII are intact. Muscle strength 5/5 in all extremities. Sensation intact. Gait not checked.  PSYCHIATRIC: The patient is alert and oriented x 3.  SKIN: No obvious rash, lesion, or ulcer.   LABORATORY PANEL:    CBC  Recent Labs Lab 09/15/14 1744  WBC 7.6  HGB 9.9*  HCT 30.3*  PLT 154   ------------------------------------------------------------------------------------------------------------------  Chemistries   Recent Labs Lab 09/15/14 1744  NA 132*  K 4.3  CL 95*  CO2 28  GLUCOSE 221*  BUN 79*  CREATININE 2.29*  CALCIUM 7.8*  AST 20  ALT 26  ALKPHOS 69  BILITOT 0.3   ------------------------------------------------------------------------------------------------------------------  Cardiac Enzymes  Recent Labs Lab 09/15/14 1744  TROPONINI <0.03   ------------------------------------------------------------------------------------------------------------------  RADIOLOGY:  Dg Chest Portable 1 View  09/15/2014   CLINICAL DATA:  Shortness of breath, productive cough  EXAM: PORTABLE CHEST - 1 VIEW  COMPARISON:  CT chest dated 08/28/2014  FINDINGS: Patchy lingular/left lower lobe opacity, some of which is likely chronic. Superimposed infection is not excluded.  Small left pleural effusion.  No pneumothorax.  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  Cardiomegaly.  IMPRESSION: Patchy lingular/ left lower lobe opacity, some of which is likely chronic, superimposed infection not excluded.  Small left pleural effusion.  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   Electronically Signed   By: Julian Hy M.D.   On: 09/15/2014 17:53    EKG:   Orders placed or performed during the hospital encounter of 09/15/14  . ED EKG  . ED EKG    IMPRESSION AND PLAN:   Principal Problem:   Acute on chronic respiratory failure  Problem #1 acute on chronic respiratory failure: In combination with the anasarca and weight gain I am concerned about congestive heart failure. I do not see a 2-D echocardiogram in the  record. Will order for tomorrow. She has received 40 mg of Lasix in the emergency room and responded well to diuresis. Will start daily weights, I's and O's, low-sodium diet, 40 mg of Lasix twice a day. Additionally on her last admission she had a lingular pneumonia and cultures grew stenotrophomonas. At that time was recommended if she did not respond well to Levaquin that she have bronchoscopy. She has no leukocytosis or fever, symptoms are not consistent with pneumonia. It is possible that the x-ray findings in the lingula are chronic. We'll obtain blood and sputum cultures but hold off for now on empiric anti-biotics. Have ordered repeat chest x-ray for tomorrow to reevaluate after diuresis.  Problem #2 chronic kidney disease stage IV: Monitor carefully with diuresis. Her creatinine is slightly elevated from prior values. Her baseline creatinine seems to be about 2 GFR of 22. Consider Lasix infusion if creatinine bumps tomorrow morning.  Problem #3  COPD with chronic respiratory failure: Continue with Advair, Spiriva with albuterol as needed. Does not seem to be having an exacerbation at this time.  #4 hypertension: Blood pressure slightly elevated on presentation this should improve with diuresis. Also continue hydralazine and losartan.  Prophylaxis heparin for DVT prophylaxis, no GI prophylaxis at this time    All the records are reviewed and case discussed with ED provider. Management plans discussed with the patient, family and they are in agreement.  CODE STATUS: DO NOT RESUSCITATE, this was discussed with the patient and her power of attorney on admission  TOTAL TIME TAKING CARE OF THIS PATIENT: 55 minutes.  Greater than 50% of time spent in coordination of care and counseling.  Myrtis Ser M.D on 09/15/2014 at 8:37 PM  Between 7am to 6pm - Pager - [phone]  After 6pm go to [email or web address] - password [email or web address] [facility]  Office   [phone]  CC: Primary care physician; Tracie Harrier, MD

## 2014-09-15 NOTE — ED Notes (Signed)
Attempted to get IV access, unsuccessful, attempts 2, pt tolerated well. Primary RN made aware.

## 2014-09-16 ENCOUNTER — Inpatient Hospital Stay
Admit: 2014-09-16 | Discharge: 2014-09-16 | Disposition: A | Discharge status: Home / Self Care | Payer: Commercial Managed Care - HMO | Attending: Internal Medicine | Admitting: Internal Medicine

## 2014-09-16 ENCOUNTER — Inpatient Hospital Stay: Payer: Commercial Managed Care - HMO

## 2014-09-16 LAB — BASIC METABOLIC PANEL
Anion gap: 9 (ref 5–15)
BUN: 87 mg/dL — ABNORMAL HIGH (ref 6–20)
CO2: 30 mmol/L (ref 22–32)
Calcium: 8.1 mg/dL — ABNORMAL LOW (ref 8.9–10.3)
Chloride: 95 mmol/L — ABNORMAL LOW (ref 101–111)
Creatinine, Ser: 2.48 mg/dL — ABNORMAL HIGH (ref 0.44–1.00)
GFR calc non Af Amer: 18 mL/min — ABNORMAL LOW (ref >60)
GFR, EST AFRICAN AMERICAN: 21 mL/min — AB (ref >60)
Glucose, Bld: 297 mg/dL — ABNORMAL HIGH (ref 65–99)
Potassium: 4 mmol/L (ref 3.5–5.1)
Sodium: 134 mmol/L — ABNORMAL LOW (ref 135–145)

## 2014-09-16 LAB — CBC
HCT: 26.8 % — ABNORMAL LOW (ref 35.0–47.0)
HEMOGLOBIN: 8.9 g/dL — AB (ref 12.0–16.0)
MCH: 27.2 pg (ref 26.0–34.0)
MCHC: 33.2 g/dL (ref 32.0–36.0)
MCV: 82 fL (ref 80.0–100.0)
Platelets: 148 10*3/uL — ABNORMAL LOW (ref 150–400)
RBC: 3.27 MIL/uL — ABNORMAL LOW (ref 3.80–5.20)
RDW: 14.7 % — ABNORMAL HIGH (ref 11.5–14.5)
WBC: 7.4 10*3/uL (ref 3.6–11.0)

## 2014-09-16 LAB — GLUCOSE, CAPILLARY
GLUCOSE-CAPILLARY: 249 mg/dL — AB (ref 65–99)
GLUCOSE-CAPILLARY: 271 mg/dL — AB (ref 65–99)
Glucose-Capillary: 282 mg/dL — ABNORMAL HIGH (ref 65–99)

## 2014-09-16 LAB — HEMOGLOBIN A1C: Hgb A1c MFr Bld: 7.3 % — ABNORMAL HIGH (ref 4.0–6.0)

## 2014-09-16 MED ORDER — FUROSEMIDE 10 MG/ML IJ SOLN: 40.0000 mg | Freq: Every day | INTRAMUSCULAR | Status: DC

## 2014-09-16 MED ORDER — FUROSEMIDE 10 MG/ML IJ SOLN
40.0000 mg | Freq: Every day | INTRAMUSCULAR | Status: DC
  Administered 2014-09-17: 40 mg via INTRAVENOUS
  Filled 2014-09-16: qty 4

## 2014-09-16 NOTE — Progress Notes (Signed)
   09/16/14 1300  Clinical Encounter Type  Visited With Patient and family together  Visit Type Follow-up  Referral From Chaplain  Consult/Referral To Chaplain  Follow up visit with patient and family.  Family was able to have Advance Directive completed late last night and put into patient's record.  Copy given to patient.  Patient and family thanked me for following up with them.  [place] [phone]

## 2014-09-16 NOTE — Plan of Care (Signed)
Problem: Discharge Progression Outcomes Goal: Other Discharge Outcomes/Goals Outcome: Progressing Plan of care progress to goals: 1.Plan is for patient to d/c back to [facility] at discharge, up to [facility] [place] with 1 assist.  2.Patient denies pain. 3.BUN 79 and creatinine 2.29, BNP 558, WBC's 7.6, Hgb 9.9, VSS 4.No c/o nausea/vomiting, good appetite.  5.Patient and family express concern that pt continues to have PNA ongoing for 3 weeks now, with little improvement. Currently patient is requiring 3LPM oxygen, sats 91%

## 2014-09-16 NOTE — Progress Notes (Signed)
Inpatient Diabetes Program Recommendations  AACE/ADA: New Consensus Statement on Inpatient Glycemic Control (2013)  Target Ranges:  Prepandial:   less than 140 mg/dL      Peak postprandial:   less than 180 mg/dL (1-2 hours)      Critically ill patients:  140 - 180 mg/dL  Results for Whitney Wright, Whitney Wright (MRN [id number]) as of 09/16/2014 13:09  Ref. Range 09/15/2014 21:15 09/15/2014 23:48 09/16/2014 07:24 09/16/2014 11:11  Glucose-Capillary Latest Ref Range: 65-99 mg/dL 266 (H) 384 (H) 249 (H) 282 (H)   Diabetes history: Type 2 diabetes Outpatient Diabetes medications: NPH and regular insulin (doses not specified on medication reconciliation) Current orders for Inpatient glycemic control:  Lantus 18 units daily, Novolog sensitive tid with meals.  May consider increasing Lantus to 24 units q HS, and adding Novolog 4 units tid with meals (hold if patient eats less than 50%).  Thanks, Adah Perl, RN, BC-ADM Inpatient Diabetes Coordinator Pager [phone] (8a-5p)

## 2014-09-16 NOTE — Progress Notes (Signed)
*  PRELIMINARY RESULTS* Echocardiogram 2D Echocardiogram has been performed.  [name] 09/16/2014, 8:27 AM

## 2014-09-16 NOTE — Clinical Social Work Note (Signed)
Clinical Social Worker received consult as pt was admitted from [facility]. CSW attempted assessment, however pt was sleeping soundly. CSW will reattempt later. FL2 is completed and on the chart for MD's signature. CSW will continue to follow.   Darden Dates, MSW, LCSW Clinical Social Worker  [phone]

## 2014-09-16 NOTE — Plan of Care (Signed)
Problem: Discharge Progression Outcomes Goal: Other Discharge Outcomes/Goals Outcome: Progressing Plan of care progress to goal:  No complaints of pain. Up to [facility] with one person assist. Patient remains on 3L-O2; dyspnea on exertion. Family at bedside.

## 2014-09-16 NOTE — Progress Notes (Signed)
Weaned patient to 2L-O2. Patient sat 95% on 3L-O2. Instructed patient to notify staff if feeling short of breath.   Patient on chronic oxygen-2L at home. Madlyn Frankel, RN

## 2014-09-16 NOTE — Progress Notes (Signed)
PROGRESS NOTE  Whitney Wright [id number]:[id number] DOB: 04-03-1940 DOA: 09/15/2014 PCP: Whitney Harrier, MD  Subjective: 74 y/o f with hx of COPD, Chronic Respiratory Failure, Ca Lung, Type 2 DM, HTN , recently discharged from [facility]- readmitted with increasing shortness of breath and Hypoxemia. Pt also reports 14 lb weight gain    Consultants:    Objective: BP 169/63 mmHg  Pulse 85  Temp(Src) 98 F (36.7 C) (Oral)  Resp 17  Ht 5' (1.524 m)  Wt 79.516 kg (175 lb 4.8 oz)  BMI 34.24 kg/m2  SpO2 93%  Intake/Output Summary (Last 24 hours) at 09/16/14 1225 Last data filed at 09/16/14 0930  Gross per 24 hour  Intake    240 ml  Output   1120 ml  Net   -880 ml   Filed Weights   09/15/14 1701 09/15/14 2104 09/16/14 0424  Weight: 77.111 kg (170 lb) 77.701 kg (171 lb 4.8 oz) 79.516 kg (175 lb 4.8 oz)    Exam:  HEENT: [place], AT  General:  NAD  Cardiovascular: S1 S2   Respiratory: Rhonchi + Bilat  Abdomen: Soft . Non tender  Neuro:Non Fical   Data Reviewed: Basic Metabolic Panel:  Recent Labs Lab 09/10/14 0818 09/15/14 1744 09/16/14 0440  NA 140 132* 134*  K 4.7 4.3 4.0  CL 103 95* 95*  CO2 '27 28 30  '$ GLUCOSE 86 221* 297*  BUN 60* 79* 87*  CREATININE 2.05* 2.29* 2.48*  CALCIUM 7.9* 7.8* 8.1*   Liver Function Tests:  Recent Labs Lab 09/10/14 0818 09/15/14 1744  AST 21 20  ALT 29 26  ALKPHOS 55 69  BILITOT 0.4 0.3  PROT 4.6* 5.5*  ALBUMIN 2.2* 2.1*   No results for input(s): LIPASE, AMYLASE in the last 168 hours. No results for input(s): AMMONIA in the last 168 hours. CBC:  Recent Labs Lab 09/10/14 0818 09/15/14 1744 09/16/14 0440  WBC 10.5 7.6 7.4  NEUTROABS 9.1*  --   --   HGB 10.7* 9.9* 8.9*  HCT 32.3* 30.3* 26.8*  MCV 84.9 83.8 82.0  PLT 113* 154 148*   Cardiac Enzymes:    Recent Labs Lab 09/15/14 1744  TROPONINI <0.03   BNP (last 3 results)  Recent Labs  08/17/14 1006 09/15/14 1744  BNP 390.0* 558.0*     ProBNP (last 3 results) No results for input(s): PROBNP in the last 8760 hours.  CBG:  Recent Labs Lab 09/15/14 2115 09/15/14 2348 09/16/14 0724 09/16/14 1111  GLUCAP 266* 384* 249* 282*    Recent Results (from the past 240 hour(s))  Culture, blood (routine x 2)     Status: None (Preliminary result)   Collection Time: 09/15/14  5:44 PM  Result Value Ref Range Status   Specimen Description BLOOD RIGHT ASSIST CONTROL  Final   Special Requests   Final    BOTTLES DRAWN AEROBIC AND ANAEROBIC  AER 6CC ANA 8CC   Culture NO GROWTH < 12 HOURS  Final   Report Status PENDING  Incomplete  Culture, blood (routine x 2)     Status: None (Preliminary result)   Collection Time: 09/15/14  5:47 PM  Result Value Ref Range Status   Specimen Description BLOOD RIGHT FATTY CASTS  Final   Special Requests   Final    BOTTLES DRAWN AEROBIC AND ANAEROBIC  AER 5CC ANA [place]   Culture NO GROWTH < 12 HOURS  Final   Report Status PENDING  Incomplete     Studies: X-ray Chest Pa And  Lateral  09/16/2014   CLINICAL DATA:  New admission for pneumonia. Chronic obstructive lung disease. History of left upper lobe lung cancer.  EXAM: CHEST  2 VIEW  COMPARISON:  09/15/2014 and previous  FINDINGS: Background pattern of chronic emphysema. There is more pleural density at the left base than was seen in June. There is patchy radiographic density at the left base consistent with chronic volume loss and pneumonia. No focal finding evident on the right other than emphysema. 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  IMPRESSION: Similar appearance to the study of yesterday. Left effusion and left lower lobe infiltrate since with chronic and acute pneumonia.  Emphysema.  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   Electronically Signed   By: Nelson Chimes M.D.   On: 09/16/2014 07:35   Dg Chest Portable 1 View  09/15/2014    CLINICAL DATA:  Shortness of breath, productive cough  EXAM: PORTABLE CHEST - 1 VIEW  COMPARISON:  CT chest dated 08/28/2014  FINDINGS: Patchy lingular/left lower lobe opacity, some of which is likely chronic. Superimposed infection is not excluded.  Small left pleural effusion.  No pneumothorax.  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  Cardiomegaly.  IMPRESSION: Patchy lingular/ left lower lobe opacity, some of which is likely chronic, superimposed infection not excluded.  Small left pleural effusion.  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   Electronically Signed   By: Julian Hy M.D.   On: 09/15/2014 17:53    Scheduled Meds: . aspirin EC  81 mg Oral Daily  . furosemide  40 mg Intravenous Q12H  . heparin  5,000 Units Subcutaneous 3 times per day  . hydrALAZINE  50 mg Oral BID  . insulin aspart  0-5 Units Subcutaneous QHS  . insulin aspart  0-9 Units Subcutaneous TID WC  . insulin glargine  18 Units Subcutaneous QHS  . levothyroxine  100 mcg Oral QAC breakfast  . levothyroxine  88 mcg Oral QAC breakfast  . losartan  100 mg Oral Daily  . mometasone-formoterol  2 puff Inhalation BID  . tiotropium  18 mcg Inhalation Daily  . verapamil  240 mg Oral Daily    Continuous Infusions:   Assessment/Plan: 1  Acute on chronic respiratory failure secondary to Volume overload: Improved with IV lasix. ECHO done this am. Pt states she is feeling better  Decrease dose to 40 mg iv q d 2 COPD: Continue SVN's 3 HTN; On Losartan and Hydralazine- Continue to monitor 4 Type 2 DM: On  Lantus insulin and SSI 5 CKD: Se Creat is 2.48- Monitor closely   Code Status: DNR        Whitney Wright   09/16/2014, 12:25 PM  LOS: 1 day

## 2014-09-17 DIAGNOSIS — E119 Type 2 diabetes mellitus without complications: Secondary | ICD-10-CM

## 2014-09-17 DIAGNOSIS — M129 Arthropathy, unspecified: Secondary | ICD-10-CM

## 2014-09-17 DIAGNOSIS — I129 Hypertensive chronic kidney disease with stage 1 through stage 4 chronic kidney disease, or unspecified chronic kidney disease: Secondary | ICD-10-CM

## 2014-09-17 DIAGNOSIS — I10 Essential (primary) hypertension: Secondary | ICD-10-CM

## 2014-09-17 DIAGNOSIS — C3490 Malignant neoplasm of unspecified part of unspecified bronchus or lung: Secondary | ICD-10-CM | POA: Insufficient documentation

## 2014-09-17 DIAGNOSIS — R234 Changes in skin texture: Secondary | ICD-10-CM

## 2014-09-17 DIAGNOSIS — C3492 Malignant neoplasm of unspecified part of left bronchus or lung: Secondary | ICD-10-CM

## 2014-09-17 DIAGNOSIS — N189 Chronic kidney disease, unspecified: Secondary | ICD-10-CM | POA: Diagnosis not present

## 2014-09-17 DIAGNOSIS — Z90710 Acquired absence of both cervix and uterus: Secondary | ICD-10-CM

## 2014-09-17 DIAGNOSIS — Z79899 Other long term (current) drug therapy: Secondary | ICD-10-CM

## 2014-09-17 DIAGNOSIS — R5383 Other fatigue: Secondary | ICD-10-CM

## 2014-09-17 DIAGNOSIS — Z853 Personal history of malignant neoplasm of breast: Secondary | ICD-10-CM

## 2014-09-17 DIAGNOSIS — R0602 Shortness of breath: Secondary | ICD-10-CM

## 2014-09-17 DIAGNOSIS — M810 Age-related osteoporosis without current pathological fracture: Secondary | ICD-10-CM

## 2014-09-17 DIAGNOSIS — Z923 Personal history of irradiation: Secondary | ICD-10-CM

## 2014-09-17 DIAGNOSIS — Z8701 Personal history of pneumonia (recurrent): Secondary | ICD-10-CM

## 2014-09-17 DIAGNOSIS — E039 Hypothyroidism, unspecified: Secondary | ICD-10-CM

## 2014-09-17 DIAGNOSIS — Z794 Long term (current) use of insulin: Secondary | ICD-10-CM

## 2014-09-17 DIAGNOSIS — I2510 Atherosclerotic heart disease of native coronary artery without angina pectoris: Secondary | ICD-10-CM

## 2014-09-17 DIAGNOSIS — R079 Chest pain, unspecified: Secondary | ICD-10-CM

## 2014-09-17 DIAGNOSIS — Z7982 Long term (current) use of aspirin: Secondary | ICD-10-CM

## 2014-09-17 DIAGNOSIS — J449 Chronic obstructive pulmonary disease, unspecified: Secondary | ICD-10-CM

## 2014-09-17 LAB — CBC WITH DIFFERENTIAL/PLATELET
BASOS ABS: 0 10*3/uL (ref 0–0.1)
Basophils Relative: 0 %
EOS PCT: 0 %
Eosinophils Absolute: 0 10*3/uL (ref 0–0.7)
HCT: 27.2 % — ABNORMAL LOW (ref 35.0–47.0)
HEMOGLOBIN: 8.8 g/dL — AB (ref 12.0–16.0)
Lymphocytes Relative: 7 %
Lymphs Abs: 0.6 10*3/uL — ABNORMAL LOW (ref 1.0–3.6)
MCH: 27 pg (ref 26.0–34.0)
MCHC: 32.4 g/dL (ref 32.0–36.0)
MCV: 83.5 fL (ref 80.0–100.0)
MONOS PCT: 8 %
Monocytes Absolute: 0.7 10*3/uL (ref 0.2–0.9)
Neutro Abs: 7.6 10*3/uL — ABNORMAL HIGH (ref 1.4–6.5)
Neutrophils Relative %: 85 %
Platelets: 183 10*3/uL (ref 150–440)
RBC: 3.26 MIL/uL — ABNORMAL LOW (ref 3.80–5.20)
RDW: 14.8 % — ABNORMAL HIGH (ref 11.5–14.5)
WBC: 9 10*3/uL (ref 3.6–11.0)

## 2014-09-17 LAB — COMPREHENSIVE METABOLIC PANEL
ALT: 23 U/L (ref 14–54)
AST: 18 U/L (ref 15–41)
Albumin: 2 g/dL — ABNORMAL LOW (ref 3.5–5.0)
Alkaline Phosphatase: 62 U/L (ref 38–126)
Anion gap: 13 (ref 5–15)
BUN: 87 mg/dL — AB (ref 6–20)
CO2: 29 mmol/L (ref 22–32)
Calcium: 8 mg/dL — ABNORMAL LOW (ref 8.9–10.3)
Chloride: 95 mmol/L — ABNORMAL LOW (ref 101–111)
Creatinine, Ser: 2.36 mg/dL — ABNORMAL HIGH (ref 0.44–1.00)
GFR calc non Af Amer: 19 mL/min — ABNORMAL LOW (ref >60)
GFR, EST AFRICAN AMERICAN: 22 mL/min — AB (ref >60)
Glucose, Bld: 287 mg/dL — ABNORMAL HIGH (ref 65–99)
Potassium: 3.7 mmol/L (ref 3.5–5.1)
SODIUM: 137 mmol/L (ref 135–145)
Total Bilirubin: 0.3 mg/dL (ref 0.3–1.2)
Total Protein: 4.9 g/dL — ABNORMAL LOW (ref 6.5–8.1)

## 2014-09-17 LAB — GLUCOSE, CAPILLARY
GLUCOSE-CAPILLARY: 177 mg/dL — AB (ref 65–99)
GLUCOSE-CAPILLARY: 145 mg/dL — AB (ref 65–99)
Glucose-Capillary: 154 mg/dL — ABNORMAL HIGH (ref 65–99)
Glucose-Capillary: 227 mg/dL — ABNORMAL HIGH (ref 65–99)

## 2014-09-17 LAB — TSH: TSH: 1.845 u[IU]/mL (ref 0.350–4.500)

## 2014-09-17 MED ORDER — FUROSEMIDE 20 MG PO TABS
40.0000 mg | ORAL_TABLET | Freq: Every day | ORAL | Status: DC
  Administered 2014-09-18 – 2014-09-21 (×4): 40 mg via ORAL
  Filled 2014-09-17 (×4): qty 2

## 2014-09-17 MED ORDER — TAMOXIFEN CITRATE 10 MG PO TABS
20.0000 mg | ORAL_TABLET | Freq: Every day | ORAL | Status: DC
  Administered 2014-09-17 – 2014-09-21 (×5): 20 mg via ORAL
  Filled 2014-09-17 (×5): qty 2

## 2014-09-17 NOTE — Plan of Care (Signed)
Problem: Discharge Progression Outcomes Goal: Other Discharge Outcomes/Goals Outcome: Progressing Patient is alert and oriented, no c/o pain. 2 L [place] on oxygen. Foley in place, resting quietly.

## 2014-09-17 NOTE — Progress Notes (Signed)
Patient family wanted to complete AD. No notary available. Late Sunday evening. Percival Spanish, Chaplain [phone]

## 2014-09-17 NOTE — Plan of Care (Signed)
Problem: Discharge Progression Outcomes Goal: Other Discharge Outcomes/Goals Outcome: Progressing Plan of care Progress to Goal:  Pt still having bilateral UE and LE edema although she states it's less. Improving w/lasix.  Will be started on PO lasix tomorrow.   CXR shows L pleural effusion and LLL infiltrate.  She's c/o of LLL pain - same as last admission.  Pt to have oncology consult and work w/PT before d/c possible back to [place].

## 2014-09-17 NOTE — Progress Notes (Signed)
PROGRESS NOTE  Whitney Wright KDX:[id number] DOB: 1940-07-20 DOA: 09/15/2014 PCP: Tracie Harrier, MD  Subjective: 74 y/o f with hx of COPD, Chronic Respiratory Failure, Ca Lung, Type 2 DM, HTN , recently discharged from [facility]- readmitted with increasing shortness of breath and Hypoxemia. Pt also reports 14 lb weight gain  ECHo: EF 55 to 60%   Consultants:    Objective: BP 162/77 mmHg  Pulse 94  Temp(Src) 98.3 F (36.8 C) (Oral)  Resp 18  Ht 5' (1.524 m)  Wt 81.194 kg (179 lb)  BMI 34.96 kg/m2  SpO2 95%  Intake/Output Summary (Last 24 hours) at 09/17/14 1327 Last data filed at 09/17/14 0900  Gross per 24 hour  Intake    480 ml  Output    250 ml  Net    230 ml   Filed Weights   09/15/14 2104 09/16/14 0424 09/17/14 0500  Weight: 77.701 kg (171 lb 4.8 oz) 79.516 kg (175 lb 4.8 oz) 81.194 kg (179 lb)    Exam:  HEENT: [place], AT  General:  NAD  Cardiovascular: S1 S2   Respiratory: Rhonchi + Bilat  Abdomen: Soft . Non tender  Neuro:Non Fical   Data Reviewed: Basic Metabolic Panel:  Recent Labs Lab 09/15/14 1744 09/16/14 0440 09/17/14 0429  NA 132* 134* 137  K 4.3 4.0 3.7  CL 95* 95* 95*  CO2 '28 30 29  '$ GLUCOSE 221* 297* 287*  BUN 79* 87* 87*  CREATININE 2.29* 2.48* 2.36*  CALCIUM 7.8* 8.1* 8.0*   Liver Function Tests:  Recent Labs Lab 09/15/14 1744 09/17/14 0429  AST 20 18  ALT 26 23  ALKPHOS 69 62  BILITOT 0.3 0.3  PROT 5.5* 4.9*  ALBUMIN 2.1* 2.0*   No results for input(s): LIPASE, AMYLASE in the last 168 hours. No results for input(s): AMMONIA in the last 168 hours. CBC:  Recent Labs Lab 09/15/14 1744 09/16/14 0440 09/17/14 0429  WBC 7.6 7.4 9.0  NEUTROABS  --   --  7.6*  HGB 9.9* 8.9* 8.8*  HCT 30.3* 26.8* 27.2*  MCV 83.8 82.0 83.5  PLT 154 148* 183   Cardiac Enzymes:    Recent Labs Lab 09/15/14 1744  TROPONINI <0.03   BNP (last 3 results)  Recent Labs  08/17/14 1006 09/15/14 1744  BNP 390.0*  558.0*    ProBNP (last 3 results) No results for input(s): PROBNP in the last 8760 hours.  CBG:  Recent Labs Lab 09/16/14 0724 09/16/14 1111 09/16/14 1606 09/17/14 0742 09/17/14 1108  GLUCAP 249* 282* 271* 154* 177*    Recent Results (from the past 240 hour(s))  Culture, blood (routine x 2)     Status: None (Preliminary result)   Collection Time: 09/15/14  5:44 PM  Result Value Ref Range Status   Specimen Description BLOOD RIGHT ASSIST CONTROL  Final   Special Requests   Final    BOTTLES DRAWN AEROBIC AND ANAEROBIC  AER 6CC ANA 8CC   Culture NO GROWTH 2 DAYS  Final   Report Status PENDING  Incomplete  Culture, blood (routine x 2)     Status: None (Preliminary result)   Collection Time: 09/15/14  5:47 PM  Result Value Ref Range Status   Specimen Description BLOOD RIGHT FATTY CASTS  Final   Special Requests   Final    BOTTLES DRAWN AEROBIC AND ANAEROBIC  AER 5CC ANA [place]   Culture NO GROWTH 2 DAYS  Final   Report Status PENDING  Incomplete  Studies: No results found.  Scheduled Meds: . aspirin EC  81 mg Oral Daily  . furosemide  40 mg Intravenous Daily  . heparin  5,000 Units Subcutaneous 3 times per day  . hydrALAZINE  50 mg Oral BID  . insulin aspart  0-5 Units Subcutaneous QHS  . insulin aspart  0-9 Units Subcutaneous TID WC  . insulin glargine  18 Units Subcutaneous QHS  . levothyroxine  100 mcg Oral QAC breakfast  . levothyroxine  88 mcg Oral QAC breakfast  . losartan  100 mg Oral Daily  . mometasone-formoterol  2 puff Inhalation BID  . tamoxifen  20 mg Oral Daily  . tiotropium  18 mcg Inhalation Daily  . verapamil  240 mg Oral Daily    Continuous Infusions:   Assessment/Plan: 1  Acute on chronic respiratory failure secondary to Volume overload: Improved with IV lasix. ECHO Normal LV Function CXR: Left Pleural effusion and left lower lobe infiltrate  Pt states she is feeling better  Change to po lasix starting in am Will discuss with Oncology 2  COPD: Continue SVN's 3 HTN; On Losartan and Hydralazine- Continue to monitor 4 Type 2 DM: On  Lantus insulin and SSI 5 CKD: Se Creat is 2.36- Continue to monitor  6 Physical Therapy   Code Status: DNR        [name]   09/17/2014, 1:27 PM  LOS: 2 days

## 2014-09-17 NOTE — Consult Note (Signed)
ONCOLOGY CONSULTATION NOTE -   Reason for Consultation - history of recurrent left lower non-small cell lung cancer, persistent chest pain, dyspnea.  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 Patient has poor performance status and has been on observation/surveillance. She has had repeated hospitalization for COPD exacerbation, pneumonia, weakness. Currently she has been again admitted to the hospital with progressive shortness of breath. Patient states that she continues to have left lower chest pain. Noncontrast CT scan of the chest continues to show chronic left lower lung changes. Patient is anxious as to whether lung cancer has recurred. She denies any hemoptysis. Currently no fevers. Eating fair.  PAST MEDICAL HISTORY:   Past Medical History  Diagnosis Date  . Hypertension   . Cellulitis and abscess of trunk   . Thyroid disease     hypo  . Arthritis   . Personal history of malignant neoplasm of breast   . Diabetes mellitus without complication   . Senile osteoporosis   . 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 ER 50%, PR 0, HER-2/neu nonamplified.. Tamoxifen chosen as bone density showed severe osteoporosis.  . Lung cancer, upper lobe February 2015    Adenocarcinoma with lepidic pattern, second primary fall 2015  . Breast cancer   . COPD (chronic obstructive pulmonary disease)   . Shortness of breath dyspnea   . Chronic kidney disease     PAST SURGICAL HISTORY:   Past Surgical History  Procedure Laterality Date  . Abdominal hysterectomy  age 74 . Mammosite balloon  placement Left 2012  . Cardiac catheterization  2014  . Breast surgery Left January 11, 2011    left breast stereotactic biopsy: DCIS  . Appendectomy         Allergies:  Macrodantin: Rash, Norco   Smoking History:  Smoking History- Quit March 2012(1)  PFSH: Family History: noncontributory  Social History: noncontributory   Home Medications: Medication Instructions Last Modified Date/Time  aspirin 81 mg oral tablet 1 tab(s) orally once a day (in the morning)   27-Apr-16 15:32  hydrochlorothiazide 25 mg oral tablet 1 tab(s) orally once a day does not take it every day 27-Apr-16 15:32  levothyroxine 175 mcg (0.175 mg) oral tablet 1 tab(s) orally once a day 27-Apr-16 15:32  hydrALAZINE 25 mg oral tablet 2 tab(s) orally 2 times a day 27-Apr-16 15:32  alprazolam 0.25 mg oral tablet 0.5 tab(s) orally once a day, As Needed 27-Apr-16 15:32  verapamil 120 mg/24 hours oral capsule, extended release 2 cap(s) orally once a day (at bedtime) 27-Apr-16 15:32  lovastatin 20 mg oral tablet 1 tab(s) orally once a day (at bedtime) 27-Apr-16 15:32  cholecalciferol 1000 intl units oral tablet 1 tab(s) orally once a day 27-Apr-16 15:32  Vitamin D3 2000 intl units oral tablet 1 tab(s) orally once a day (in the morning) 27-Apr-16 15:32  diclofenac sodium sodium 75 mg oral delayed release tablet 1 tab(s) orally 2 times a day 27-Apr-16 15:32  calcium carbonate 600 mg oral tablet 1 tab(s) orally 2 times a day 27-Apr-16 15:32  DuoNeb 2.5 mg-0.5 mg/3 mL inhalation solution 3 milliliter(s) inhaled 4 times a day 27-Apr-16 15:32  HumuLIN R human recombinant 500 units/mL subcutaneous solution  subcutaneous  sliding scale 27-Apr-16 15:32  Lantus Solostar Pen 100 units/mL subcutaneous solution 18 unit(s) subcutaneous 2 times a day 27-Apr-16 15:32  Advair Diskus 250 mcg-50 mcg powder 1 puff(s) inhaled 2 times a day  27-Apr-16 15:32  losartan 100 mg oral tablet 1 tab(s) orally once a day 27-Apr-16 15:32      Physical Exam:  VITALS: 98, 83, 20, 153/47, 94% on nasal cannula oxygen GENERAL: patient is chronically tired-looking, otherwise alert and oriented and in no acute distress. No icterus. HEENT: EOMs intact. No cervical lymphadenopathy. CVS: S1 and S2, regular LUNGS: Bilaterally diminished breath sounds overall. No rhonchi ABDOMEN: Soft, nontender. EXTREMITIES: No pedal edema. NEURO: Cranial nerves intact, moves all extremities spontaneously SKIN: Minor bruising. No rash   Laboratory Results: Cr 2.36, calcium 8.0, LFT unremarkable except albumen low at 2.0, WBC 9.0, hemoglobin 8.8, platelets 183, 85% neutrophils  Prior Studies -  01/15/14 - PET scan. IMPRESSION:  1. Progressive enlargement of previously described left lower lobe nodule which demonstrates low level hypermetabolic activity, suspicious for neoplasm such as a primary bronchogenic adenocarcinoma. 2. However, there are also evolving adjacent areas of peribronchovascular ground-glass attenuation and nodular airspace disease in the left lower lobe, and to a lesser extent in the medial segment of the low right middle lobe, which also demonstrate low-level hypermetabolic activity. These other areas are favored to be of infectious or inflammatory etiology rather than neoplastic, but continued attention on future followup studies is recommended.  3. 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  4. Areas of hypermetabolism in the region of the vulva near the introitus, and in the region of the anal canal. These findings could simply be related to urinary contamination, however, vulvar soft tissues appear thickened. The possibility of primary malignancies in these regions are not excluded, and correlation with physical examination is suggested.  5. Atherosclerosis, including left main and 3 vessel coronary artery disease. Assessment for  potential risk factor modification, dietary therapy or pharmacologic therapy may be warranted, if clinically indicated.  01/30/14 - Pathology Report. LUNG, LEFT LOWER LOBE; CT-GUIDED CORE BIOPSY: MUCINOUS ADENOCARCINOMA WITH LEPIDIC PATTERN.   COMMENT: In this sample the tumor is histologically similar to the previously biopsied mass in left upper lobe ([id number],03/26/13), which was reviewed. Tumors of this type have a strong tendency for multicentric, multilobar, and bilateral lung involvement. In additional, metastatic adenocarcinoma of non-pulmonary origin could have this histologic appearance, so clinical correlation is needed.  03/07/14 - Mammogram. IMPRESSION: No mammographic evidence of malignancy. RECOMMENDATION: Recommend annual diagnostic mammograms. BI-RADS CATEGORY  2: Benign.  06/24/14 - CT chest without contrast. IMPRESSION:  1. Left lower lobe nodule is stable and remains worrisome for primary bronchogenic carcinoma. 2. Resolved right middle lobe subpleural airspace consolidation, indicative of an infectious or inflammatory etiology. 3. Scattered peribronchovascular ground-glass densities in the right lung are new and likely infectious or inflammatory in etiology. 4. Post radiation changes in the lingula and left lower lobe. 5. Extensive 3 vessel coronary artery calcification. 6. Mixed attenuation extrapleural mass in the apex of the right hemi thorax, as on prior exams, indicative of a benign lesion such as a neurogenic tumor.   Impression/Recommendations - 74 year old female patient with history of multiple medical problems as described above, including has history of recurrent left lower lung cancer and history of breast cancer in the past, repeated hospitalization for respiratory issues currently again admitted with progressive dyspnea felt to  be acute on chronic respiratory failure along with volume overload, echo reportedly showed normal LV function. Patient states that dyspnea is  better since admission, however she continues to have persistent left lower lung area chest pain and she is very concerned if lung cancer has occurred. She had noncontrast CT scan done which did not show significant changes. Plan is to obtain PET scan for restaging lung cancer to evaluate for any local recurrence or metastatic disease, since this would also help with decision making now or in the future with regards to goals of treatment if she continues to have decline in condition. Patient is agreeable to this. Will order a PET scan for tomorrow and follow-up. Thank you for the referral, please feel free to contact me finishing questions.

## 2014-09-17 NOTE — Clinical Social Work Note (Signed)
Clinical Social Work Assessment  Patient Details  Name: Whitney Wright MRN: [id number] Date of Birth: 23-Jan-1941  Date of referral:  09/16/14               Reason for consult:  Facility Placement                Permission sought to share information with:  Family Supports, Customer service manager Permission granted to share information::  Yes, Verbal Permission Granted  Name::     Arvil Persons  Agency::  [facility]  Relationship::  nephew/HCPOA  Contact Information:  [phone]  Housing/Transportation Living arrangements for the past 2 months:  [place] of Information:  Patient Patient Interpreter Needed:  None Criminal Activity/Legal Involvement Pertinent to Current Situation/Hospitalization:  No - Comment as needed Significant Relationships:  Adult Children, Other Family Members Lives with:  Self, Facility Resident Do you feel safe going back to the place where you live?  Yes Need for family participation in patient care:  Yes (Comment)  Care giving concerns: Pt lives alone.   Social Worker assessment / plan:  CSW spoke to pt and her nephew/HCPOA via phone.  Pt has been receiving STR at [facility] since her discharge from [facility] on 08/29/14.  Pt is pleased with [facility] and would like to return at d/c.  CSW is awaiting a return call from [facility] to determine if pt is able to return.    Employment status:  Retired Nurse, adult PT Recommendations:  Not assessed at this time Information / Referral to community resources:  [place]  Patient/Family's Response to care: Pt was pleasant and appreciative of CSW assistance.   Patient/Family's Understanding of and Emotional Response to Diagnosis, Current Treatment, and Prognosis:  Pt is agreeable to returning to STR at [facility].  Pt's nephew expressed frustration related to the care pt received in the facility but stated he  wants to please pt and is agreeable to her returning to [facility].  Emotional Assessment Appearance:  Appears stated age Attitude/Demeanor/Rapport:  Lethargic Affect (typically observed):  Accepting, Appropriate, Calm Orientation:  Oriented to Self, Oriented to Place, Oriented to  Time, Oriented to Situation Alcohol / Substance use:  Tobacco Use (former smoker) Psych involvement (Current and /or in the community):  No (Comment)  Discharge Needs  Concerns to be addressed:  Discharge Planning Concerns Readmission within the last 30 days:  Yes Current discharge risk:  None Barriers to Discharge:  No Barriers Identified   Naida Sleight, LCSW 09/17/2014, 9:34 AM

## 2014-09-17 NOTE — Clinical Social Work Note (Signed)
CSW received a return call from [facility].  Pt is able to return to the facility at d/c.  CSW will continue to follow and update the facility as needed.  [place], [place]

## 2014-09-17 NOTE — Care Management Important Message (Signed)
Important Message  Patient Details  Name: Whitney Wright MRN: [id number] Date of Birth: [birth year]   Medicare Important Message Given:  Yes-second notification given    [name] 09/17/2014, 9:42 AM

## 2014-09-18 LAB — CBC WITH DIFFERENTIAL/PLATELET
BASOS PCT: 0 %
Basophils Absolute: 0 10*3/uL (ref 0–0.1)
EOS ABS: 0.1 10*3/uL (ref 0–0.7)
EOS PCT: 2 %
HCT: 26.9 % — ABNORMAL LOW (ref 35.0–47.0)
Hemoglobin: 8.7 g/dL — ABNORMAL LOW (ref 12.0–16.0)
Lymphocytes Relative: 8 %
Lymphs Abs: 0.5 10*3/uL — ABNORMAL LOW (ref 1.0–3.6)
MCH: 27 pg (ref 26.0–34.0)
MCHC: 32.4 g/dL (ref 32.0–36.0)
MCV: 83.3 fL (ref 80.0–100.0)
Monocytes Absolute: 0.5 10*3/uL (ref 0.2–0.9)
Monocytes Relative: 9 %
NEUTROS ABS: 5.1 10*3/uL (ref 1.4–6.5)
Neutrophils Relative %: 81 %
Platelets: 183 10*3/uL (ref 150–440)
RBC: 3.23 MIL/uL — ABNORMAL LOW (ref 3.80–5.20)
RDW: 14.9 % — AB (ref 11.5–14.5)
WBC: 6.2 10*3/uL (ref 3.6–11.0)

## 2014-09-18 LAB — EXPECTORATED SPUTUM ASSESSMENT W GRAM STAIN, RFLX TO RESP C

## 2014-09-18 LAB — GLUCOSE, CAPILLARY
GLUCOSE-CAPILLARY: 153 mg/dL — AB (ref 65–99)
Glucose-Capillary: 111 mg/dL — ABNORMAL HIGH (ref 65–99)
Glucose-Capillary: 170 mg/dL — ABNORMAL HIGH (ref 65–99)
Glucose-Capillary: 163 mg/dL — ABNORMAL HIGH (ref 65–99)

## 2014-09-18 LAB — EXPECTORATED SPUTUM ASSESSMENT W REFEX TO RESP CULTURE

## 2014-09-18 LAB — BASIC METABOLIC PANEL
Anion gap: 8 (ref 5–15)
BUN: 78 mg/dL — ABNORMAL HIGH (ref 6–20)
CHLORIDE: 101 mmol/L (ref 101–111)
CO2: 31 mmol/L (ref 22–32)
CREATININE: 2.13 mg/dL — AB (ref 0.44–1.00)
Calcium: 7.9 mg/dL — ABNORMAL LOW (ref 8.9–10.3)
GFR calc Af Amer: 25 mL/min — ABNORMAL LOW (ref >60)
GFR, EST NON AFRICAN AMERICAN: 21 mL/min — AB (ref >60)
GLUCOSE: 162 mg/dL — AB (ref 65–99)
Potassium: 3.8 mmol/L (ref 3.5–5.1)
Sodium: 140 mmol/L (ref 135–145)

## 2014-09-18 MED ORDER — TRAMADOL HCL 50 MG PO TABS
50.0000 mg | ORAL_TABLET | Freq: Two times a day (BID) | ORAL | Status: DC | PRN
  Administered 2014-09-18: 50 mg via ORAL
  Filled 2014-09-18 (×2): qty 1

## 2014-09-18 MED ORDER — GUAIFENESIN ER 600 MG PO TB12
600.0000 mg | ORAL_TABLET | Freq: Two times a day (BID) | ORAL | Status: DC
  Administered 2014-09-18 – 2014-09-21 (×7): 600 mg via ORAL
  Filled 2014-09-18 (×7): qty 1

## 2014-09-18 NOTE — Plan of Care (Signed)
Problem: Discharge Progression Outcomes Goal: Other Discharge Outcomes/Goals Outcome: Progressing Patient is alert and oriented, c/o left chest pain. Tylenol given with improvement. 1 assist to [facility]. Resting quietly.

## 2014-09-18 NOTE — Clinical Social Work Note (Signed)
CSW spoke with PT. PT recommends pt return to SNF at discharge. CSW will initiate [facility] auth when pt is medically stable. CSW will continue to follow.   Darden Dates, MSW, LCSW Clinical Social Worker [phone]

## 2014-09-18 NOTE — Evaluation (Signed)
Physical Therapy Evaluation Patient Details Name: Whitney Wright MRN: [id number] DOB: [birth year] Today's Date: 09/18/2014   History of Present Illness  Patient is a 74 y/o female admitted from SNF with acute on chronic respiratory failure. Patient has a history of COPD and lung cancer.   Clinical Impression  Patient is a 74 y/o female that was recently discharged to SNF, and was re-admitted from SNF with acute shortness of breath on chronic respiratory failure. Patient had previously been living independently at home by herself, however she has since declined in functional mobility and strength. She now requires 2L of O2 and fatigued quite quickly with ambulation (100 feet) and displayed oxygen decrease from 95% to 91% and increased HR from 70 bpm to 91 bpm. Patient became mildly dyspnic on exertion, but had no loss of balance. 5x sit to stand time indicative of weakness and high falls risk (20 seconds). Given her cardiopulmonary limitations patient would benefit from short term rehabiltation to increase her independence and tolerance for mobility. Skilled acute PT services are indicated for the above deficits.     Follow Up Recommendations SNF    Equipment Recommendations  Rolling walker with 5" wheels;3in1 (PT)    Recommendations for Other Services       Precautions / Restrictions Precautions Precautions: Fall Restrictions Weight Bearing Restrictions: No      Mobility  Bed Mobility Overal bed mobility: Needs Assistance Bed Mobility: Supine to Sit     Supine to sit: Supervision     General bed mobility comments: Patient is able to transfer supine to sit with handrails and no physical assistance.   Transfers Overall transfer level: Needs assistance Equipment used: Rolling walker (2 wheeled) Transfers: Sit to/from Stand Sit to Stand: Supervision         General transfer comment: Patient is able to transfer sit to stand with RW from both bed and chair with no  physical assistance.   Ambulation/Gait Ambulation/Gait assistance: Min guard Ambulation Distance (Feet): 100 Feet Assistive device: Rolling walker (2 wheeled) Gait Pattern/deviations: Decreased step length - left;Decreased stance time - right   Gait velocity interpretation: Below normal speed for age/gender General Gait Details: Patient experienced dyspnea on exertion and displayed elevation in HR from 70 bpm to 91 bpm and decreased O2 sats from 95% to 91% during limited ambulation on 2L of O2. No loss of balance noted with reciprocal gait, though shortened step length.   Stairs            Wheelchair Mobility    Modified Rankin (Stroke Patients Only)       Balance Overall balance assessment: Needs assistance Sitting-balance support: Feet unsupported Sitting balance-Leahy Scale: Good     Standing balance support: Bilateral upper extremity supported Standing balance-Leahy Scale: Fair Standing balance comment: Patient is able to stand independently, however she has a 5x sit to stand time of 20 seconds.                             Pertinent Vitals/Pain Pain Assessment:  (Patient does not indicate any pain during this evaluation. )    Home Living Family/patient expects to be discharged to:: Skilled nursing facility Living Arrangements: Alone Available Help at Discharge: Friend(s);Neighbor (No reliable assistance. ) Type of Home: Apartment Home Access: Stairs to enter;Ramped entrance Entrance Stairs-Rails: Can reach both Entrance Stairs-Number of Steps: 4 Home Layout: One level Home Equipment: None      Prior Function Level of  Independence: Independent         Comments: Had been ambulating without a device prior to these admissions.      Hand Dominance        Extremity/Trunk Assessment   Upper Extremity Assessment: Overall WFL for tasks assessed           Lower Extremity Assessment: Overall WFL for tasks assessed          Communication   Communication: No difficulties  Cognition Arousal/Alertness: Awake/alert Behavior During Therapy: WFL for tasks assessed/performed Overall Cognitive Status: Within Functional Limits for tasks assessed                      General Comments      Exercises General Exercises - Lower Extremity Long Arc Quad: AROM;Both;10 reps Heel Slides: AROM;Both;10 reps Straight Leg Raises: AROM;Both;10 reps Other Exercises Other Exercises: 5x sit to stand - 20 seconds.       Assessment/Plan    PT Assessment Patient needs continued PT services  PT Diagnosis Difficulty walking;Generalized weakness   PT Problem List Decreased strength;Decreased activity tolerance;Decreased mobility;Cardiopulmonary status limiting activity;Decreased balance  PT Treatment Interventions DME instruction;Gait training;Stair training;Functional mobility training;Therapeutic activities;Therapeutic exercise;Balance training;Patient/family education;Neuromuscular re-education   PT Goals (Current goals can be found in the Care Plan section) Acute Rehab PT Goals Patient Stated Goal: To increase her ambulation safety, tolerance, and independence.  PT Goal Formulation: With patient Time For Goal Achievement: 10/02/14 Potential to Achieve Goals: Good    Frequency Min 2X/week   Barriers to discharge Decreased caregiver support Patient would have assistance from neighbors, but does not have 24 hour assistance and neighbors would not be there consistently to assist her.     Co-evaluation               End of Session Equipment Utilized During Treatment: Gait belt;Oxygen Activity Tolerance: Patient tolerated treatment well;Patient limited by fatigue Patient left: in chair;with chair alarm set Nurse Communication: Mobility status         Time: [phone] (10 minutes for exercises as patient required rest breaks.) PT Time Calculation (min) (ACUTE ONLY): 21 min   Charges:   PT  Evaluation $Initial PT Evaluation Tier I: 1 Procedure PT Treatments $Therapeutic Exercise: 8-22 mins   PT G Codes:       Kerman Passey, PT, DPT    09/18/2014, 4:15 PM

## 2014-09-18 NOTE — Progress Notes (Signed)
PROGRESS NOTE  Whitney Wright ACZ:[id number] DOB: 05-08-1940 DOA: 09/15/2014 PCP: Tracie Harrier, MD  Subjective: 74 y/o f with hx of COPD, Chronic Respiratory Failure, Ca Lung, Type 2 DM, HTN , recently discharged from [facility]- readmitted with increasing shortness of breath and Hypoxemia. Pt also reports 14 lb weight gain  ECHO: EF 55 to 60% This am c/o Pain in left side of chest .Cough with productive sputum   Consultants: Dr. Ma Hillock- Oncology   Objective: BP 169/61 mmHg  Pulse 85  Temp(Src) 98.5 F (36.9 C) (Oral)  Resp 20  Ht 5' (1.524 m)  Wt 71.351 kg (157 lb 4.8 oz)  BMI 30.72 kg/m2  SpO2 95%  Intake/Output Summary (Last 24 hours) at 09/18/14 0824 Last data filed at 09/18/14 0640  Gross per 24 hour  Intake    480 ml  Output   2500 ml  Net  -2020 ml   Filed Weights   09/16/14 0424 09/17/14 0500 09/18/14 0452  Weight: 79.516 kg (175 lb 4.8 oz) 81.194 kg (179 lb) 71.351 kg (157 lb 4.8 oz)    Exam:  HEENT: [place], AT  General:  NAD  Cardiovascular: S1 S2   Respiratory: Rhonchi + Bilat  Decreased air enetry left base   Abdomen: Soft . Non tender  Neuro:Non Focal   Data Reviewed: Basic Metabolic Panel:  Recent Labs Lab 09/15/14 1744 09/16/14 0440 09/17/14 0429 09/18/14 0437  NA 132* 134* 137 140  K 4.3 4.0 3.7 3.8  CL 95* 95* 95* 101  CO2 '28 30 29 31  '$ GLUCOSE 221* 297* 287* 162*  BUN 79* 87* 87* 78*  CREATININE 2.29* 2.48* 2.36* 2.13*  CALCIUM 7.8* 8.1* 8.0* 7.9*   Liver Function Tests:  Recent Labs Lab 09/15/14 1744 09/17/14 0429  AST 20 18  ALT 26 23  ALKPHOS 69 62  BILITOT 0.3 0.3  PROT 5.5* 4.9*  ALBUMIN 2.1* 2.0*   No results for input(s): LIPASE, AMYLASE in the last 168 hours. No results for input(s): AMMONIA in the last 168 hours. CBC:  Recent Labs Lab 09/15/14 1744 09/16/14 0440 09/17/14 0429 09/18/14 0437  WBC 7.6 7.4 9.0 6.2  NEUTROABS  --   --  7.6* 5.1  HGB 9.9* 8.9* 8.8* 8.7*  HCT 30.3* 26.8*  27.2* 26.9*  MCV 83.8 82.0 83.5 83.3  PLT 154 148* 183 183   Cardiac Enzymes:    Recent Labs Lab 09/15/14 1744  TROPONINI <0.03   BNP (last 3 results)  Recent Labs  08/17/14 1006 09/15/14 1744  BNP 390.0* 558.0*    ProBNP (last 3 results) No results for input(s): PROBNP in the last 8760 hours.  CBG:  Recent Labs Lab 09/17/14 0742 09/17/14 1108 09/17/14 1627 09/17/14 2143 09/18/14 0729  GLUCAP 154* 177* 145* 227* 111*    Recent Results (from the past 240 hour(s))  Culture, blood (routine x 2)     Status: None (Preliminary result)   Collection Time: 09/15/14  5:44 PM  Result Value Ref Range Status   Specimen Description BLOOD RIGHT ASSIST CONTROL  Final   Special Requests   Final    BOTTLES DRAWN AEROBIC AND ANAEROBIC  AER 6CC ANA 8CC   Culture NO GROWTH 2 DAYS  Final   Report Status PENDING  Incomplete  Culture, blood (routine x 2)     Status: None (Preliminary result)   Collection Time: 09/15/14  5:47 PM  Result Value Ref Range Status   Specimen Description BLOOD RIGHT FATTY CASTS  Final  Special Requests   Final    BOTTLES DRAWN AEROBIC AND ANAEROBIC  AER 5CC ANA [place]   Culture NO GROWTH 2 DAYS  Final   Report Status PENDING  Incomplete     Studies: No results found.  Scheduled Meds: . aspirin EC  81 mg Oral Daily  . furosemide  40 mg Oral Daily  . heparin  5,000 Units Subcutaneous 3 times per day  . hydrALAZINE  50 mg Oral BID  . insulin aspart  0-5 Units Subcutaneous QHS  . insulin aspart  0-9 Units Subcutaneous TID WC  . insulin glargine  18 Units Subcutaneous QHS  . levothyroxine  100 mcg Oral QAC breakfast  . levothyroxine  88 mcg Oral QAC breakfast  . losartan  100 mg Oral Daily  . mometasone-formoterol  2 puff Inhalation BID  . tamoxifen  20 mg Oral Daily  . tiotropium  18 mcg Inhalation Daily  . 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 For PET scan today Check sputum cultures 3 COPD: Continue SVN's 4 HTN; On Losartan and Hydralazine- Continue to monitor 5 Type 2 DM: On  Lantus insulin and SSI 6 CKD: Se Creat is 2.13 - Continue to monitor  7 Physical Therapy   Code Status: DNR        Aksel Bencomo   09/18/2014, 8:24 AM  LOS: 3 days

## 2014-09-18 NOTE — Plan of Care (Signed)
Problem: Discharge Progression Outcomes Goal: Other Discharge Outcomes/Goals Outcome: Progressing Plan of Care Progress to Goal:  Pt was supposed to have PET scan to restage Lung Ca (in upper lobe just diagnosed in Feb. 2015) today - but contrast had to come from [place].  Scheduled for 10:30 tomorrow.  Pt to be NPO after MN but can have sips of water.  No insulin 4 hrs before procedure.  Pt coughing up thick yellow sputum - need to get specimen for gram stain culture.  Today's specimen was rejected.  Started on mucinex.  Also got order for stronger pain medicine.  Pt didn't require any. Lungs sounds more coarse on posterior side.

## 2014-09-19 ENCOUNTER — Encounter: Payer: Self-pay | Admitting: Radiology

## 2014-09-19 ENCOUNTER — Inpatient Hospital Stay: Payer: Commercial Managed Care - HMO

## 2014-09-19 LAB — CBC WITH DIFFERENTIAL/PLATELET
BASOS ABS: 0 10*3/uL (ref 0–0.1)
Basophils Relative: 0 %
Eosinophils Absolute: 0.2 10*3/uL (ref 0–0.7)
Eosinophils Relative: 3 %
HCT: 27.6 % — ABNORMAL LOW (ref 35.0–47.0)
HEMOGLOBIN: 9 g/dL — AB (ref 12.0–16.0)
Lymphocytes Relative: 8 %
Lymphs Abs: 0.5 10*3/uL — ABNORMAL LOW (ref 1.0–3.6)
MCH: 27.4 pg (ref 26.0–34.0)
MCHC: 32.8 g/dL (ref 32.0–36.0)
MCV: 83.7 fL (ref 80.0–100.0)
Monocytes Absolute: 0.4 10*3/uL (ref 0.2–0.9)
Monocytes Relative: 7 %
Neutro Abs: 5.5 10*3/uL (ref 1.4–6.5)
Neutrophils Relative %: 82 %
PLATELETS: 181 10*3/uL (ref 150–440)
RBC: 3.29 MIL/uL — ABNORMAL LOW (ref 3.80–5.20)
RDW: 15.2 % — AB (ref 11.5–14.5)
WBC: 6.7 10*3/uL (ref 3.6–11.0)

## 2014-09-19 LAB — BASIC METABOLIC PANEL
ANION GAP: 8 (ref 5–15)
BUN: 62 mg/dL — AB (ref 6–20)
CHLORIDE: 104 mmol/L (ref 101–111)
CO2: 29 mmol/L (ref 22–32)
Calcium: 8 mg/dL — ABNORMAL LOW (ref 8.9–10.3)
Creatinine, Ser: 1.99 mg/dL — ABNORMAL HIGH (ref 0.44–1.00)
GFR calc non Af Amer: 23 mL/min — ABNORMAL LOW (ref >60)
GFR, EST AFRICAN AMERICAN: 27 mL/min — AB (ref >60)
Glucose, Bld: 171 mg/dL — ABNORMAL HIGH (ref 65–99)
POTASSIUM: 3.9 mmol/L (ref 3.5–5.1)
Sodium: 141 mmol/L (ref 135–145)

## 2014-09-19 LAB — GLUCOSE, CAPILLARY
GLUCOSE-CAPILLARY: 105 mg/dL — AB (ref 65–99)
GLUCOSE-CAPILLARY: 92 mg/dL (ref 65–99)
GLUCOSE-CAPILLARY: 84 mg/dL (ref 65–99)
GLUCOSE-CAPILLARY: 206 mg/dL — AB (ref 65–99)
Glucose-Capillary: 131 mg/dL — ABNORMAL HIGH (ref 65–99)
Glucose-Capillary: 243 mg/dL — ABNORMAL HIGH (ref 65–99)

## 2014-09-19 MED ORDER — FLUDEOXYGLUCOSE F - 18 (FDG) INJECTION
11.5700 | Freq: Once | INTRAVENOUS | Status: AC | PRN
  Administered 2014-09-19: 11:00:00 11.57 via INTRAVENOUS

## 2014-09-19 MED ORDER — HYDRALAZINE HCL 20 MG/ML IJ SOLN
10.0000 mg | Freq: Once | INTRAMUSCULAR | Status: AC
  Administered 2014-09-19: 07:00:00 10 mg via INTRAVENOUS
  Filled 2014-09-19: qty 1

## 2014-09-19 NOTE — Consult Note (Signed)
Pt with multiple major problems including Lung cancer, severe COPD, recent pneumonia.  She had a PET scan done which lit up in the rectum.  I recommend to her a brief flex sig with an upper scope without any anesthesia for tomorrow and she is agreeable to that.

## 2014-09-19 NOTE — Progress Notes (Signed)
PT Cancellation Note  Patient Details Name: Whitney Wright MRN: [id number] DOB: May 02, 1940   Cancelled Treatment:    Reason Eval/Treat Not Completed: Patient at procedure or test/unavailable (Treatment attempted; patient currently off unit for PET scan.  Will attempt at later time/date as available and medically appropriate.)   [name] H. Owens Shark, PT, DPT, NCS 09/19/2014, 11:07 AM [phone]

## 2014-09-19 NOTE — Care Management Important Message (Signed)
Important Message  Patient Details  Name: Whitney Wright MRN: [id number] Date of Birth: Nov 08, 1940   Medicare Important Message Given:  Yes-third notification given    Juliann Pulse A Allmond 09/19/2014, 10:07 AM

## 2014-09-19 NOTE — Progress Notes (Signed)
PT Cancellation Note  Patient Details Name: Whitney Wright MRN: [id number] DOB: September 08, 1940   Cancelled Treatment:    Reason Eval/Treat Not Completed: Patient declined, no reason specified. Treatment attempted; pt refused stating she is too tired after the testing today and wishes to try tomorrow.    [name] 09/19/2014, 3:00 PM

## 2014-09-19 NOTE — Progress Notes (Signed)
Dr. Mike Gip who is on-call today notified pt nephew/POA, Yevonne Aline [phone] or [phone]) would like to be informed of PET Scan results.

## 2014-09-19 NOTE — Progress Notes (Signed)
PROGRESS NOTE  Whitney Wright BJY:[id number] DOB: 1940/08/03 DOA: 09/15/2014 PCP: Tracie Harrier, MD  Subjective: 74 y/o f with hx of COPD, Chronic Respiratory Failure, Ca Lung, Type 2 DM, HTN , recently discharged from [facility]- readmitted with increasing shortness of breath and Hypoxemia. Pt also reports 14 lb weight gain  ECHO: EF 55 to 60% This am states she is doing better   Consultants: Dr. Ma Hillock- Oncology   Objective: BP 176/60 mmHg  Pulse 89  Temp(Src) 98.5 F (36.9 C) (Oral)  Resp 20  Ht 5' (1.524 m)  Wt 71.759 kg (158 lb 3.2 oz)  BMI 30.90 kg/m2  SpO2 92%  Intake/Output Summary (Last 24 hours) at 09/19/14 0824 Last data filed at 09/19/14 0114  Gross per 24 hour  Intake    480 ml  Output   1000 ml  Net   -520 ml   Filed Weights   09/17/14 0500 09/18/14 0452 09/19/14 0515  Weight: 81.194 kg (179 lb) 71.351 kg (157 lb 4.8 oz) 71.759 kg (158 lb 3.2 oz)    Exam:  HEENT: [name], AT  General:  NAD  Cardiovascular: S1 S2   Respiratory: Rhonchi + Bilat  Decreased air enetry left base   Abdomen: Soft . Non tender  Neuro:Non Focal   Data Reviewed: Basic Metabolic Panel:  Recent Labs Lab 09/15/14 1744 09/16/14 0440 09/17/14 0429 09/18/14 0437 09/19/14 0500  NA 132* 134* 137 140 141  K 4.3 4.0 3.7 3.8 3.9  CL 95* 95* 95* 101 104  CO2 '28 30 29 31 29  '$ GLUCOSE 221* 297* 287* 162* 171*  BUN 79* 87* 87* 78* 62*  CREATININE 2.29* 2.48* 2.36* 2.13* 1.99*  CALCIUM 7.8* 8.1* 8.0* 7.9* 8.0*   Liver Function Tests:  Recent Labs Lab 09/15/14 1744 09/17/14 0429  AST 20 18  ALT 26 23  ALKPHOS 69 62  BILITOT 0.3 0.3  PROT 5.5* 4.9*  ALBUMIN 2.1* 2.0*   No results for input(s): LIPASE, AMYLASE in the last 168 hours. No results for input(s): AMMONIA in the last 168 hours. CBC:  Recent Labs Lab 09/15/14 1744 09/16/14 0440 09/17/14 0429 09/18/14 0437 09/19/14 0500  WBC 7.6 7.4 9.0 6.2 6.7  NEUTROABS  --   --  7.6* 5.1 5.5  HGB  9.9* 8.9* 8.8* 8.7* 9.0*  HCT 30.3* 26.8* 27.2* 26.9* 27.6*  MCV 83.8 82.0 83.5 83.3 83.7  PLT 154 148* 183 183 181   Cardiac Enzymes:    Recent Labs Lab 09/15/14 1744  TROPONINI <0.03   BNP (last 3 results)  Recent Labs  08/17/14 1006 09/15/14 1744  BNP 390.0* 558.0*    ProBNP (last 3 results) No results for input(s): PROBNP in the last 8760 hours.  CBG:  Recent Labs Lab 09/18/14 0729 09/18/14 1148 09/18/14 1611 09/18/14 2200 09/19/14 0711  GLUCAP 111* 170* 163* 153* 105*    Recent Results (from the past 240 hour(s))  Culture, blood (routine x 2)     Status: None (Preliminary result)   Collection Time: 09/15/14  5:44 PM  Result Value Ref Range Status   Specimen Description BLOOD RIGHT ASSIST CONTROL  Final   Special Requests   Final    BOTTLES DRAWN AEROBIC AND ANAEROBIC  AER 6CC ANA 8CC   Culture NO GROWTH 2 DAYS  Final   Report Status PENDING  Incomplete  Culture, blood (routine x 2)     Status: None (Preliminary result)   Collection Time: 09/15/14  5:47 PM  Result Value  Ref Range Status   Specimen Description BLOOD RIGHT FATTY CASTS  Final   Special Requests   Final    BOTTLES DRAWN AEROBIC AND ANAEROBIC  AER 5CC ANA [place]   Culture NO GROWTH 2 DAYS  Final   Report Status PENDING  Incomplete  Culture, expectorated sputum-assessment     Status: None   Collection Time: 09/18/14  1:11 PM  Result Value Ref Range Status   Specimen Description EXPECTORATED SPUTUM  Final   Special Requests NONE  Final   Sputum evaluation   Final    Sputum specimen not acceptable for testing.  Please recollect.     Report Status 09/18/2014 FINAL  Final     Studies: No results found.  Scheduled Meds: . aspirin EC  81 mg Oral Daily  . furosemide  40 mg Oral Daily  . guaiFENesin  600 mg Oral BID  . heparin  5,000 Units Subcutaneous 3 times per day  . hydrALAZINE  50 mg Oral BID  . insulin aspart  0-5 Units Subcutaneous QHS  . insulin aspart  0-9 Units Subcutaneous TID  WC  . insulin glargine  18 Units Subcutaneous QHS  . levothyroxine  100 mcg Oral QAC breakfast  . levothyroxine  88 mcg Oral QAC breakfast  . losartan  100 mg Oral Daily  . mometasone-formoterol  2 puff Inhalation BID  . tamoxifen  20 mg Oral Daily  . tiotropium  18 mcg Inhalation Daily  . 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 For PET scan today Awaiting  sputum cultures 3 COPD: Continue SVN's 4 HTN; On Losartan and Hydralazine- Continue to monitor 5 Type 2 DM: On  Lantus insulin and SSI 6 CKD: Se Creat is 1.99- Continue to monitor  7 Physical Therapy   Code Status: DNR        Dior Dominik   09/19/2014, 8:24 AM  LOS: 4 days

## 2014-09-19 NOTE — Plan of Care (Signed)
Problem: Discharge Progression Outcomes Goal: Other Discharge Outcomes/Goals Outcome: Progressing Plan of care progress to goal for: 1. Pain-no c/o pain this shift 2. Hemodynamically-             -VSS 3. Complications-Pt had high BP this morning, one time IV order given  4. Diet-pt tolerating diet this shift, now NPO for procedure 5. Activity-pt up with 1 assist to [facility]

## 2014-09-19 NOTE — Plan of Care (Addendum)
Problem: Discharge Progression Outcomes Goal: Other Discharge Outcomes/Goals Plan of care progress to goal for: 1. No c/o pain throughout day 2. Hemodynamically:              -VSS- BP increased due to delay in AM meds due to PET Scan.              -O2 sats stable on 1L [place] but SOB w/ exertion   -non-productive cough intermittently. Still need sputum sample. 3. Tolerating diet well.  4. +1 assist to [facility].

## 2014-09-19 NOTE — Consult Note (Signed)
GI Inpatient Consult Note  Reason for Consult: Positive PET scan rectum/anal area   Attending Requesting Consult: Dr. Ginette Pitman  History of Present Illness: Whitney Wright is a 74 y.o. female Is currently being treated for progressively worsening shortness of breath.  She was hospitalized from June 22 until June 30 with pneumonia.  She was discharged to schedule it skilled nursing facility.  She is still experiencing shortness of breath on 2 L of oxygen.  She also has increased edema. She has a productive cough with pale yellow sputum.  She also endorses nausea as well as fatigue.  She denies any changes in her bowel movements.  She has a history of breast cancer in 2012. She also has had 2 episodes of lung cancer 1 in February, 2015 and then again in the fall of 2015. She received radiation both times.  She has advanced age COPD and is on 2 L of oxygen at home.  She endorses taking aspirin on a daily basis, no other blood thinners.  She had a PET scan done on the twenty-first of July that showed intense focus of increased uptake identified within the lower rectum / anus area.  Correlation with direct visualization would be advised dear  She reports she has never had a colonoscopy before.  Past Medical History:  Past Medical History  Diagnosis Date  . Hypertension   . Cellulitis and abscess of trunk   . Thyroid disease     hypo  . Arthritis   . Personal history of malignant neoplasm of breast   . Diabetes mellitus without complication   . Senile osteoporosis   . 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 ER 50%, PR 0, HER-2/neu nonamplified.. Tamoxifen chosen as bone density showed severe osteoporosis.  . Lung cancer, upper lobe February 2015    Adenocarcinoma with lepidic pattern, second primary fall 2015  . Breast cancer   . COPD (chronic obstructive pulmonary  disease)   . Shortness of breath dyspnea   . Chronic kidney disease     Problem List: Patient Active Problem List   Diagnosis Date Noted  . Primary lung cancer   . Acute on chronic respiratory failure 08/21/2014  . Lung cancer, upper lobe 04/01/2013  . Personal history of breast cancer 08/28/2012  . Family history of breast cancer 08/28/2012    Past Surgical History: Past Surgical History  Procedure Laterality Date  . Abdominal hysterectomy  age 74 . Mammosite balloon placement Left 2012  . Cardiac catheterization  2014  . Breast surgery Left January 11, 2011    left breast stereotactic biopsy: DCIS  . Appendectomy      Allergies: Allergies  Allergen Reactions  . Hydrocodone-Acetaminophen Rash  . Macrodantin [Nitrofurantoin] Rash    Home Medications: Prescriptions prior to admission  Medication Sig Dispense Refill Last Dose  . ADVAIR DISKUS 250-50 MCG/DOSE AEPB Inhale 1 puff into the lungs 2 (two) times daily.   5 unknown  . albuterol (PROVENTIL HFA;VENTOLIN HFA) 108 (90 BASE) MCG/ACT inhaler Inhale 2 puffs into the lungs every 6 (six) hours as needed for wheezing or shortness of breath.    unknown  . ALPRAZolam (XANAX) 0.25 MG tablet Take 0.25 mg by mouth at bedtime as needed.   3 unknown  . Alum & Mag Hydroxide-Simeth (MAG-AL PLUS XS PO) Take 30 mLs by mouth every 4 (four) hours as needed (  for gas/ingestion.).   unknown  . [EXPIRED] amoxicillin-clavulanate (AUGMENTIN) 500-125 MG per tablet Take 1 tablet by mouth every 12 (twelve) hours.   unknown  . aspirin EC 81 MG tablet Take 81 mg by mouth daily.   unknown  . calcium carbonate (OS-CAL) 600 MG TABS tablet Take 600 mg by mouth 2 (two) times daily with a meal.   unknown  . calcium carbonate (TUMS - DOSED IN MG ELEMENTAL CALCIUM) 500 MG chewable tablet Chew 2 tablets by mouth every 4 (four) hours as needed for indigestion.   unknown  . chlorpheniramine-HYDROcodone (TUSSIONEX PENNKINETIC ER) 10-8 MG/5ML SUER Take 5 mLs by  mouth 2 (two) times daily. 140 mL 0 unknown  . Cholecalciferol 1000 UNITS TBDP Take 1,000 Units by mouth daily.    unknown  . GLUCERNA (GLUCERNA) LIQD Take 1 Can by mouth 2 (two) times daily between meals.   unknown  . hydrALAZINE (APRESOLINE) 50 MG tablet Take 1 tablet (50 mg total) by mouth 2 (two) times daily. 60 tablet 3 unknown  . hydrocortisone cream 1 % Apply 1 application topically 4 (four) times daily as needed (for dry hemorrhoids.).   unknown  . insulin NPH Human (HUMULIN N,NOVOLIN N) 100 UNIT/ML injection Inject into the skin 2 (two) times daily. Use as directed twice daily.   unknown  . insulin regular (NOVOLIN R,HUMULIN R) 100 units/mL injection Inject into the skin as needed for high blood sugar. Use as sliding scale.   unknown  . ipratropium-albuterol (DUONEB) 0.5-2.5 (3) MG/3ML SOLN Inhale 3 mLs into the lungs See admin instructions. Use 1 vial (56m) via nebulizer every 8 hours, and 1 vial (365m) via nebulizer every 4 hours as needed for wheezing /shortness of breath.  5 unknown  . levothyroxine (SYNTHROID, LEVOTHROID) 100 MCG tablet Take 100 mcg by mouth daily before breakfast. Take along witg 88 mcg tablet for total dose of 188 mcg.   unknown  . levothyroxine (SYNTHROID, LEVOTHROID) 88 MCG tablet Take 88 mcg by mouth daily before breakfast. Take along with 100 mcg tablet for a total dose of 188 mcg.   unknown  . losartan (COZAAR) 100 MG tablet Take 1 tablet by mouth daily.   unknown  . lovastatin (MEVACOR) 20 MG tablet Take 1 tablet by mouth daily.   unknown  . magnesium hydroxide (MILK OF MAGNESIA) 400 MG/5ML suspension Take 30 mLs by mouth daily as needed for mild constipation or moderate constipation.   unknown  . ondansetron (ZOFRAN) 4 MG tablet Take 4 mg by mouth every 4 (four) hours as needed for nausea.   unknown  . oxyCODONE-acetaminophen (PERCOCET/ROXICET) 5-325 MG per tablet Take 1 tablet by mouth every 6 (six) hours as needed (chest pain). 30 tablet 0 unknown  . tamoxifen  (NOLVADEX) 10 MG tablet Take 20 mg by mouth daily.   unknown  . tiotropium (SPIRIVA) 18 MCG inhalation capsule Place 18 mcg into inhaler and inhale daily.   unknown  . torsemide (DEMADEX) 20 MG tablet Take 20 mg by mouth daily.   unknown  . verapamil (CALAN-SR) 240 MG CR tablet Take 240 mg by mouth daily.   unknown  . levofloxacin (LEVAQUIN) 250 MG tablet Take 1 tablet (250 mg total) by mouth daily. 14 tablet 0   . predniSONE (DELTASONE) 10 MG tablet Prednisone 10 mg tabs  Take 4 tabs po once daily for 3 days Take 3 tabs po once daily for 3 days Take 2 tabs po once daily for 3 days Take 1 tab  po once daily for 3 days And then stop  Total: 30 tablets 30 tablet 0    Home medication reconciliation was completed with the patient.   Scheduled Inpatient Medications:   . aspirin EC  81 mg Oral Daily  . furosemide  40 mg Oral Daily  . guaiFENesin  600 mg Oral BID  . heparin  5,000 Units Subcutaneous 3 times per day  . hydrALAZINE  50 mg Oral BID  . insulin aspart  0-5 Units Subcutaneous QHS  . insulin aspart  0-9 Units Subcutaneous TID WC  . insulin glargine  18 Units Subcutaneous QHS  . levothyroxine  100 mcg Oral QAC breakfast  . levothyroxine  88 mcg Oral QAC breakfast  . losartan  100 mg Oral Daily  . mometasone-formoterol  2 puff Inhalation BID  . tamoxifen  20 mg Oral Daily  . tiotropium  18 mcg Inhalation Daily  . verapamil  240 mg Oral Daily    Continuous Inpatient Infusions:     PRN Inpatient Medications:  acetaminophen **OR** acetaminophen, albuterol, ALPRAZolam, magnesium hydroxide, ondansetron **OR** ondansetron (ZOFRAN) IV, traMADol  Family History: family history includes Breast cancer in her daughter and mother; Diabetes in her mother; Hypertension in her mother and sister.  The patient's family history is negative for inflammatory bowel disorders, GI malignancy, or solid organ transplantation.  Social History:   reports that she has quit smoking. She has never  used smokeless tobacco. She reports that she does not drink alcohol or use illicit drugs.   Review of Systems: Constitutional: Some weight gain noted from last admission. Eyes: No changes in vision. ENT: No oral lesions, sore throat.  GI: see HPI.  Heme/Lymph: No easy bruising.  CV: No chest pain.  GU: No hematuria.  Integumentary: No rashes.  Neuro: No headaches.  Psych: No depression/anxiety.  Endocrine: No heat/cold intolerance.  Allergic/Immunologic: No urticaria.  Resp:positive for productive cough and SOB  Musculoskeletal: No joint swelling.    Physical Examination: BP 178/65 mmHg  Pulse 87  Temp(Src) 98 F (36.7 C) (Oral)  Resp 20  Ht 5' (1.524 m)  Wt 69.4 kg (153 lb)  BMI 29.88 kg/m2  SpO2 97% Gen: NAD, alert and oriented x 4.    She was coughing and appeared to be short of breath while speaking even with oxygen HEENT: PEERLA, EOMI, Neck: supple, no JVD or thyromegaly Chest:  Positive for wheezing, and crackles CV: RRR, no m/g/c/r Abd: soft, NT, ND, +BS in all four quadrants; no HSM, guarding, ridigity, or rebound tenderness Ext: BLE edema, well perfused with 2+ pulses, Skin: no rash or lesions noted Lymph: no LAD  Data: Lab Results  Component Value Date   WBC 6.7 09/19/2014   HGB 9.0* 09/19/2014   HCT 27.6* 09/19/2014   MCV 83.7 09/19/2014   PLT 181 09/19/2014    Recent Labs Lab 09/17/14 0429 09/18/14 0437 09/19/14 0500  HGB 8.8* 8.7* 9.0*   Lab Results  Component Value Date   NA 141 09/19/2014   K 3.9 09/19/2014   CL 104 09/19/2014   CO2 29 09/19/2014   BUN 62* 09/19/2014   CREATININE 1.99* 09/19/2014   Lab Results  Component Value Date   ALT 23 09/17/2014   AST 18 09/17/2014   ALKPHOS 62 09/17/2014   BILITOT 0.3 09/17/2014   No results for input(s): APTT, INR, PTT in the last 168 hours.   Imaging: CLINICAL DATA: Subsequent Treatment strategy for Lung cancer.  EXAM: NUCLEAR MEDICINE PET SKULL BASE  TO THIGH  TECHNIQUE: 11.57  mCi F-18 FDG was injected intravenously. Full-ring PET imaging was performed from the skull base to thigh after the radiotracer. CT data was obtained and used for attenuation correction and anatomic localization.  FASTING BLOOD GLUCOSE: Value: 92 mg/dl  COMPARISON: 01/15/2014  FINDINGS: NECK  No hypermetabolic lymph nodes in the neck.  CHEST  Right paratracheal lymph node measures 1.1 cm and has an SUV max equal to 3.26. Previously this lymph node measured 0.9 cm and had an SUV max equal to 2.7. Moderate volume left pleural effusion identified. Overlying compressive type atelectasis is noted. Left lower lobe lung nodule is identified posteriorly measuring 1.8 cm. This has an SUV max equal to 3.53. Stable appearance of subpleural nodule within the posterior medial right apex measuring 3.3 cm, image 55/series 3. No significant FDG uptake is within this structure.  ABDOMEN/PELVIS  Small nonspecific focus of increased uptake within the lower rectum/ anus has an SUV max equal to 10.0. This is compared with 5.2 previously. There is no abnormal hyper metabolism within the liver, spleen, adrenal glands, or pancreas. The visualized portions of stress that aortic atherosclerosis noted. No hypermetabolic upper abdominal or pelvic lymph nodes identified.  SKELETON  No focal hypermetabolic activity to suggest skeletal metastasis.  IMPRESSION: 1. Mild increase in size and degree of FDG uptake associated with right paratracheal lymph node. Additionally, there is a new left pleural effusion and a persistent focus of hyper metabolism within the posterior left lung base. Residual tumor cannot be excluded. 2. Stable non hypermetabolic low density, subpleural mass within the posterior medial right apex. 3. Intense focus of increased uptake is identified within the lower rectum/anus area. Correlation with direct visualization would be advised.   Electronically Signed   By: Kerby Moors M.D.  On: 09/19/2014 13:23   Assessment/Plan: Ms. Spearing is a 74 y.o. female   Recommendations: Due to present condition with a shortness of breath, still recovering from pneumonia, we will proceed with evaluation with a flex sig.  Please refer to Dr. Vira Agar note for further detail here Thank you for the consult. Please call with questions or concerns.  Lyly Canizales S Richards,PA-C  I personally performed these services.

## 2014-09-19 NOTE — Progress Notes (Signed)
   09/19/14 1420  Clinical Encounter Type  Visited With Family  Visit Type Follow-up  Referral From Nurse  Consult/Referral To Chaplain  Patient's family requested assistance with financial power of notary.  Needed a notary.  I checked with one in another department and was informed that she did not feel comfortable notarizing it.  Her co worker suggested that it was a liability issue and hospital policy to only notarize HCPOA and not financial POA.  I relayed this to the family who were surprised and disappointed but said they understood.  Family thanked me for my time and willingness to help.  [place] [phone]

## 2014-09-19 NOTE — Progress Notes (Signed)
Notified Dr Sabra Heck that pt NPO for Flexible sigmoidoscopy tomorrow, CBG 243. Lantus 18units and Novolog 2units per SSI. How much insulin do you want to give. Do you want to hold heparin? BP 181/59, hydralazine '50mg'$  scheduled. MD verbalized to give Lantus 18units and to hold heparin in am.

## 2014-09-20 ENCOUNTER — Encounter
Admission: EM | Disposition: A | Discharge status: Skilled Nursing Facility | Payer: Self-pay | Source: Home / Self Care | Attending: Internal Medicine

## 2014-09-20 ENCOUNTER — Encounter: Payer: Self-pay | Admitting: Anesthesiology

## 2014-09-20 DIAGNOSIS — C3432 Malignant neoplasm of lower lobe, left bronchus or lung: Secondary | ICD-10-CM

## 2014-09-20 DIAGNOSIS — R531 Weakness: Secondary | ICD-10-CM

## 2014-09-20 DIAGNOSIS — J189 Pneumonia, unspecified organism: Secondary | ICD-10-CM

## 2014-09-20 DIAGNOSIS — R921 Mammographic calcification found on diagnostic imaging of breast: Secondary | ICD-10-CM

## 2014-09-20 DIAGNOSIS — J9620 Acute and chronic respiratory failure, unspecified whether with hypoxia or hypercapnia: Secondary | ICD-10-CM

## 2014-09-20 DIAGNOSIS — R0600 Dyspnea, unspecified: Secondary | ICD-10-CM

## 2014-09-20 HISTORY — PX: FLEXIBLE SIGMOIDOSCOPY: SHX5431

## 2014-09-20 LAB — CBC WITH DIFFERENTIAL/PLATELET
Basophils Absolute: 0 10*3/uL (ref 0–0.1)
Basophils Relative: 1 %
Eosinophils Absolute: 0.2 10*3/uL (ref 0–0.7)
Eosinophils Relative: 2 %
HCT: 29 % — ABNORMAL LOW (ref 35.0–47.0)
Hemoglobin: 9.5 g/dL — ABNORMAL LOW (ref 12.0–16.0)
Lymphocytes Relative: 8 %
Lymphs Abs: 0.7 10*3/uL — ABNORMAL LOW (ref 1.0–3.6)
MCH: 27 pg (ref 26.0–34.0)
MCHC: 32.7 g/dL (ref 32.0–36.0)
MCV: 82.5 fL (ref 80.0–100.0)
Monocytes Absolute: 0.6 10*3/uL (ref 0.2–0.9)
Monocytes Relative: 6 %
Neutro Abs: 7.5 10*3/uL — ABNORMAL HIGH (ref 1.4–6.5)
Neutrophils Relative %: 83 %
Platelets: 184 10*3/uL (ref 150–440)
RBC: 3.52 MIL/uL — ABNORMAL LOW (ref 3.80–5.20)
RDW: 15.4 % — ABNORMAL HIGH (ref 11.5–14.5)
WBC: 9.1 10*3/uL (ref 3.6–11.0)

## 2014-09-20 LAB — BASIC METABOLIC PANEL
Anion gap: 7 (ref 5–15)
BUN: 46 mg/dL — ABNORMAL HIGH (ref 6–20)
CO2: 28 mmol/L (ref 22–32)
Calcium: 7.9 mg/dL — ABNORMAL LOW (ref 8.9–10.3)
Chloride: 106 mmol/L (ref 101–111)
Creatinine, Ser: 1.71 mg/dL — ABNORMAL HIGH (ref 0.44–1.00)
GFR calc Af Amer: 32 mL/min — ABNORMAL LOW (ref >60)
GFR, EST NON AFRICAN AMERICAN: 28 mL/min — AB (ref >60)
GLUCOSE: 140 mg/dL — AB (ref 65–99)
POTASSIUM: 3.9 mmol/L (ref 3.5–5.1)
SODIUM: 141 mmol/L (ref 135–145)

## 2014-09-20 LAB — CULTURE, BLOOD (ROUTINE X 2)
CULTURE: NO GROWTH
Culture: NO GROWTH

## 2014-09-20 LAB — GLUCOSE, CAPILLARY
GLUCOSE-CAPILLARY: 221 mg/dL — AB (ref 65–99)
Glucose-Capillary: 140 mg/dL — ABNORMAL HIGH (ref 65–99)
Glucose-Capillary: 127 mg/dL — ABNORMAL HIGH (ref 65–99)
Glucose-Capillary: 229 mg/dL — ABNORMAL HIGH (ref 65–99)

## 2014-09-20 SURGERY — SIGMOIDOSCOPY, FLEXIBLE

## 2014-09-20 MED ORDER — FLEET ENEMA 7-19 GM/118ML RE ENEM
1.0000 | ENEMA | Freq: Once | RECTAL | Status: AC
  Administered 2014-09-20: 1 via RECTAL

## 2014-09-20 MED ORDER — SODIUM CHLORIDE 0.9 % IV SOLN
INTRAVENOUS | Status: DC
  Administered 2014-09-20: 12:00:00 via INTRAVENOUS

## 2014-09-20 NOTE — Plan of Care (Signed)
Problem: Discharge Progression Outcomes Goal: Other Discharge Outcomes/Goals Outcome: Progressing Plan of Care Progress to Goal: Pt had flex sig. Today b/c PET showed areas of concern in rectal area.  Polyp found and biopsy done.  Ca not suspected.  Pt very relieved procedure is over.  Requested not to be d/ced today.  Dr. Phill Myron to call her nephew. Pt worked w/PT today.  Did well - up to chair. Pt related she's never had a colonoscopy.

## 2014-09-20 NOTE — Progress Notes (Signed)
Physical Therapy Treatment Patient Details Name: Whitney Wright MRN: [id number] DOB: Sep 06, 1940 Today's Date: 09/20/2014    History of Present Illness Patient is a 74 y/o female admitted from SNF with acute on chronic respiratory failure. Patient has a history of COPD and lung cancer. Patient had flex-sig performed on 09/20/2014 with no anesthesia.     PT Comments    Patient displaying improved affect and energy levels today, even after flex-sig performed. Patient is now on 4L of O2, and requires multiple rest breaks to complete 100' of ambulation. She is able to perform sit to stand with supervision, but does require minimal assistance with bed mobility. Patient displays improving strength with sit to stand training, however she is not safe to return home at this time as she is unable to ambulate household distances (~200 feet) without being completely exhausted. Skilled acute PT services are indicated to address the above deficits.   Follow Up Recommendations  SNF     Equipment Recommendations  Rolling walker with 5" wheels;3in1 (PT)    Recommendations for Other Services       Precautions / Restrictions      Mobility  Bed Mobility Overal bed mobility: Needs Assistance Bed Mobility: Supine to Sit     Supine to sit: Min assist     General bed mobility comments: Patient requires some assistance to bring her trunk erect as she has a minor bout of pain with sitting in her abdominal region.   Transfers Overall transfer level: Needs assistance Equipment used: Rolling walker (2 wheeled) Transfers: Sit to/from Stand Sit to Stand: Supervision         General transfer comment: Patient is able to transfer sit <--> stand using her hands on her knees on multiple repetitions.   Ambulation/Gait Ambulation/Gait assistance: Min guard Ambulation Distance (Feet): 100 Feet Assistive device: Rolling walker (2 wheeled) Gait Pattern/deviations: Decreased step length -  right;Decreased step length - left   Gait velocity interpretation: Below normal speed for age/gender General Gait Details: Patient continues to have dyspnea on exertion and has increased HR from 95 at rest to 113 with limited ambulation. Patient displays safe and appropriate technique with ambulation with no loss of balance.    Stairs            Wheelchair Mobility    Modified Rankin (Stroke Patients Only)       Balance Overall balance assessment: Needs assistance Sitting-balance support: Feet unsupported Sitting balance-Leahy Scale: Good Sitting balance - Comments: No loss of balance noted.      Standing balance-Leahy Scale: Fair Standing balance comment: Patient is able to stand independently without hand hold assistance, though continues to require RW with ambulation secondary to decreased LE strength.                     Cognition Arousal/Alertness: Awake/alert Behavior During Therapy: WFL for tasks assessed/performed Overall Cognitive Status: Within Functional Limits for tasks assessed                      Exercises Other Exercises Other Exercises: 3x sit to stand (with hands pushing off thighs) x 5 sets total.     General Comments        Pertinent Vitals/Pain Pain Assessment:  (Patient does not mention any pain during this encounter. )    Home Living                      Prior Function  PT Goals (current goals can now be found in the care plan section) Acute Rehab PT Goals Patient Stated Goal: To increase her ambulation safety, tolerance, and independence.  PT Goal Formulation: With patient Time For Goal Achievement: 10/02/14 Potential to Achieve Goals: Good Progress towards PT goals: Progressing toward goals    Frequency  Min 2X/week    PT Plan Current plan remains appropriate    Co-evaluation             End of Session Equipment Utilized During Treatment: Gait belt;Oxygen Activity Tolerance: Patient  tolerated treatment well;Patient limited by fatigue Patient left: in chair;with chair alarm set;with call bell/phone within reach     Time: 2549-8264 PT Time Calculation (min) (ACUTE ONLY): 29 min  Charges:  $Gait Training: 8-22 mins $Therapeutic Activity: 8-22 mins                    G Codes:      Kerman Passey, PT, DPT    09/20/2014, 3:20 PM

## 2014-09-20 NOTE — Progress Notes (Signed)
PROGRESS NOTE  Whitney Wright HEN:[id number] DOB: 03-18-1940 DOA: 09/15/2014 PCP: Tracie Harrier, MD  Subjective: 74 y/o f with hx of COPD, Chronic Respiratory Failure, Ca Lung, Type 2 DM, HTN , recently discharged from [facility]- readmitted with increasing shortness of breath and Hypoxemia. Pt also reports 14 lb weight gain  ECHO: EF 55 to 60% PET scan showed intense focus in lower rectum and anal area For Flex sig this am    Consultants: Dr. Ma Hillock- Oncology Dr. Aleen Sells- GI   Objective: BP 167/71 mmHg  Pulse 90  Temp(Src) 98 F (36.7 C) (Oral)  Resp 20  Ht 5' (1.524 m)  Wt 70.126 kg (154 lb 9.6 oz)  BMI 30.19 kg/m2  SpO2 99%  Intake/Output Summary (Last 24 hours) at 09/20/14 0819 Last data filed at 09/20/14 0656  Gross per 24 hour  Intake      0 ml  Output   2350 ml  Net  -2350 ml   Filed Weights   09/19/14 0515 09/19/14 1100 09/20/14 0522  Weight: 71.759 kg (158 lb 3.2 oz) 69.4 kg (153 lb) 70.126 kg (154 lb 9.6 oz)    Exam:  HEENT: [place], AT  General:  NAD  Cardiovascular: S1 S2   Respiratory: Rhonchi + Bilat  Decreased air enetry left base   Abdomen: Soft . Non tender  Neuro:Non Focal   Data Reviewed: Basic Metabolic Panel:  Recent Labs Lab 09/16/14 0440 09/17/14 0429 09/18/14 0437 09/19/14 0500 09/20/14 0442  NA 134* 137 140 141 141  K 4.0 3.7 3.8 3.9 3.9  CL 95* 95* 101 104 106  CO2 '30 29 31 29 28  '$ GLUCOSE 297* 287* 162* 171* 140*  BUN 87* 87* 78* 62* 46*  CREATININE 2.48* 2.36* 2.13* 1.99* 1.71*  CALCIUM 8.1* 8.0* 7.9* 8.0* 7.9*   Liver Function Tests:  Recent Labs Lab 09/15/14 1744 09/17/14 0429  AST 20 18  ALT 26 23  ALKPHOS 69 62  BILITOT 0.3 0.3  PROT 5.5* 4.9*  ALBUMIN 2.1* 2.0*   No results for input(s): LIPASE, AMYLASE in the last 168 hours. No results for input(s): AMMONIA in the last 168 hours. CBC:  Recent Labs Lab 09/16/14 0440 09/17/14 0429 09/18/14 0437 09/19/14 0500 09/20/14 0442  WBC  7.4 9.0 6.2 6.7 9.1  NEUTROABS  --  7.6* 5.1 5.5 7.5*  HGB 8.9* 8.8* 8.7* 9.0* 9.5*  HCT 26.8* 27.2* 26.9* 27.6* 29.0*  MCV 82.0 83.5 83.3 83.7 82.5  PLT 148* 183 183 181 184   Cardiac Enzymes:    Recent Labs Lab 09/15/14 1744  TROPONINI <0.03   BNP (last 3 results)  Recent Labs  08/17/14 1006 09/15/14 1744  BNP 390.0* 558.0*    ProBNP (last 3 results) No results for input(s): PROBNP in the last 8760 hours.  CBG:  Recent Labs Lab 09/19/14 1246 09/19/14 1619 09/19/14 2156 09/19/14 2321 09/20/14 0730  GLUCAP 84 131* 243* 206* 140*    Recent Results (from the past 240 hour(s))  Culture, blood (routine x 2)     Status: None (Preliminary result)   Collection Time: 09/15/14  5:44 PM  Result Value Ref Range Status   Specimen Description BLOOD RIGHT ASSIST CONTROL  Final   Special Requests   Final    BOTTLES DRAWN AEROBIC AND ANAEROBIC  AER 6CC ANA 8CC   Culture NO GROWTH 2 DAYS  Final   Report Status PENDING  Incomplete  Culture, blood (routine x 2)     Status: None (Preliminary  result)   Collection Time: 09/15/14  5:47 PM  Result Value Ref Range Status   Specimen Description BLOOD RIGHT FATTY CASTS  Final   Special Requests   Final    BOTTLES DRAWN AEROBIC AND ANAEROBIC  AER 5CC ANA [place]   Culture NO GROWTH 2 DAYS  Final   Report Status PENDING  Incomplete  Culture, expectorated sputum-assessment     Status: None   Collection Time: 09/18/14  1:11 PM  Result Value Ref Range Status   Specimen Description EXPECTORATED SPUTUM  Final   Special Requests NONE  Final   Sputum evaluation   Final    Sputum specimen not acceptable for testing.  Please recollect.     Report Status 09/18/2014 FINAL  Final     Studies: Nm Pet Image Initial (pi) Skull Base To Thigh  09/19/2014   CLINICAL DATA:  Subsequent Treatment strategy for Lung cancer.  EXAM: NUCLEAR MEDICINE PET SKULL BASE TO THIGH  TECHNIQUE: 11.57 mCi F-18 FDG was injected intravenously. Full-ring PET imaging was  performed from the skull base to thigh after the radiotracer. CT data was obtained and used for attenuation correction and anatomic localization.  FASTING BLOOD GLUCOSE:  Value: 92 mg/dl  COMPARISON:  01/15/2014  FINDINGS: NECK  No hypermetabolic lymph nodes in the neck.  CHEST  Right paratracheal lymph node measures 1.1 cm and has an SUV max equal to 3.26. Previously this lymph node measured 0.9 cm and had an SUV max equal to 2.7. Moderate volume left pleural effusion identified. Overlying compressive type atelectasis is noted. Left lower lobe lung nodule is identified posteriorly measuring 1.8 cm. This has an SUV max equal to 3.53. Stable appearance of subpleural nodule within the posterior medial right apex measuring 3.3 cm, image 55/series 3. No significant FDG uptake is within this structure.  ABDOMEN/PELVIS  Small nonspecific focus of increased uptake within the lower rectum/ anus has an SUV max equal to 10.0. This is compared with 5.2 previously. There is no abnormal hyper metabolism within the liver, spleen, adrenal glands, or pancreas. The visualized portions of stress that aortic atherosclerosis noted. No hypermetabolic upper abdominal or pelvic lymph nodes identified.  SKELETON  No focal hypermetabolic activity to suggest skeletal metastasis.  IMPRESSION: 1. Mild increase in size and degree of FDG uptake associated with right paratracheal lymph node. Additionally, there is a new left pleural effusion and a persistent focus of hyper metabolism within the posterior left lung base. Residual tumor cannot be excluded. 2. Stable non hypermetabolic low density, subpleural mass within the posterior medial right apex. 3. Intense focus of increased uptake is identified within the lower rectum/anus area. Correlation with direct visualization would be advised.   Electronically Signed   By: Kerby Moors M.D.   On: 09/19/2014 13:23    Scheduled Meds: . aspirin EC  81 mg Oral Daily  . furosemide  40 mg Oral Daily   . guaiFENesin  600 mg Oral BID  . heparin  5,000 Units Subcutaneous 3 times per day  . hydrALAZINE  50 mg Oral BID  . insulin aspart  0-5 Units Subcutaneous QHS  . insulin aspart  0-9 Units Subcutaneous TID WC  . insulin glargine  18 Units Subcutaneous QHS  . levothyroxine  100 mcg Oral QAC breakfast  . levothyroxine  88 mcg Oral QAC breakfast  . losartan  100 mg Oral Daily  . mometasone-formoterol  2 puff Inhalation BID  . tamoxifen  20 mg Oral Daily  . tiotropium  18 mcg Inhalation Daily  . 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 PET scan showed intense uptake in rectal and anal area- For Flex sig today 3 COPD: Continue SVN's 4 HTN; On Losartan and Hydralazine- Continue to monitor 5 Type 2 DM: On  Lantus insulin and SSI 6 CKD: Se Creat improved to 1.71- Continue to monitor  7 For Rehab soon   Code Status: DNR        Zoi Devine   09/20/2014, 8:19 AM  LOS: 5 days

## 2014-09-20 NOTE — Plan of Care (Signed)
Problem: Discharge Progression Outcomes Goal: Other Discharge Outcomes/Goals Outcome: Progressing Plan of care progress to goals: BP at HS 181/59, hydralazine PO given with improvement. Dyspnea on exertion. Continue 3.5L O2 per [place].  Denies pain.  NPO for Flexible sigmoidoscopy.  CBG's 206 at HS, Lantus 18units given. Heparin held in am per Dr Sabra Heck.

## 2014-09-20 NOTE — Discharge Summary (Addendum)
Physician Discharge Summary  Whitney Wright HLK:[id number] DOB: 22-Aug-1940 DOA: 09/15/2014  PCP: Tracie Harrier, MD  Admit date: 09/15/2014 Discharge date: 09/21/2014  Time spent: 35  minutes  Recommendations for Outpatient Follow-up:  1. Follow up with Dr.Pandit- Oncology 2. Follow up with Dr.Hande   Discharge Diagnoses:     1  Acute on chronic respiratory failure secondary to Fluid overload 2  COPD 3 Ca  Lung- [place] Ca Left lower lobe  3 Acute on Chronic Renal failure 4 HTN 5 Type 2 DM      Discharge Condition: Stable  Diet recommendation: No added salt  Filed Weights   09/20/14 0522 09/20/14 0900 09/21/14 0500  Weight: 70.126 kg (154 lb 9.6 oz) 68.04 kg (150 lb) 71.442 kg (157 lb 8 oz)    History of present illness:  Whitney Wright is a 74 year old female with a known history of COPD chronic respiratory failure on 2 L,Type 2 diabetes, Hypertension Hypothyroidism, CA lung recently been discharged to [facility] skilled nursing facility. Presenting the ED after she became progressively more short of breath also has noticed 7 kg weight gain and swelling in her feet.   Hospital Course:  Patient was admitted [facility] and received intravenous Lasix. Shortness of breath improved leg edema also resolved. Patient was seen in consultation-oncologist Dr. Ma Hillock and underwent a PET scan which showed mild increase in size and degree of FDG uptake associated with right paratracheal lymph node there is also new left pleural effusion and persistent focus of hypermetabolism in the posterior left lung base is intense focus of increased uptake identified within the lower rectum/anus area. Patient underwent a flexible sigmoidoscopy by Dr. Aleen Sells, which revealed a polyp.Pt felt too weak to go to SNF after sigmoidoscopy and was monitored overnight.  She will be transferred in stable condition to skilled nursing facility. She'll follow up with Dr. Ginette Pitman; also  follow up with Dr. Ma Hillock oncologist as an outpatient as scheduled  Procedures: Flex sig   Consultations:  Dr. Ma Hillock- Oncology.  Dr. Aleen Sells- GI  Discharge Exam: Filed Vitals:   09/21/14 0949  BP: 162/58  Pulse: 82  Temp: 98.9 F (37.2 C)  Resp: 20    General: Short of breath with exertion Cardiovascular: S1 S2 Respiratory: Decreased air entry left base posteriorly  Discharge Instructions    Current Discharge Medication List    CONTINUE these medications which have NOT CHANGED   Details  ADVAIR DISKUS 250-50 MCG/DOSE AEPB Inhale 1 puff into the lungs 2 (two) times daily.  Refills: 5    albuterol (PROVENTIL HFA;VENTOLIN HFA) 108 (90 BASE) MCG/ACT inhaler Inhale 2 puffs into the lungs every 6 (six) hours as needed for wheezing or shortness of breath.     ALPRAZolam (XANAX) 0.25 MG tablet Take 0.25 mg by mouth at bedtime as needed.  Refills: 3    Alum & Mag Hydroxide-Simeth (MAG-AL PLUS XS PO) Take 30 mLs by mouth every 4 (four) hours as needed (for gas/ingestion.).    aspirin EC 81 MG tablet Take 81 mg by mouth daily.    calcium carbonate (OS-CAL) 600 MG TABS tablet Take 600 mg by mouth 2 (two) times daily with a meal.    calcium carbonate (TUMS - DOSED IN MG ELEMENTAL CALCIUM) 500 MG chewable tablet Chew 2 tablets by mouth every 4 (four) hours as needed for indigestion.    chlorpheniramine-HYDROcodone (TUSSIONEX PENNKINETIC ER) 10-8 MG/5ML SUER Take 5 mLs by mouth 2 (two) times daily. Qty: 140 mL, Refills: 0  Cholecalciferol 1000 UNITS TBDP Take 1,000 Units by mouth daily.     GLUCERNA (GLUCERNA) LIQD Take 1 Can by mouth 2 (two) times daily between meals.    hydrALAZINE (APRESOLINE) 50 MG tablet Take 1 tablet (50 mg total) by mouth 2 (two) times daily. Qty: 60 tablet, Refills: 3    hydrocortisone cream 1 % Apply 1 application topically 4 (four) times daily as needed (for dry hemorrhoids.).    insulin NPH Human (HUMULIN N,NOVOLIN N) 100 UNIT/ML injection  Inject into the skin 2 (two) times daily. Use as directed twice daily.    insulin regular (NOVOLIN R,HUMULIN R) 100 units/mL injection Inject into the skin as needed for high blood sugar. Use as sliding scale.    ipratropium-albuterol (DUONEB) 0.5-2.5 (3) MG/3ML SOLN Inhale 3 mLs into the lungs See admin instructions. Use 1 vial (46m) via nebulizer every 8 hours, and 1 vial (373m) via nebulizer every 4 hours as needed for wheezing /shortness of breath. Refills: 5    !! levothyroxine (SYNTHROID, LEVOTHROID) 100 MCG tablet Take 100 mcg by mouth daily before breakfast. Take along witg 88 mcg tablet for total dose of 188 mcg.    !! levothyroxine (SYNTHROID, LEVOTHROID) 88 MCG tablet Take 88 mcg by mouth daily before breakfast. Take along with 100 mcg tablet for a total dose of 188 mcg.    losartan (COZAAR) 100 MG tablet Take 1 tablet by mouth daily.    lovastatin (MEVACOR) 20 MG tablet Take 1 tablet by mouth daily.    magnesium hydroxide (MILK OF MAGNESIA) 400 MG/5ML suspension Take 30 mLs by mouth daily as needed for mild constipation or moderate constipation.    ondansetron (ZOFRAN) 4 MG tablet Take 4 mg by mouth every 4 (four) hours as needed for nausea.    oxyCODONE-acetaminophen (PERCOCET/ROXICET) 5-325 MG per tablet Take 1 tablet by mouth every 6 (six) hours as needed (chest pain). Qty: 30 tablet, Refills: 0    tamoxifen (NOLVADEX) 10 MG tablet Take 20 mg by mouth daily.    tiotropium (SPIRIVA) 18 MCG inhalation capsule Place 18 mcg into inhaler and inhale daily.    torsemide (DEMADEX) 20 MG tablet Take 20 mg by mouth daily.    verapamil (CALAN-SR) 240 MG CR tablet Take 240 mg by mouth daily.    levofloxacin (LEVAQUIN) 250 MG tablet Take 1 tablet (250 mg total) by mouth daily. Qty: 14 tablet, Refills: 0    predniSONE (DELTASONE) 10 MG tablet Prednisone 10 mg tabs  Take 4 tabs po once daily for 3 days Take 3 tabs po once daily for 3 days Take 2 tabs po once daily for 3 days Take  1 tab po once daily for 3 days And then stop  Total: 30 tablets Qty: 30 tablet, Refills: 0     !! - Potential duplicate medications found. Please discuss with provider.    STOP taking these medications     amoxicillin-clavulanate (AUGMENTIN) 500-125 MG per tablet        Allergies  Allergen Reactions  . Hydrocodone-Acetaminophen Rash  . Macrodantin [Nitrofurantoin] Rash      The results of significant diagnostics from this hospitalization (including imaging, microbiology, ancillary and laboratory) are listed below for reference.    Significant Diagnostic Studies: X-ray Chest Pa And Lateral  09/16/2014   CLINICAL DATA:  New admission for pneumonia. Chronic obstructive lung disease. History of left upper lobe lung cancer.  EXAM: CHEST  2 VIEW  COMPARISON:  09/15/2014 and previous  FINDINGS: Background pattern of  chronic emphysema. There is more pleural density at the left base than was seen in June. There is patchy radiographic density at the left base consistent with chronic volume loss and pneumonia. No focal finding evident on the right other than emphysema. 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  IMPRESSION: Similar appearance to the study of yesterday. Left effusion and left lower lobe infiltrate since with chronic and acute pneumonia.  Emphysema.  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   Electronically Signed   By: Nelson Chimes M.D.   On: 09/16/2014 07:35   Dg Chest 2 View  08/26/2014   CLINICAL DATA:  Pneumonia. History of COPD, lung cancer and prior radiation.  EXAM: CHEST  2 VIEW  COMPARISON:  08/21/2014  FINDINGS: Consolidation in the left lower lobe and lingula compatible with pneumonia. Mass in the medial right upper hemithorax again noted, unchanged. Heart is upper limits normal in size. No visible effusions or acute bony abnormality.  IMPRESSION: Stable left lower lobe  airspace opacity concerning for pneumonia.  Stable medial right upper chest paraspinal mass.   Electronically Signed   By: Rolm Baptise M.D.   On: 08/26/2014 10:06   Ct Chest Wo Contrast  08/28/2014   CLINICAL DATA:  Limited hospital 1 week prior for pneumonia. History of lung cancer and breast cancer.  EXAM: CT CHEST WITHOUT CONTRAST  TECHNIQUE: Multidetector CT imaging of the chest was performed following the standard protocol without IV contrast.  COMPARISON:  Radiographs 08/26/2014, CT 06/24/2014, PET-CT 01/15/2014  FINDINGS: Mediastinum/Nodes: No axillary or supraclavicular lymphadenopathy. No mediastinal hilar adenopathy. No pericardial fluid esophagus is normal  Lungs/Pleura: Extensive centrilobular emphysema in the upper lobes. There is peripheral reticulation and peribronchial thickening in the lingula. There is bandlike consolidation within the lateral left lower lobe with some of more focal consolidation posterior left lower lobe. (Image 46, series 3 and image 33, series 3).  There is a fine branching nodular pattern posterior right lung base on image 39, series 3. These lower lobe findings are similar to CT exam of 06/24/2014.  There is a rounded pleural-based mass in the superior medial right hemi thorax measuring 31 x 19 mm. This lesion has simple fluid attenuation and was not significantly metabolic on comparison PET-CT scan. The lesion is unchanged in size from the PET-CT exam.  Upper abdomen: Limited view of the liver, kidneys, pancreas are unremarkable. Normal adrenal glands.  Musculoskeletal: No aggressive osseous lesion.  IMPRESSION: 1. Chronic nodular and reticular nodular airspace opacities and interstitial opacities in the left lower lobe and lingula suggests chronic infection. No significant change from CT of 06/24/2014. 2. Mild branching nodular airspace disease in the right lower lobe is new. This could represent early or resolving pneumonia. 3. 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   Electronically Signed   By: Suzy Bouchard M.D.   On: 08/28/2014 16:49   Nm Pet Image Initial (pi) Skull Base To Thigh  09/19/2014   CLINICAL DATA:  Subsequent Treatment strategy for Lung cancer.  EXAM: NUCLEAR MEDICINE PET SKULL BASE TO THIGH  TECHNIQUE: 11.57 mCi F-18 FDG was injected intravenously. Full-ring PET imaging was performed from the skull base to thigh after the radiotracer. CT data was obtained and used for attenuation correction and anatomic localization.  FASTING BLOOD GLUCOSE:  Value: 92 mg/dl  COMPARISON:  01/15/2014  FINDINGS:  NECK  No hypermetabolic lymph nodes in the neck.  CHEST  Right paratracheal lymph node measures 1.1 cm and has an SUV max equal to 3.26. Previously this lymph node measured 0.9 cm and had an SUV max equal to 2.7. Moderate volume left pleural effusion identified. Overlying compressive type atelectasis is noted. Left lower lobe lung nodule is identified posteriorly measuring 1.8 cm. This has an SUV max equal to 3.53. Stable appearance of subpleural nodule within the posterior medial right apex measuring 3.3 cm, image 55/series 3. No significant FDG uptake is within this structure.  ABDOMEN/PELVIS  Small nonspecific focus of increased uptake within the lower rectum/ anus has an SUV max equal to 10.0. This is compared with 5.2 previously. There is no abnormal hyper metabolism within the liver, spleen, adrenal glands, or pancreas. The visualized portions of stress that aortic atherosclerosis noted. No hypermetabolic upper abdominal or pelvic lymph nodes identified.  SKELETON  No focal hypermetabolic activity to suggest skeletal metastasis.  IMPRESSION: 1. Mild increase in size and degree of FDG uptake associated with right paratracheal lymph node. Additionally, there is a new left pleural effusion and a persistent focus of hyper metabolism within the posterior left lung base. Residual tumor cannot be  excluded. 2. Stable non hypermetabolic low density, subpleural mass within the posterior medial right apex. 3. Intense focus of increased uptake is identified within the lower rectum/anus area. Correlation with direct visualization would be advised.   Electronically Signed   By: Kerby Moors M.D.   On: 09/19/2014 13:23   Dg Chest Portable 1 View  09/15/2014   CLINICAL DATA:  Shortness of breath, productive cough  EXAM: PORTABLE CHEST - 1 VIEW  COMPARISON:  CT chest dated 08/28/2014  FINDINGS: Patchy lingular/left lower lobe opacity, some of which is likely chronic. Superimposed infection is not excluded.  Small left pleural effusion.  No pneumothorax.  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  Cardiomegaly.  IMPRESSION: Patchy lingular/ left lower lobe opacity, some of which is likely chronic, superimposed infection not excluded.  Small left pleural effusion.  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   Electronically Signed   By: Julian Hy M.D.   On: 09/15/2014 17:53    Microbiology: Recent Results (from the past 240 hour(s))  Culture, blood (routine x 2)     Status: None   Collection Time: 09/15/14  5:44 PM  Result Value Ref Range Status   Specimen Description BLOOD RIGHT ASSIST CONTROL  Final   Special Requests   Final    BOTTLES DRAWN AEROBIC AND ANAEROBIC  AER 6CC ANA [place]   Culture NO GROWTH 5 DAYS  Final   Report Status 09/20/2014 FINAL  Final  Culture, blood (routine x 2)     Status: None   Collection Time: 09/15/14  5:47 PM  Result Value Ref Range Status   Specimen Description BLOOD RIGHT FATTY CASTS  Final   Special Requests   Final    BOTTLES DRAWN AEROBIC AND ANAEROBIC  AER 5CC ANA [place]   Culture NO GROWTH 5 DAYS  Final   Report Status 09/20/2014 FINAL  Final  Culture, expectorated sputum-assessment      Status: None   Collection Time: 09/18/14  1:11 PM  Result Value Ref Range Status   Specimen Description EXPECTORATED SPUTUM  Final   Special Requests NONE  Final   Sputum evaluation   Final    Sputum specimen not acceptable for testing.  Please recollect.     Report Status 09/18/2014 FINAL  Final     Labs: Basic Metabolic Panel:  Recent Labs Lab 09/16/14 0440 09/17/14 0429 09/18/14 0437 09/19/14 0500 09/20/14 0442  NA 134* 137 140 141 141  K 4.0 3.7 3.8 3.9 3.9  CL 95* 95* 101 104 106  CO2 '30 29 31 29 28  '$ GLUCOSE 297* 287* 162* 171* 140*  BUN 87* 87* 78* 62* 46*  CREATININE 2.48* 2.36* 2.13* 1.99* 1.71*  CALCIUM 8.1* 8.0* 7.9* 8.0* 7.9*   Liver Function Tests:  Recent Labs Lab 09/15/14 1744 09/17/14 0429  AST 20 18  ALT 26 23  ALKPHOS 69 62  BILITOT 0.3 0.3  PROT 5.5* 4.9*  ALBUMIN 2.1* 2.0*   No results for input(s): LIPASE, AMYLASE in the last 168 hours. No results for input(s): AMMONIA in the last 168 hours. CBC:  Recent Labs Lab 09/16/14 0440 09/17/14 0429 09/18/14 0437 09/19/14 0500 09/20/14 0442  WBC 7.4 9.0 6.2 6.7 9.1  NEUTROABS  --  7.6* 5.1 5.5 7.5*  HGB 8.9* 8.8* 8.7* 9.0* 9.5*  HCT 26.8* 27.2* 26.9* 27.6* 29.0*  MCV 82.0 83.5 83.3 83.7 82.5  PLT 148* 183 183 181 184   Cardiac Enzymes:  Recent Labs Lab 09/15/14 1744  TROPONINI <0.03   BNP: BNP (last 3 results)  Recent Labs  08/17/14 1006 09/15/14 1744  BNP 390.0* 558.0*    ProBNP (last 3 results) No results for input(s): PROBNP in the last 8760 hours.  CBG:  Recent Labs Lab 09/20/14 0730 09/20/14 1103 09/20/14 1616 09/20/14 2212 09/21/14 0656  GLUCAP 140* 221* 127* 229* 95       Signed:  Pape Parson J Makesha Belitz III   09/21/2014, 11:24 AM

## 2014-09-20 NOTE — Clinical Social Work Note (Signed)
Clinical Social Worker is continuing to follow for discharge planning needs. Per MD note, pt will be ready for "rehab soon." CSW updated facility. CSW will continue to follow.   Darden Dates, MSW, LCSW Clinical Social Worker  [phone]

## 2014-09-20 NOTE — Progress Notes (Signed)
Initial Nutrition Assessment       INTERVENTION:   Meals and snacks: Cater to pt preferences within diet restrictions  Nutrition diet education: Pt familiar with low sodium diet restrictions.  No further information needed.   NUTRITION DIAGNOSIS:    (None at this time) related to   as evidenced by  .    GOAL:   Patient will meet greater than or equal to 90% of their needs    MONITOR:    (Energy intake, Digestive system)  REASON FOR ASSESSMENT:   LOS    ASSESSMENT:   Pt admitted with shortness of breath, acute on chronic respiratory failure, volume overload, wt gain of 14 pounds prior to admission.  Pt s/p flex sig this am from recent PET scan results  Past Medical History  Diagnosis Date  . Hypertension   . Cellulitis and abscess of trunk   . Thyroid disease     hypo  . Arthritis   . Personal history of malignant neoplasm of breast   . Diabetes mellitus without complication   . Senile osteoporosis   . 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 ER 50%, PR 0, HER-2/neu nonamplified.. Tamoxifen chosen as bone density showed severe osteoporosis.  . Lung cancer, upper lobe February 2015    Adenocarcinoma with lepidic pattern, second primary fall 2015  . Breast cancer   . COPD (chronic obstructive pulmonary disease)   . Shortness of breath dyspnea   . Chronic kidney disease     Current Nutrition: ate cereal this am following flex sig. Reports eating 50% or more during admission  Food/Nutrition-Related History: reports good/normal appetite prior to admission   Medications: lasix, aspart, lantus  Electrolyte/Renal Profile and Glucose Profile:   Recent Labs Lab 09/18/14 0437 09/19/14 0500 09/20/14 0442  NA 140 141 141  K 3.8 3.9 3.9  CL 101 104 106  CO2 31 29 28   BUN 78* 62* 46*  CREATININE 2.13* 1.99* 1.71*  CALCIUM 7.9* 8.0* 7.9*   GLUCOSE 162* 171* 140*   Protein Profile:  Recent Labs Lab 09/15/14 1744 09/17/14 0429  ALBUMIN 2.1* 2.0*     Last BM: 7/22    Weight Change: wt gain (fluid)    Diet Order:  Diet Heart Room service appropriate?: Yes; Fluid consistency:: Thin  Skin:   reviewed   Height:   Ht Readings from Last 1 Encounters:  09/20/14 5' (1.524 m)    Weight:   Wt Readings from Last 1 Encounters:  09/20/14 150 lb (68.04 kg)     Wt Readings from Last 10 Encounters:  09/20/14 150 lb (68.04 kg)  08/21/14 155 lb (70.308 kg)  08/17/14 155 lb (70.308 kg)  03/12/14 150 lb (68.04 kg)  03/15/13 154 lb (69.854 kg)  08/28/12 150 lb (68.04 kg)    BMI:  Body mass index is 29.3 kg/(m^2).   EDUCATION NEEDS:   No education needs identified at this time  LOW Care Level  Symone Cornman B. AZenia Resides [place](pager)

## 2014-09-21 LAB — GLUCOSE, CAPILLARY
GLUCOSE-CAPILLARY: 95 mg/dL (ref 65–99)
Glucose-Capillary: 329 mg/dL — ABNORMAL HIGH (ref 65–99)

## 2014-09-21 NOTE — Plan of Care (Signed)
Problem: Discharge Progression Outcomes Goal: Other Discharge Outcomes/Goals Plan of care progress to goal: Pain: denies pain for me VSS Activity: stand by assist Planning discharge to SNF, possibly today

## 2014-09-21 NOTE — Progress Notes (Signed)
[facility] Hematology/Oncology Progress Note  Date of admission: 09/15/2014  Hospital day:  09/20/2014  Chief Complaint: Whitney Wright is a 74 y.o. female with recurrent non-small cell lung cancer who was admitted with acute on chronic respiratory failure in combination with anasarca.  Subjective: Patient feels better today.  She states that she is scheduled to be discharged back to [facility] in the next day or so.  Her nephew is concerned that she may deteriorate again given her recent readmission.  Social History: The patient is alone today.  Prior to admission she had been staying at [facility] for approximately 2 weeks prior to her admission.  I spoke with the patient's nephew, Whitney Wright, phone # [phone], after speaking with the patient.  He is the medical power of attorney.  Allergies:  Allergies  Allergen Reactions  . Hydrocodone-Acetaminophen Rash  . Macrodantin [Nitrofurantoin] Rash    Scheduled Medications: . aspirin EC  81 mg Oral Daily  . furosemide  40 mg Oral Daily  . guaiFENesin  600 mg Oral BID  . heparin  5,000 Units Subcutaneous 3 times per day  . hydrALAZINE  50 mg Oral BID  . insulin aspart  0-5 Units Subcutaneous QHS  . insulin aspart  0-9 Units Subcutaneous TID WC  . insulin glargine  18 Units Subcutaneous QHS  . levothyroxine  100 mcg Oral QAC breakfast  . levothyroxine  88 mcg Oral QAC breakfast  . losartan  100 mg Oral Daily  . mometasone-formoterol  2 puff Inhalation BID  . tamoxifen  20 mg Oral Daily  . tiotropium  18 mcg Inhalation Daily  . verapamil  240 mg Oral Daily    Review of Systems: GENERAL:  Weak.  No fevers, sweats or weight loss. PERFORMANCE STATUS (ECOG):  3 HEENT:  No visual changes, runny nose, sore throat, mouth sores or tenderness. Lungs: Shortness of breath.  Chronic C\cough.  No hemoptysis. Cardiac:  No cardiac chest pain, palpitations, orthopnea, or PND. GI:  Flex sig performed today.  No nausea,  vomiting, diarrhea, constipation, melena or hematochezia. GU:  No urgency, frequency, dysuria, or hematuria. Musculoskeletal:  No back pain.  No joint pain.  No muscle tenderness. Extremities:  Lower extremity swelling, improving. Skin:  No rashes or skin changes. Neuro:  No headache, numbness or weakness, balance or coordination issues. Endocrine:  No diabetes, thyroid issues, hot flashes or night sweats. Psych:  No mood changes, depression or anxiety. Review of systems:  All other systems reviewed and found to be negative.  Physical Exam: Blood pressure 154/44, pulse 84, temperature 99 F (37.2 C), temperature source Oral, resp. rate 18, height 5' (1.524 m), weight 157 lb 8 oz (71.442 kg), SpO2 94 %.  GENERAL:  Chronically ill appearing woman sitting comfortably on the medical unit in no acute distress. MENTAL STATUS:  Alert and oriented to person, place and time. HEAD:  Short blonde graying  hair.  Normocephalic, atraumatic, face symmetric, no Cushingoid features. EYES:  Blue eyes.  Pupils equal round and reactive to light and accomodation.  No conjunctivitis or scleral icterus. ENT:  [place] in place.  Oropharynx clear without lesion.  Tongue normal. Mucous membranes moist.  RESPIRATORY:  Decreased respiratory excursion.  Scattered rhonchi.  Slight decreased breath sounds in left base. CARDIOVASCULAR:  Regular rate and rhythm without murmur, rub or gallop. ABDOMEN:  Soft, non-tender, with active bowel sounds, and no hepatosplenomegaly.  No masses. SKIN:  No rashes, ulcers or lesions. EXTREMITIES: 1+ bilateral lower extremity  edema (right > left).  No skin discoloration or tenderness.  No palpable cords. LYMPH NODES: No palpable cervical, supraclavicular, axillary or inguinal adenopathy  NEUROLOGICAL: Unremarkable. PSYCH:  Appropriate.  Results for orders placed or performed during the hospital encounter of 09/15/14 (from the past 48 hour(s))  Glucose, capillary     Status: None    Collection Time: 09/19/14 10:05 AM  Result Value Ref Range   Glucose-Capillary 92 65 - 99 mg/dL  Glucose, capillary     Status: None   Collection Time: 09/19/14 12:46 PM  Result Value Ref Range   Glucose-Capillary 84 65 - 99 mg/dL  Glucose, capillary     Status: Abnormal   Collection Time: 09/19/14  4:19 PM  Result Value Ref Range   Glucose-Capillary 131 (H) 65 - 99 mg/dL  Glucose, capillary     Status: Abnormal   Collection Time: 09/19/14  9:56 PM  Result Value Ref Range   Glucose-Capillary 243 (H) 65 - 99 mg/dL   Comment 1 Notify RN   Glucose, capillary     Status: Abnormal   Collection Time: 09/19/14 11:21 PM  Result Value Ref Range   Glucose-Capillary 206 (H) 65 - 99 mg/dL   Comment 1 Notify RN   CBC with Differential/Platelet     Status: Abnormal   Collection Time: 09/20/14  4:42 AM  Result Value Ref Range   WBC 9.1 3.6 - 11.0 K/uL   RBC 3.52 (L) 3.80 - 5.20 MIL/uL   Hemoglobin 9.5 (L) 12.0 - 16.0 g/dL   HCT 29.0 (L) 35.0 - 47.0 %   MCV 82.5 80.0 - 100.0 fL   MCH 27.0 26.0 - 34.0 pg   MCHC 32.7 32.0 - 36.0 g/dL   RDW 15.4 (H) 11.5 - 14.5 %   Platelets 184 150 - 440 K/uL   Neutrophils Relative % 83 %   Neutro Abs 7.5 (H) 1.4 - 6.5 K/uL   Lymphocytes Relative 8 %   Lymphs Abs 0.7 (L) 1.0 - 3.6 K/uL   Monocytes Relative 6 %   Monocytes Absolute 0.6 0.2 - 0.9 K/uL   Eosinophils Relative 2 %   Eosinophils Absolute 0.2 0 - 0.7 K/uL   Basophils Relative 1 %   Basophils Absolute 0.0 0 - 0.1 K/uL  Basic metabolic panel     Status: Abnormal   Collection Time: 09/20/14  4:42 AM  Result Value Ref Range   Sodium 141 135 - 145 mmol/L   Potassium 3.9 3.5 - 5.1 mmol/L   Chloride 106 101 - 111 mmol/L   CO2 28 22 - 32 mmol/L   Glucose, Bld 140 (H) 65 - 99 mg/dL   BUN 46 (H) 6 - 20 mg/dL   Creatinine, Ser 1.71 (H) 0.44 - 1.00 mg/dL   Calcium 7.9 (L) 8.9 - 10.3 mg/dL   GFR calc non Af Amer 28 (L) >60 mL/min   GFR calc Af Amer 32 (L) >60 mL/min    Comment: (NOTE) The eGFR has  been calculated using the CKD EPI equation. This calculation has not been validated in all clinical situations. eGFR's persistently <60 mL/min signify possible Chronic Kidney Disease.    Anion gap 7 5 - 15  Glucose, capillary     Status: Abnormal   Collection Time: 09/20/14  7:30 AM  Result Value Ref Range   Glucose-Capillary 140 (H) 65 - 99 mg/dL  Glucose, capillary     Status: Abnormal   Collection Time: 09/20/14 11:03 AM  Result Value Ref Range  Glucose-Capillary 221 (H) 65 - 99 mg/dL  Glucose, capillary     Status: Abnormal   Collection Time: 09/20/14  4:16 PM  Result Value Ref Range   Glucose-Capillary 127 (H) 65 - 99 mg/dL   Comment 1 Notify RN   Glucose, capillary     Status: Abnormal   Collection Time: 09/20/14 10:12 PM  Result Value Ref Range   Glucose-Capillary 229 (H) 65 - 99 mg/dL  Glucose, capillary     Status: None   Collection Time: 09/21/14  6:56 AM  Result Value Ref Range   Glucose-Capillary 95 65 - 99 mg/dL   Nm Pet Image Initial (pi) Skull Base To Thigh  09/19/2014   CLINICAL DATA:  Subsequent Treatment strategy for Lung cancer.  EXAM: NUCLEAR MEDICINE PET SKULL BASE TO THIGH  TECHNIQUE: 11.57 mCi F-18 FDG was injected intravenously. Full-ring PET imaging was performed from the skull base to thigh after the radiotracer. CT data was obtained and used for attenuation correction and anatomic localization.  FASTING BLOOD GLUCOSE:  Value: 92 mg/dl  COMPARISON:  01/15/2014  FINDINGS: NECK  No hypermetabolic lymph nodes in the neck.  CHEST  Right paratracheal lymph node measures 1.1 cm and has an SUV max equal to 3.26. Previously this lymph node measured 0.9 cm and had an SUV max equal to 2.7. Moderate volume left pleural effusion identified. Overlying compressive type atelectasis is noted. Left lower lobe lung nodule is identified posteriorly measuring 1.8 cm. This has an SUV max equal to 3.53. Stable appearance of subpleural nodule within the posterior medial right apex  measuring 3.3 cm, image 55/series 3. No significant FDG uptake is within this structure.  ABDOMEN/PELVIS  Small nonspecific focus of increased uptake within the lower rectum/ anus has an SUV max equal to 10.0. This is compared with 5.2 previously. There is no abnormal hyper metabolism within the liver, spleen, adrenal glands, or pancreas. The visualized portions of stress that aortic atherosclerosis noted. No hypermetabolic upper abdominal or pelvic lymph nodes identified.  SKELETON  No focal hypermetabolic activity to suggest skeletal metastasis.  IMPRESSION: 1. Mild increase in size and degree of FDG uptake associated with right paratracheal lymph node. Additionally, there is a new left pleural effusion and a persistent focus of hyper metabolism within the posterior left lung base. Residual tumor cannot be excluded. 2. Stable non hypermetabolic low density, subpleural mass within the posterior medial right apex. 3. Intense focus of increased uptake is identified within the lower rectum/anus area. Correlation with direct visualization would be advised.   Electronically Signed   By: Kerby Moors M.D.   On: 09/19/2014 13:23    Assessment:  Whitney Wright is a 74 y.o. female with a history of adenocarcinoma of the lung diagnosed 03/2013.  She completed stereotactic radiation on 04/2013.  PET scan on 01/15/2014 revealed progressive enlargement of the left lower lobe nodule.  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  She received stereotactic radiation to a second lung location (completed 03/07/2014).  She did not receive chemotherapy secondary to borderline performance status and her reluctance to take chemotherapy.    Chest CT scan on 06/24/2014 was stable.   Chest CT on 08/17/2014 revealed slightly increased consolidation in the left lower lobe.    PET scan on 09/19/2014 revealed possible residual tumor on the left.  There is a new left sided effusion.  There was intense uptake in  the lower rectum/anal area.  Flexible sigmoidoscopy was performed today.  By patient's report, a  polyp was removed.  Plan: 1. Hematology/Oncology:  PET scan does not rule in or rule out active tumor.  Discussed with patient possible consideration of diagnostic thoracentesis.  Risks and benefits of the procedure discussed.  On exam, the amount of fluid does not appear that significant and thus does not necessitate a therapeutic thoracentesis.  However, if fluid would increase and cause increased shortness of breath, would perform a therapeutic and diagnostic thoracentesis at that time.  If effusion is malignant, would make disease stage IV.  Discussed with patient follow-up with Dr. Ma Hillock in clinic to continue discussions about this procedure.  If patient has stage IV disease, she would be a candidate for Hospice. 2. Disposition:  Anticipate discharge to skilled nursing facility soon.  Patient's nephew very concerned about return to [place].  Thank you for allowing me to participate in Ms Eskin's care.   Lequita Asal, MD

## 2014-09-21 NOTE — Progress Notes (Signed)
Patient ID: Whitney Wright, female   DOB: [birth year], 74 y.o.   MRN: [id number]   PROGRESS NOTE  Stephania Macfarlane WUJ:[id number] DOB: May 12, 1940 DOA: 09/15/2014 PCP: Tracie Harrier, MD  Subjective: 74 y/o f with hx of COPD, Chronic Respiratory Failure, Ca Lung, Type 2 DM, HTN , recently discharged from [facility]- readmitted with increasing shortness of breath and Hypoxemia. ECHO: EF 55 to 60% PET scan showed intense focus in lower rectum and anal area Underwent Flex sig, with polyp found. Feels reasonably well today; breathing back to close to what she considers her baseline.  Discussed tfr to SNF, and she is comfortable proceeding with this. Readdressed code status, and she confirms her wish to be DNR.    Consultants: Dr. Ma Hillock- Oncology Dr. Aleen Sells- GI   Objective: BP 162/58 mmHg  Pulse 82  Temp(Src) 98.9 F (37.2 C) (Oral)  Resp 20  Ht 5' (1.524 m)  Wt 71.442 kg (157 lb 8 oz)  BMI 30.76 kg/m2  SpO2 96%  Intake/Output Summary (Last 24 hours) at 09/21/14 1139 Last data filed at 09/21/14 0906  Gross per 24 hour  Intake    480 ml  Output    800 ml  Net   -320 ml   Filed Weights   09/20/14 0522 09/20/14 0900 09/21/14 0500  Weight: 70.126 kg (154 lb 9.6 oz) 68.04 kg (150 lb) 71.442 kg (157 lb 8 oz)    Exam:  HEENT: [place], AT  General:  NAD  Cardiovascular: S1 S2   Respiratory: Rhonchi + Bilat  Decreased air enetry left base   Abdomen: Soft . Non tender  Neuro:Non Focal   Data Reviewed: Basic Metabolic Panel:  Recent Labs Lab 09/16/14 0440 09/17/14 0429 09/18/14 0437 09/19/14 0500 09/20/14 0442  NA 134* 137 140 141 141  K 4.0 3.7 3.8 3.9 3.9  CL 95* 95* 101 104 106  CO2 '30 29 31 29 28  '$ GLUCOSE 297* 287* 162* 171* 140*  BUN 87* 87* 78* 62* 46*  CREATININE 2.48* 2.36* 2.13* 1.99* 1.71*  CALCIUM 8.1* 8.0* 7.9* 8.0* 7.9*   Liver Function Tests:  Recent Labs Lab 09/15/14 1744 09/17/14 0429  AST 20 18  ALT 26 23  ALKPHOS 69 62   BILITOT 0.3 0.3  PROT 5.5* 4.9*  ALBUMIN 2.1* 2.0*   No results for input(s): LIPASE, AMYLASE in the last 168 hours. No results for input(s): AMMONIA in the last 168 hours. CBC:  Recent Labs Lab 09/16/14 0440 09/17/14 0429 09/18/14 0437 09/19/14 0500 09/20/14 0442  WBC 7.4 9.0 6.2 6.7 9.1  NEUTROABS  --  7.6* 5.1 5.5 7.5*  HGB 8.9* 8.8* 8.7* 9.0* 9.5*  HCT 26.8* 27.2* 26.9* 27.6* 29.0*  MCV 82.0 83.5 83.3 83.7 82.5  PLT 148* 183 183 181 184   Cardiac Enzymes:    Recent Labs Lab 09/15/14 1744  TROPONINI <0.03   BNP (last 3 results)  Recent Labs  08/17/14 1006 09/15/14 1744  BNP 390.0* 558.0*    ProBNP (last 3 results) No results for input(s): PROBNP in the last 8760 hours.  CBG:  Recent Labs Lab 09/20/14 1103 09/20/14 1616 09/20/14 2212 09/21/14 0656 09/21/14 1114  GLUCAP 221* 127* 229* 95 329*    Recent Results (from the past 240 hour(s))  Culture, blood (routine x 2)     Status: None   Collection Time: 09/15/14  5:44 PM  Result Value Ref Range Status   Specimen Description BLOOD RIGHT ASSIST CONTROL  Final   Special Requests  Final    BOTTLES DRAWN AEROBIC AND ANAEROBIC  AER 6CC ANA 8CC   Culture NO GROWTH 5 DAYS  Final   Report Status 09/20/2014 FINAL  Final  Culture, blood (routine x 2)     Status: None   Collection Time: 09/15/14  5:47 PM  Result Value Ref Range Status   Specimen Description BLOOD RIGHT FATTY CASTS  Final   Special Requests   Final    BOTTLES DRAWN AEROBIC AND ANAEROBIC  AER 5CC ANA [place]   Culture NO GROWTH 5 DAYS  Final   Report Status 09/20/2014 FINAL  Final  Culture, expectorated sputum-assessment     Status: None   Collection Time: 09/18/14  1:11 PM  Result Value Ref Range Status   Specimen Description EXPECTORATED SPUTUM  Final   Special Requests NONE  Final   Sputum evaluation   Final    Sputum specimen not acceptable for testing.  Please recollect.     Report Status 09/18/2014 FINAL  Final     Studies: No  results found.  Scheduled Meds: . aspirin EC  81 mg Oral Daily  . furosemide  40 mg Oral Daily  . guaiFENesin  600 mg Oral BID  . heparin  5,000 Units Subcutaneous 3 times per day  . hydrALAZINE  50 mg Oral BID  . insulin aspart  0-5 Units Subcutaneous QHS  . insulin aspart  0-9 Units Subcutaneous TID WC  . insulin glargine  18 Units Subcutaneous QHS  . levothyroxine  100 mcg Oral QAC breakfast  . levothyroxine  88 mcg Oral QAC breakfast  . losartan  100 mg Oral Daily  . mometasone-formoterol  2 puff Inhalation BID  . tamoxifen  20 mg Oral Daily  . tiotropium  18 mcg Inhalation Daily  . verapamil  240 mg Oral Daily    Continuous Infusions:   Assessment/Plan: 1  Acute on chronic respiratory failure secondary to Volume overload: Improved on current treatment; to SNF today. 2 Recurrent Non Small cell  Lung Ca: cont pain medication PRN; f/u with oncology. 3 COPD: Continue inhaled medications 4 HTN; reasonable control on Losartan and Hydralazine; further adjustments as per SNF MD 5 Type 2 DM: continue Lantus insulin and SSI PRN 6 CKD stage 3: creatinine trending downward; will need to be followed at SNF  D/c orders and summary updated.  Code Status: DNR        Jetson Pickrel J Sally Menard III   09/21/2014, 11:39 AM  LOS: 6 days

## 2014-09-21 NOTE — Discharge Instructions (Signed)
Acute Respiratory Distress Syndrome  Acute respiratory distress syndrome (ARDS) is a serious, life-threatening lung condition that can cause breathing failure. It occurs in people who are critically ill or in people who have had a serious injury.  CAUSES  ARDS occurs when small blood vessels in the lungs leak fluid into the air sacs (alveoli) of the lungs. The fluid causes the lungs to become "stiff" and decreases their ability to inflate. The fluid also prevents oxygen from being absorbed into the bloodstream. When the bloodstream does not have enough oxygen, the body's vital organs do not get enough oxygen to function properly. ARDS can occur in the following conditions:  Sepsis. This is a serious bloodstream infection.  Serious injury (trauma) to the head or chest.  Pneumonia.  After major surgery, such as a lung transplant.  Drug overdose.  Breathing (inhalation) of harmful chemicals. SYMPTOMS  ARDS comes on quickly (rapid onset) and can occur within 24 to 48 hours of an infection, illness, surgery, or injury. Symptoms include:  Shortness of breath or difficulty breathing.  Cyanosis. This is a bluish color to the skin or nail beds (due to low oxygen levels in the blood).  Fast or irregular heart rate.  Low blood pressure (hypotension).  Organ failure. DIAGNOSIS  There is not a specific test to diagnose ARDS. It is usually diagnosed when other diseases and conditions that cause similar symptoms have been ruled out. When a person is thought to have ARDS, the following tests may be performed:  A chest X-ray or computed tomography (CT) scan to look at the lungs.  Arterial blood gas (ABG) analysis. This test looks at the oxygen level in the blood.  Blood tests to rule out infection.  Sputum culture to rule out a lung infection.  Bronchoscopy. TREATMENT  ARDS is a critical condition. People who develop ARDS need to be in a hospital intensive care unit (ICU). Treatment of ARDS  includes:  Providing oxygenation. This is a main treatment goal of ARDS. A breathing machine (ventilator) is often used to help a person breathe and to provide oxygen. When on a breathing machine, medicine is given to keep patients asleep (sedated).  Treatment of the underlying cause of ARDS (infection, illness, or trauma).  Supportive treatment such as:  Intravenous (IV) fluids.  Liquid nutrition that goes through an IV or feeding tube.  Blood pressure medicine to support low blood pressure.  Antibiotic medicine to help fight infection.  Steroid medicine to help decrease swelling (inflammation) in the lungs.  Diuretic medicine to get rid of extra fluid in the body. HOME CARE INSTRUCTIONS  After recovering from ARDS, you may have weakness, shortness of breath, or memory problems. You may also suffer from depression or from complications of the illness that caused ARDS. You can do several things to help your recovery:  Do not smoke.  If you drink alcohol, limit the amount of alcohol you drink to 1 or 2 drinks a day.  Be sure you get a yearly flu (influenza) shot. You should get a pneumonia vaccine once every 5 years.  Ask your caregiver about lung rehabilitation programs.  Ask your caregiver about local support groups for people with breathing problems.  Ask friends and family to help you if daily activities make you tired. SEEK MEDICAL CARE IF:   You become short of breath with activity or while at rest.  You develop a cough that does not go away. SEEK IMMEDIATE MEDICAL CARE IF:   You have sudden  shortness of breath with or without chest pain.  You have chest pain that does not go away.  You develop swelling or pain in one of your legs.  You have trauma to your chest or any other part of your body.  You have a fever.  You overdose or have a reaction to your medicine. MAKE SURE YOU:   Understand these instructions.  Will watch your condition.  Will get help  right away if you are not doing well or get worse. Document Released: 02/15/2005 Document Revised: 07/02/2013 Document Reviewed: 11/04/2010 [facility] Patient Information 2015 [place], [place]. This information is not intended to replace advice given to you by your health care provider. Make sure you discuss any questions you have with your health care provider.  Chronic Respiratory Failure Respiratory failure is when your lungs are not working well and your breathing (respiratory) system fails. When respiratory failure occurs, it is difficult for your lungs to get enough oxygen or get rid of carbon dioxide or both. Respiratory failure can be life threatening.  Respiratory failure can be acute or chronic. Acute respiratory failure is sudden, severe, and requires emergency medical treatment. Chronic respiratory failure is less severe, happens over time, and requires ongoing treatment.  CAUSES  Any problem affecting the heart or lungs can cause respiratory failure. Some of these causes may be:  Chronic bronchitis and emphysema (COPD).  Blood clot going to the lung (pulmonary embolism).  Having water in the lungs caused by heart failure, lung injury, or infection (pulmonary edema).  Collapsed lung (pneumothorax).  Pneumonia.  Pulmonary fibrosis.  Obesity.  Asthma.  Heart failure.  Any type of trauma to the chest that can make breathing difficult.  Nerve or muscle diseases making chest movements difficult. SYMPTOMS  Signs and symptoms of chronic respiratory failure include:  Shortness of breath (dyspnea) with or without activity.  Rapid, fast breathing (tachypnea).  Wheezing.  Fast heart rate.  Bluish color to the fingernail or toenail beds.  Confusion or drowsiness or both. DIAGNOSIS  Initial diagnosis requires a thorough history and a physical exam by your health care provider. Additional tests may include:  Chest X-ray.  CT scan of your lungs.  Ultrasound to check for  blood clots.  Blood tests, such as an arterial blood gas test (ABG). This is a blood test that looks at the oxygen and carbon dioxide levels in your arterial blood.  Your vital signs will be taken. This includes your respiratory rate (how many times a minute you are breathing), oxygen saturation (this measures the oxygen level in your blood), heart rate, and blood pressure. These numbers help your health care provider determine the next steps.  Electrocardiogram. TREATMENT  Treatment of chronic respiratory failure depends on the cause of the respiratory failure. Treatment can include the following:  Oxygen. Oxygen can be delivered through the following:  Nasal cannula. This is small tubing that goes in your nose to give you oxygen.  Face mask. A face mask covers your nose and mouth to give you oxygen.  Medicine. Different medicines can be given to help with breathing. These can include:  Nebulizers. Nebulizers deliver medicines to open the air passages (bronchodilators). These medicines help to open or relax the airways in the lungs so you can breathe better. They can also help loosen mucus from your lungs.  Diuretics. Diuretic medicines can help you breathe better by getting rid of extra fluid in your body.  Steroids. Steroid medicines can help decrease inflammation in your lungs.  Chest tube. If you have a collapsed lung (pneumothorax), a chest tube is placed to help reinflate the lung.  Non-invasive positive pressure ventilation (NPPV). This is a tight-fitting mask that goes over your nose and mouth. The mask has tubing that is attached to a machine. The machine blows air into the tubing, which helps to keep the tiny air sacs (alveoli) in your lungs open. This machine allows you to breathe on your own.  Ventilator. A ventilator is a breathing machine. When on a ventilator, a breathing tube is put into the lungs. A ventilator is used when you can no longer breathe well enough on your  own. You may have low oxygen levels or high carbon dioxide (CO2) levels in your blood. When you are on a ventilator, sedation and pain medicines are given to make you sleep so your lungs can heal. HOME CARE INSTRUCTIONS  Follow your health care provider's directions about medicines and respiratory therapy.  Quit smoking if you smoke. SEEK MEDICAL CARE IF:  You have increasing shortness of breath and are less functional than you have been.  You have increased sputum, wheezing, coughing, or loss of energy.  You are on oxygen and are requiring more. SEEK IMMEDIATE MEDICAL CARE IF:  Your shortness of breath is significantly worse.  You are unable to say more than a few words without having to catch your breath.  You are much less functional. MAKE SURE YOU:  Understand these instructions.  Will watch your condition.  Will get help right away if you are not doing well or get worse. Document Released: 02/15/2005 Document Revised: 07/02/2013 Document Reviewed: 12/14/2012 [facility] Patient Information 2015 [place], [place]. This information is not intended to replace advice given to you by your health care provider. Make sure you discuss any questions you have with your health care provider.  Hypoxemia Hypoxemia occurs when your blood does not contain enough oxygen. The body cannot work well when it does not have enough oxygen because every part of your body needs oxygen. Oxygen travels to all parts of the body through your blood. Hypoxemia can develop suddenly or can come on slowly. CAUSES Some common causes of hypoxemia include:  Long-term (chronic) lung diseases, such as chronic obstructive pulmonary disease (COPD) or interstitial lung disease.  Disorders that affect breathing at night, such as sleep apnea.  Fluid buildup in your lungs (pulmonary edema).  Lung infection (pneumonia).  Lung or throat cancer.  Abnormal blood flow that bypasses the lungs (shunt).  Certain  diseasesthat affect nerves or muscles.  A collapsed lung (pneumothorax).  A blood clot in the lungs (pulmonary embolus).  Certain types of heart disease.  Slow or shallow breathing (hypoventilation).  Certain medicines.  High altitudes.  Toxic chemicals and gases. SIGNS AND SYMPTOMS Not everyone who has hypoxemia will develop symptoms. If the hypoxemia developed quickly, you will likely have symptoms such as shortness of breath. If the hypoxemia came on slowly over months or years, you may not notice any symptoms. Symptoms can include:  Shortness of breath (dyspnea).  Bluish color of the skin, lips, or nail beds.  Breathing that is fast, noisy, or shallow.  A fast heartbeat.  Feeling tired or sleepy.  Being confused or feeling anxious. DIAGNOSIS To determine if you have hypoxemia, your health care provider may perform:  A physical exam.  Blood tests.  A pulse oximetry. A sensor will be put on your finger, toe, or earlobe to measure the percent of oxygen in your blood. TREATMENT  You will likely be treated with oxygen therapy. Depending on the cause of your hypoxemia, you may need oxygen for a short time (weeks or months), or you may need it indefinitely. Your health care provider may also recommend other therapies to treat the underlying cause of your hypoxemia. HOME CARE INSTRUCTIONS  Only take over-the-counter or prescription medicines as directed by your health care provider.  Follow oxygen safety measures if you are on oxygen therapy. These may include:  Always having a backup supply of oxygen.  Not allowing anyone to smoke around oxygen.  Handling the oxygen tanks carefully and as instructed.  If you smoke, quit. Stay away from people who smoke.  Follow up with your health care provider as directed. SEEK MEDICAL CARE IF:  You have any concerns about your oxygen therapy.  You still have trouble breathing.  You become short of breath when you  exercise.  You are tired when you wake up.  You have a headache when you wake up. SEEK IMMEDIATE MEDICAL CARE IF:   Your breathing gets worse.  You have new shortness of breath with normal activity.  You have a bluish color of the skin, lips, or nail beds.  You have confusion or cloudy thinking.  You cough up dark mucus.  You have chest pain.  You have a fever. MAKE SURE YOU:  Understand these instructions.  Will watch your condition.  Will get help right away if you are not doing well or get worse. Document Released: 08/31/2010 Document Revised: 02/20/2013 Document Reviewed: 09/14/2012 [facility] Patient Information 2015 [place], [place]. This information is not intended to replace advice given to you by your health care provider. Make sure you discuss any questions you have with your health care provider.

## 2014-09-21 NOTE — Progress Notes (Signed)
Patient is medically stable for D/C to [facility] today. Per Lorenza Evangelist at [facility] patient can return today to room 213-B. RN will call report at [phone] and arrange EMS for transport. [facility] authorization has been received. Auth # [id number]. Clinical Education officer, museum (CSW) prepared D/C packet and faxed D/C Summary to [place] at [place]. Patient's son is at bedside and aware of a above. Son inquired about Medicaid application. CSW gave contact information for Vance Gather and Herbie [place] Medicaid workers in [place]. Please reconsult if future social work needs arise. CSW signing off.   Blima Rich, [place] [phone]

## 2014-09-21 NOTE — Progress Notes (Signed)
Patient medically stable to discharge back to [facility].  Report called to Anderson Malta she will be going to room 213 a EMS has already been called.

## 2014-09-23 ENCOUNTER — Other Ambulatory Visit
Admission: RE | Admit: 2014-09-23 | Discharge: 2014-09-23 | Disposition: A | Discharge status: Home / Self Care | Payer: Commercial Managed Care - HMO | Source: Other Acute Inpatient Hospital | Attending: Gerontology | Admitting: Gerontology

## 2014-09-23 ENCOUNTER — Encounter: Payer: Self-pay | Admitting: Unknown Physician Specialty

## 2014-09-23 DIAGNOSIS — R252 Cramp and spasm: Secondary | ICD-10-CM | POA: Diagnosis present

## 2014-09-23 LAB — CBC WITH DIFFERENTIAL/PLATELET
BASOS PCT: 1 %
Basophils Absolute: 0.1 10*3/uL (ref 0–0.1)
Eosinophils Absolute: 0 10*3/uL (ref 0–0.7)
Eosinophils Relative: 0 %
HCT: 26.1 % — ABNORMAL LOW (ref 35.0–47.0)
Hemoglobin: 8.2 g/dL — ABNORMAL LOW (ref 12.0–16.0)
LYMPHS ABS: 0.8 10*3/uL — AB (ref 1.0–3.6)
Lymphocytes Relative: 7 %
MCH: 26.4 pg (ref 26.0–34.0)
MCHC: 31.6 g/dL — ABNORMAL LOW (ref 32.0–36.0)
MCV: 83.6 fL (ref 80.0–100.0)
MONO ABS: 0.7 10*3/uL (ref 0.2–0.9)
MONOS PCT: 6 %
NEUTROS ABS: 9.8 10*3/uL — AB (ref 1.4–6.5)
NEUTROS PCT: 86 %
Platelets: 238 10*3/uL (ref 150–440)
RBC: 3.12 MIL/uL — ABNORMAL LOW (ref 3.80–5.20)
RDW: 15.2 % — AB (ref 11.5–14.5)
WBC: 11.4 10*3/uL — ABNORMAL HIGH (ref 3.6–11.0)

## 2014-09-23 LAB — COMPREHENSIVE METABOLIC PANEL
ALBUMIN: 2 g/dL — AB (ref 3.5–5.0)
ALT: 27 U/L (ref 14–54)
AST: 25 U/L (ref 15–41)
Alkaline Phosphatase: 62 U/L (ref 38–126)
Anion gap: 10 (ref 5–15)
BILIRUBIN TOTAL: 0.3 mg/dL (ref 0.3–1.2)
BUN: 54 mg/dL — ABNORMAL HIGH (ref 6–20)
CHLORIDE: 97 mmol/L — AB (ref 101–111)
CO2: 26 mmol/L (ref 22–32)
Calcium: 8.1 mg/dL — ABNORMAL LOW (ref 8.9–10.3)
Creatinine, Ser: 2.34 mg/dL — ABNORMAL HIGH (ref 0.44–1.00)
GFR calc Af Amer: 22 mL/min — ABNORMAL LOW (ref >60)
GFR calc non Af Amer: 19 mL/min — ABNORMAL LOW (ref >60)
GLUCOSE: 261 mg/dL — AB (ref 65–99)
POTASSIUM: 4.7 mmol/L (ref 3.5–5.1)
Sodium: 133 mmol/L — ABNORMAL LOW (ref 135–145)
TOTAL PROTEIN: 5.1 g/dL — AB (ref 6.5–8.1)

## 2014-09-23 LAB — SURGICAL PATHOLOGY

## 2014-09-26 DIAGNOSIS — D649 Anemia, unspecified: Secondary | ICD-10-CM | POA: Diagnosis present

## 2014-09-26 DIAGNOSIS — N189 Chronic kidney disease, unspecified: Secondary | ICD-10-CM | POA: Diagnosis present

## 2014-09-26 LAB — BASIC METABOLIC PANEL
Anion gap: 11 (ref 5–15)
BUN: 108 mg/dL — AB (ref 6–20)
CHLORIDE: 98 mmol/L — AB (ref 101–111)
CO2: 26 mmol/L (ref 22–32)
Calcium: 8.5 mg/dL — ABNORMAL LOW (ref 8.9–10.3)
Creatinine, Ser: 2.43 mg/dL — ABNORMAL HIGH (ref 0.44–1.00)
GFR, EST AFRICAN AMERICAN: 21 mL/min — AB (ref >60)
GFR, EST NON AFRICAN AMERICAN: 18 mL/min — AB (ref >60)
Glucose, Bld: 132 mg/dL — ABNORMAL HIGH (ref 65–99)
POTASSIUM: 4.6 mmol/L (ref 3.5–5.1)
Sodium: 135 mmol/L (ref 135–145)

## 2014-09-27 ENCOUNTER — Telehealth: Payer: Self-pay | Admitting: *Deleted

## 2014-09-27 ENCOUNTER — Other Ambulatory Visit: Payer: Self-pay | Admitting: *Deleted

## 2014-09-27 NOTE — Telephone Encounter (Signed)
Pt had appt for 8/1 for a ct of the chest that was ordered back on 4/28 to be done a few days before she saw Dr. Ma Hillock, since the April date pt has been in hospital twice and seen both times in consult by dr pandit and he knew she had a Ct 6/29 and Pet 7/21 so therefore after checking with Dr. Mike Gip on call we are cancelling the appt for CT scan.  The patient had appt to see pandit 8/2 and [place].  Called Kim nurse for pt at [facility] place and told her this above and let her know I was cancelling CT scan for 8/1 and also need to change f/u appt from 8/2 to 8/3.  Kim walked into pt's room and told her above info and pt was agreeable to come Wed. Instead of Tues.  appt changes made. I also called Dominica Severin the nephew who is now POA that the appt changed and the ct cancelled due to already had radiology scan done the 2 times she was in the hospital. And that the appt for 8/2 needed to change to 8/3 due to pandit was out of the country and he and his wife were agreeable to the above changes.

## 2014-09-30 ENCOUNTER — Ambulatory Visit: Payer: Commercial Managed Care - HMO

## 2014-09-30 ENCOUNTER — Encounter
Admission: RE | Admit: 2014-09-30 | Discharge: 2014-09-30 | Disposition: A | Discharge status: Home / Self Care | Payer: Commercial Managed Care - HMO | Source: Ambulatory Visit | Attending: Internal Medicine | Admitting: Internal Medicine

## 2014-10-01 ENCOUNTER — Inpatient Hospital Stay: Payer: Commercial Managed Care - HMO | Admitting: Internal Medicine

## 2014-10-01 ENCOUNTER — Inpatient Hospital Stay: Payer: Commercial Managed Care - HMO

## 2014-10-02 ENCOUNTER — Other Ambulatory Visit: Payer: Self-pay | Admitting: *Deleted

## 2014-10-02 ENCOUNTER — Inpatient Hospital Stay: Payer: Commercial Managed Care - HMO | Attending: Internal Medicine

## 2014-10-02 ENCOUNTER — Inpatient Hospital Stay (HOSPITAL_BASED_OUTPATIENT_CLINIC_OR_DEPARTMENT_OTHER): Payer: Commercial Managed Care - HMO | Admitting: Internal Medicine

## 2014-10-02 VITALS — BP 126/63 | HR 79 | Temp 98.6°F | Resp 18 | Ht 60.0 in | Wt 147.3 lb

## 2014-10-02 DIAGNOSIS — Z803 Family history of malignant neoplasm of breast: Secondary | ICD-10-CM | POA: Diagnosis not present

## 2014-10-02 DIAGNOSIS — R0609 Other forms of dyspnea: Secondary | ICD-10-CM | POA: Diagnosis not present

## 2014-10-02 DIAGNOSIS — Z87891 Personal history of nicotine dependence: Secondary | ICD-10-CM | POA: Insufficient documentation

## 2014-10-02 DIAGNOSIS — Z79810 Long term (current) use of selective estrogen receptor modulators (SERMs): Secondary | ICD-10-CM

## 2014-10-02 DIAGNOSIS — Z7982 Long term (current) use of aspirin: Secondary | ICD-10-CM | POA: Diagnosis not present

## 2014-10-02 DIAGNOSIS — E039 Hypothyroidism, unspecified: Secondary | ICD-10-CM | POA: Diagnosis not present

## 2014-10-02 DIAGNOSIS — R05 Cough: Secondary | ICD-10-CM | POA: Diagnosis not present

## 2014-10-02 DIAGNOSIS — I129 Hypertensive chronic kidney disease with stage 1 through stage 4 chronic kidney disease, or unspecified chronic kidney disease: Secondary | ICD-10-CM | POA: Insufficient documentation

## 2014-10-02 DIAGNOSIS — M818 Other osteoporosis without current pathological fracture: Secondary | ICD-10-CM | POA: Diagnosis not present

## 2014-10-02 DIAGNOSIS — Z923 Personal history of irradiation: Secondary | ICD-10-CM | POA: Diagnosis not present

## 2014-10-02 DIAGNOSIS — J449 Chronic obstructive pulmonary disease, unspecified: Secondary | ICD-10-CM

## 2014-10-02 DIAGNOSIS — I2510 Atherosclerotic heart disease of native coronary artery without angina pectoris: Secondary | ICD-10-CM

## 2014-10-02 DIAGNOSIS — C50919 Malignant neoplasm of unspecified site of unspecified female breast: Secondary | ICD-10-CM

## 2014-10-02 DIAGNOSIS — C3432 Malignant neoplasm of lower lobe, left bronchus or lung: Secondary | ICD-10-CM | POA: Insufficient documentation

## 2014-10-02 DIAGNOSIS — J90 Pleural effusion, not elsewhere classified: Secondary | ICD-10-CM | POA: Diagnosis not present

## 2014-10-02 DIAGNOSIS — C3492 Malignant neoplasm of unspecified part of left bronchus or lung: Secondary | ICD-10-CM

## 2014-10-02 DIAGNOSIS — I10 Essential (primary) hypertension: Secondary | ICD-10-CM

## 2014-10-02 DIAGNOSIS — Z79899 Other long term (current) drug therapy: Secondary | ICD-10-CM | POA: Insufficient documentation

## 2014-10-02 DIAGNOSIS — N189 Chronic kidney disease, unspecified: Secondary | ICD-10-CM

## 2014-10-02 LAB — CREATININE, SERUM
Creatinine, Ser: 2.98 mg/dL — ABNORMAL HIGH (ref 0.44–1.00)
GFR calc Af Amer: 17 mL/min — ABNORMAL LOW (ref >60)
GFR, EST NON AFRICAN AMERICAN: 14 mL/min — AB (ref >60)

## 2014-10-02 LAB — CBC WITH DIFFERENTIAL/PLATELET
BASOS PCT: 1 %
Basophils Absolute: 0.1 10*3/uL (ref 0–0.1)
EOS ABS: 0 10*3/uL (ref 0–0.7)
EOS PCT: 0 %
HCT: 32.5 % — ABNORMAL LOW (ref 35.0–47.0)
HEMOGLOBIN: 10.3 g/dL — AB (ref 12.0–16.0)
Lymphocytes Relative: 5 %
Lymphs Abs: 0.7 10*3/uL — ABNORMAL LOW (ref 1.0–3.6)
MCH: 26.3 pg (ref 26.0–34.0)
MCHC: 31.7 g/dL — AB (ref 32.0–36.0)
MCV: 83.1 fL (ref 80.0–100.0)
MONO ABS: 0.6 10*3/uL (ref 0.2–0.9)
MONOS PCT: 4 %
Neutro Abs: 14 10*3/uL — ABNORMAL HIGH (ref 1.4–6.5)
Neutrophils Relative %: 90 %
PLATELETS: 246 10*3/uL (ref 150–440)
RBC: 3.92 MIL/uL (ref 3.80–5.20)
RDW: 15.5 % — ABNORMAL HIGH (ref 11.5–14.5)
WBC: 15.5 10*3/uL — ABNORMAL HIGH (ref 3.6–11.0)

## 2014-10-02 LAB — HEPATIC FUNCTION PANEL
ALT: 28 U/L (ref 14–54)
AST: 24 U/L (ref 15–41)
Albumin: 2.7 g/dL — ABNORMAL LOW (ref 3.5–5.0)
Alkaline Phosphatase: 81 U/L (ref 38–126)
Total Bilirubin: 0.4 mg/dL (ref 0.3–1.2)
Total Protein: 6.1 g/dL — ABNORMAL LOW (ref 6.5–8.1)

## 2014-10-09 ENCOUNTER — Inpatient Hospital Stay
Admission: EM | Admit: 2014-10-09 | Discharge: 2014-10-11 | DRG: 683 | Disposition: A | Discharge status: Skilled Nursing Facility | Payer: Commercial Managed Care - HMO | Attending: Internal Medicine | Admitting: Internal Medicine

## 2014-10-09 ENCOUNTER — Inpatient Hospital Stay: Payer: Commercial Managed Care - HMO

## 2014-10-09 ENCOUNTER — Emergency Department: Payer: Commercial Managed Care - HMO

## 2014-10-09 DIAGNOSIS — N184 Chronic kidney disease, stage 4 (severe): Secondary | ICD-10-CM | POA: Diagnosis present

## 2014-10-09 DIAGNOSIS — Z90710 Acquired absence of both cervix and uterus: Secondary | ICD-10-CM | POA: Diagnosis not present

## 2014-10-09 DIAGNOSIS — E039 Hypothyroidism, unspecified: Secondary | ICD-10-CM | POA: Diagnosis present

## 2014-10-09 DIAGNOSIS — Z9981 Dependence on supplemental oxygen: Secondary | ICD-10-CM

## 2014-10-09 DIAGNOSIS — Z833 Family history of diabetes mellitus: Secondary | ICD-10-CM

## 2014-10-09 DIAGNOSIS — Z803 Family history of malignant neoplasm of breast: Secondary | ICD-10-CM | POA: Diagnosis not present

## 2014-10-09 DIAGNOSIS — Z853 Personal history of malignant neoplasm of breast: Secondary | ICD-10-CM

## 2014-10-09 DIAGNOSIS — Z66 Do not resuscitate: Secondary | ICD-10-CM | POA: Diagnosis present

## 2014-10-09 DIAGNOSIS — T502X5A Adverse effect of carbonic-anhydrase inhibitors, benzothiadiazides and other diuretics, initial encounter: Secondary | ICD-10-CM | POA: Diagnosis present

## 2014-10-09 DIAGNOSIS — E785 Hyperlipidemia, unspecified: Secondary | ICD-10-CM | POA: Diagnosis present

## 2014-10-09 DIAGNOSIS — Z8249 Family history of ischemic heart disease and other diseases of the circulatory system: Secondary | ICD-10-CM | POA: Diagnosis not present

## 2014-10-09 DIAGNOSIS — Z794 Long term (current) use of insulin: Secondary | ICD-10-CM

## 2014-10-09 DIAGNOSIS — N179 Acute kidney failure, unspecified: Secondary | ICD-10-CM | POA: Diagnosis present

## 2014-10-09 DIAGNOSIS — Z886 Allergy status to analgesic agent status: Secondary | ICD-10-CM | POA: Diagnosis not present

## 2014-10-09 DIAGNOSIS — M810 Age-related osteoporosis without current pathological fracture: Secondary | ICD-10-CM | POA: Diagnosis present

## 2014-10-09 DIAGNOSIS — C3490 Malignant neoplasm of unspecified part of unspecified bronchus or lung: Secondary | ICD-10-CM | POA: Diagnosis present

## 2014-10-09 DIAGNOSIS — R809 Proteinuria, unspecified: Secondary | ICD-10-CM | POA: Diagnosis present

## 2014-10-09 DIAGNOSIS — I129 Hypertensive chronic kidney disease with stage 1 through stage 4 chronic kidney disease, or unspecified chronic kidney disease: Secondary | ICD-10-CM | POA: Diagnosis present

## 2014-10-09 DIAGNOSIS — N189 Chronic kidney disease, unspecified: Secondary | ICD-10-CM

## 2014-10-09 DIAGNOSIS — J449 Chronic obstructive pulmonary disease, unspecified: Secondary | ICD-10-CM | POA: Diagnosis present

## 2014-10-09 DIAGNOSIS — E1122 Type 2 diabetes mellitus with diabetic chronic kidney disease: Secondary | ICD-10-CM | POA: Diagnosis present

## 2014-10-09 DIAGNOSIS — Z87891 Personal history of nicotine dependence: Secondary | ICD-10-CM

## 2014-10-09 DIAGNOSIS — R5383 Other fatigue: Secondary | ICD-10-CM

## 2014-10-09 DIAGNOSIS — N141 Nephropathy induced by other drugs, medicaments and biological substances: Secondary | ICD-10-CM | POA: Diagnosis present

## 2014-10-09 LAB — URINALYSIS COMPLETE WITH MICROSCOPIC (ARMC ONLY)
Bacteria, UA: NONE SEEN
Bilirubin Urine: NEGATIVE
Glucose, UA: 150 mg/dL — AB
Hgb urine dipstick: NEGATIVE
Ketones, ur: NEGATIVE mg/dL
Leukocytes, UA: NEGATIVE
Nitrite: NEGATIVE
Protein, ur: 100 mg/dL — AB
Specific Gravity, Urine: 1.011 (ref 1.005–1.030)
pH: 7 (ref 5.0–8.0)

## 2014-10-09 LAB — CBC WITH DIFFERENTIAL/PLATELET
BASOS ABS: 0 10*3/uL (ref 0–0.1)
Basophils Relative: 0 %
EOS PCT: 0 %
Eosinophils Absolute: 0 10*3/uL (ref 0–0.7)
HEMATOCRIT: 27.9 % — AB (ref 35.0–47.0)
HEMOGLOBIN: 9 g/dL — AB (ref 12.0–16.0)
LYMPHS ABS: 0.6 10*3/uL — AB (ref 1.0–3.6)
LYMPHS PCT: 3 %
MCH: 26.7 pg (ref 26.0–34.0)
MCHC: 32.2 g/dL (ref 32.0–36.0)
MCV: 83 fL (ref 80.0–100.0)
MONO ABS: 0.5 10*3/uL (ref 0.2–0.9)
MONOS PCT: 3 %
Neutro Abs: 16.6 10*3/uL — ABNORMAL HIGH (ref 1.4–6.5)
Neutrophils Relative %: 94 %
Platelets: 180 10*3/uL (ref 150–440)
RBC: 3.36 MIL/uL — AB (ref 3.80–5.20)
RDW: 15.2 % — ABNORMAL HIGH (ref 11.5–14.5)
WBC: 17.7 10*3/uL — AB (ref 3.6–11.0)

## 2014-10-09 LAB — BRAIN NATRIURETIC PEPTIDE: B Natriuretic Peptide: 359 pg/mL — ABNORMAL HIGH (ref 0.0–100.0)

## 2014-10-09 LAB — BASIC METABOLIC PANEL WITH GFR
Anion gap: 14 (ref 5–15)
BUN: 130 mg/dL — ABNORMAL HIGH (ref 6–20)
CO2: 32 mmol/L (ref 22–32)
Calcium: 8.7 mg/dL — ABNORMAL LOW (ref 8.9–10.3)
Chloride: 86 mmol/L — ABNORMAL LOW (ref 101–111)
Creatinine, Ser: 4.11 mg/dL — ABNORMAL HIGH (ref 0.44–1.00)
GFR calc Af Amer: 11 mL/min — ABNORMAL LOW
GFR calc non Af Amer: 10 mL/min — ABNORMAL LOW
Glucose, Bld: 276 mg/dL — ABNORMAL HIGH (ref 65–99)
Potassium: 4.5 mmol/L (ref 3.5–5.1)
Sodium: 132 mmol/L — ABNORMAL LOW (ref 135–145)

## 2014-10-09 LAB — TROPONIN I: Troponin I: 0.03 ng/mL

## 2014-10-09 MED ORDER — ASPIRIN EC 81 MG PO TBEC
81.0000 mg | DELAYED_RELEASE_TABLET | Freq: Every day | ORAL | Status: DC
  Administered 2014-10-10 – 2014-10-11 (×2): 81 mg via ORAL
  Filled 2014-10-09 (×3): qty 1

## 2014-10-09 MED ORDER — GLUCERNA PO LIQD
1.0000 | Freq: Two times a day (BID) | ORAL | Status: DC
  Administered 2014-10-10 (×2): 1 via ORAL
  Administered 2014-10-11: 237 mL via ORAL
  Administered 2014-10-11: 1 via ORAL

## 2014-10-09 MED ORDER — HYDRALAZINE HCL 50 MG PO TABS
50.0000 mg | ORAL_TABLET | Freq: Two times a day (BID) | ORAL | Status: DC
  Administered 2014-10-10 – 2014-10-11 (×3): 50 mg via ORAL
  Filled 2014-10-09 (×4): qty 1

## 2014-10-09 MED ORDER — TIOTROPIUM BROMIDE MONOHYDRATE 18 MCG IN CAPS
18.0000 ug | ORAL_CAPSULE | Freq: Every day | RESPIRATORY_TRACT | Status: DC
  Administered 2014-10-10 – 2014-10-11 (×2): 18 ug via RESPIRATORY_TRACT
  Filled 2014-10-09: qty 5

## 2014-10-09 MED ORDER — TAMOXIFEN CITRATE 10 MG PO TABS
20.0000 mg | ORAL_TABLET | Freq: Every day | ORAL | Status: DC
  Administered 2014-10-10 – 2014-10-11 (×2): 20 mg via ORAL
  Filled 2014-10-09 (×3): qty 2

## 2014-10-09 MED ORDER — CALCIUM CARBONATE 600 MG PO TABS: 600.0000 mg | ORAL_TABLET | Freq: Two times a day (BID) | ORAL | Status: DC

## 2014-10-09 MED ORDER — CALCIUM CARBONATE ANTACID 500 MG PO CHEW: 2.0000 | CHEWABLE_TABLET | ORAL | Status: DC | PRN

## 2014-10-09 MED ORDER — PRAVASTATIN SODIUM 10 MG PO TABS
10.0000 mg | ORAL_TABLET | Freq: Every day | ORAL | Status: DC
  Administered 2014-10-10: 18:00:00 10 mg via ORAL
  Filled 2014-10-09: qty 1

## 2014-10-09 MED ORDER — ONDANSETRON HCL 4 MG PO TABS
4.0000 mg | ORAL_TABLET | ORAL | Status: DC | PRN
  Administered 2014-10-10: 15:00:00 4 mg via ORAL
  Filled 2014-10-09: qty 1

## 2014-10-09 MED ORDER — LEVOTHYROXINE SODIUM 100 MCG PO TABS
100.0000 ug | ORAL_TABLET | Freq: Every day | ORAL | Status: DC
  Administered 2014-10-10 – 2014-10-11 (×2): 100 ug via ORAL
  Filled 2014-10-09 (×2): qty 1

## 2014-10-09 MED ORDER — SODIUM CHLORIDE 0.9 % IV SOLN
INTRAVENOUS | Status: AC
  Administered 2014-10-09 – 2014-10-10 (×2): via INTRAVENOUS

## 2014-10-09 MED ORDER — GUAIFENESIN ER 600 MG PO TB12
600.0000 mg | ORAL_TABLET | Freq: Two times a day (BID) | ORAL | Status: DC
  Administered 2014-10-09 – 2014-10-11 (×4): 600 mg via ORAL
  Filled 2014-10-09 (×4): qty 1

## 2014-10-09 MED ORDER — HYDROCOD POLST-CPM POLST ER 10-8 MG/5ML PO SUER
5.0000 mL | Freq: Two times a day (BID) | ORAL | Status: DC
  Administered 2014-10-09 – 2014-10-11 (×4): 5 mL via ORAL
  Filled 2014-10-09 (×4): qty 5

## 2014-10-09 MED ORDER — ALBUTEROL SULFATE (2.5 MG/3ML) 0.083% IN NEBU: 2.5000 mg | INHALATION_SOLUTION | Freq: Four times a day (QID) | RESPIRATORY_TRACT | Status: DC | PRN

## 2014-10-09 MED ORDER — OXYCODONE-ACETAMINOPHEN 5-325 MG PO TABS: 1.0000 | ORAL_TABLET | Freq: Four times a day (QID) | ORAL | Status: DC | PRN

## 2014-10-09 MED ORDER — SODIUM CHLORIDE 0.9 % IV BOLUS (SEPSIS)
1000.0000 mL | Freq: Once | INTRAVENOUS | Status: AC
  Administered 2014-10-09: 1000 mL via INTRAVENOUS

## 2014-10-09 MED ORDER — IPRATROPIUM-ALBUTEROL 0.5-2.5 (3) MG/3ML IN SOLN: 3.0000 mL | RESPIRATORY_TRACT | Status: DC | PRN

## 2014-10-09 MED ORDER — MOMETASONE FURO-FORMOTEROL FUM 100-5 MCG/ACT IN AERO
2.0000 | INHALATION_SPRAY | Freq: Two times a day (BID) | RESPIRATORY_TRACT | Status: DC
  Administered 2014-10-09 – 2014-10-11 (×4): 2 via RESPIRATORY_TRACT
  Filled 2014-10-09: qty 8.8

## 2014-10-09 MED ORDER — BISACODYL 10 MG RE SUPP: 10.0000 mg | Freq: Every day | RECTAL | Status: DC | PRN

## 2014-10-09 MED ORDER — SENNA-DOCUSATE SODIUM 8.6-50 MG PO TABS
2.0000 | ORAL_TABLET | Freq: Two times a day (BID) | ORAL | Status: DC
  Administered 2014-10-09 – 2014-10-11 (×4): 2 via ORAL
  Filled 2014-10-09 (×8): qty 2

## 2014-10-09 MED ORDER — HEPARIN SODIUM (PORCINE) 5000 UNIT/ML IJ SOLN
5000.0000 [IU] | Freq: Three times a day (TID) | INTRAMUSCULAR | Status: DC
  Administered 2014-10-09 – 2014-10-11 (×6): 5000 [IU] via SUBCUTANEOUS
  Filled 2014-10-09 (×6): qty 1

## 2014-10-09 MED ORDER — ALPRAZOLAM 0.25 MG PO TABS
0.2500 mg | ORAL_TABLET | Freq: Every evening | ORAL | Status: DC | PRN
  Filled 2014-10-09: qty 1

## 2014-10-09 MED ORDER — MORPHINE SULFATE (CONCENTRATE) 10 MG/0.5ML PO SOLN
5.0000 mg | ORAL | Status: DC | PRN
Start: 2014-10-10 — End: 2014-10-11

## 2014-10-09 NOTE — ED Notes (Signed)
Pt discharged home after verbalizing understanding of discharge instructions; nad noted. 

## 2014-10-09 NOTE — Plan of Care (Signed)
Problem: Discharge Progression Outcomes Goal: Discharge plan in place and appropriate Individualization of care - Likes to be called Whitney Wright. - Lives at [place]. - Uses chronic 02, 2L per [place]. - On high falls precautions per policy, offer toileting during hourly rounds. - Uses walker at [facility]. - Has history of hypertension, diabetes and COPD, controlled by medications. - Has his of lung ca, tx with PO chemo.

## 2014-10-09 NOTE — ED Provider Notes (Signed)
[facility] Emergency Department Provider Note    ____________________________________________  Time seen: 1215  I have reviewed the triage vital signs and the nursing notes.   HISTORY  Chief Complaint Shortness of Breath   History limited by: Not Limited   HPI Whitney Wright is a 74 y.o. female who presents to the emergency department today because of concerns for fatigue and weakness. She states that she first noticed this when she woke up around 3:00. She states that it was worse between 3 and 5:30 AM. She states that now the time of my examination she is feeling better. She denied any chest pain or shortness of breath with the feelings of fatigue. She denies any recent nausea vomiting or diarrhea. She states she has had slightly decreased by mouth intake recently. She denies any recent fevers.     Past Medical History  Diagnosis Date  . Hypertension   . Cellulitis and abscess of trunk   . Thyroid disease     hypo  . Arthritis   . Personal history of malignant neoplasm of breast   . Diabetes mellitus without complication   . Senile osteoporosis   . 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 ER 50%, PR 0, HER-2/neu nonamplified.. Tamoxifen chosen as bone density showed severe osteoporosis.  . Lung cancer, upper lobe February 2015    Adenocarcinoma with lepidic pattern, second primary fall 2015  . Breast cancer   . COPD (chronic obstructive pulmonary disease)   . Shortness of breath dyspnea   . Chronic kidney disease     Patient Active Problem List   Diagnosis Date Noted  . Primary lung cancer   . Acute on chronic respiratory failure 08/21/2014  . Lung cancer, upper lobe 04/01/2013  . Personal history of breast cancer 08/28/2012  . Family history of breast cancer 08/28/2012    Past Surgical History  Procedure  Laterality Date  . Abdominal hysterectomy  age 74 . Mammosite balloon placement Left 2012  . Cardiac catheterization  2014  . Breast surgery Left January 11, 2011    left breast stereotactic biopsy: DCIS  . Appendectomy    . Flexible sigmoidoscopy N/A 09/20/2014    Procedure: FLEXIBLE SIGMOIDOSCOPY;  Surgeon: RManya Silvas MD;  [facility]  Service: Endoscopy;  Laterality: N/A;    Current Outpatient Rx  Name  Route  Sig  Dispense  Refill  . ADVAIR DISKUS 250-50 MCG/DOSE AEPB   Inhalation   Inhale 1 puff into the lungs 2 (two) times daily.       5   . albuterol (PROVENTIL HFA;VENTOLIN HFA) 108 (90 BASE) MCG/ACT inhaler   Inhalation   Inhale 2 puffs into the lungs every 6 (six) hours as needed for wheezing or shortness of breath.          . ALPRAZolam (XANAX) 0.25 MG tablet   Oral   Take 0.25 mg by mouth at bedtime as needed.       3   . Alum & Mag Hydroxide-Simeth (MAG-AL PLUS XS PO)   Oral   Take 30 mLs by mouth every 4 (four) hours as needed (for gas/ingestion.).         .Marland Kitchenaspirin EC 81 MG tablet   Oral   Take 81 mg by mouth daily.         . calcium carbonate (OS-CAL) 600  MG TABS tablet   Oral   Take 600 mg by mouth 2 (two) times daily with a meal.         . calcium carbonate (TUMS - DOSED IN MG ELEMENTAL CALCIUM) 500 MG chewable tablet   Oral   Chew 2 tablets by mouth every 4 (four) hours as needed for indigestion.         . chlorpheniramine-HYDROcodone (TUSSIONEX PENNKINETIC ER) 10-8 MG/5ML SUER   Oral   Take 5 mLs by mouth 2 (two) times daily.   140 mL   0   . Cholecalciferol 1000 UNITS TBDP   Oral   Take 1,000 Units by mouth daily.          Marland Kitchen GLUCERNA (GLUCERNA) LIQD   Oral   Take 1 Can by mouth 2 (two) times daily between meals.         . hydrALAZINE (APRESOLINE) 50 MG tablet   Oral   Take 1 tablet (50 mg total) by mouth 2 (two) times daily.   60 tablet   3   . hydrocortisone cream 1 %   Topical   Apply 1  application topically 4 (four) times daily as needed (for dry hemorrhoids.).         Marland Kitchen insulin glargine (LANTUS) 100 UNIT/ML injection   Subcutaneous   Inject 100 Units into the skin at bedtime. 18 units every 12 hours         . insulin NPH Human (HUMULIN N,NOVOLIN N) 100 UNIT/ML injection   Subcutaneous   Inject into the skin 2 (two) times daily. Use as directed twice daily.         . insulin regular (NOVOLIN R,HUMULIN R) 100 units/mL injection   Subcutaneous   Inject into the skin as needed for high blood sugar. Use as sliding scale.         Marland Kitchen ipratropium-albuterol (DUONEB) 0.5-2.5 (3) MG/3ML SOLN   Inhalation   Inhale 3 mLs into the lungs See admin instructions. Use 1 vial (67m) via nebulizer every 8 hours, and 1 vial (361m) via nebulizer every 4 hours as needed for wheezing /shortness of breath.      5   . levofloxacin (LEVAQUIN) 250 MG tablet   Oral   Take 1 tablet (250 mg total) by mouth daily.   14 tablet   0   . levothyroxine (SYNTHROID, LEVOTHROID) 100 MCG tablet   Oral   Take 100 mcg by mouth daily before breakfast. Take along witg 88 mcg tablet for total dose of 188 mcg.         . levothyroxine (SYNTHROID, LEVOTHROID) 88 MCG tablet   Oral   Take 88 mcg by mouth daily before breakfast. Take along with 100 mcg tablet for a total dose of 188 mcg.         . losartan (COZAAR) 100 MG tablet   Oral   Take 1 tablet by mouth daily.         . Marland Kitchenovastatin (MEVACOR) 20 MG tablet   Oral   Take 1 tablet by mouth daily.         . magnesium hydroxide (MILK OF MAGNESIA) 400 MG/5ML suspension   Oral   Take 30 mLs by mouth daily as needed for mild constipation or moderate constipation.         . ondansetron (ZOFRAN) 4 MG tablet   Oral   Take 4 mg by mouth every 4 (four) hours as needed for nausea.         .Marland Kitchen  oxyCODONE-acetaminophen (PERCOCET/ROXICET) 5-325 MG per tablet   Oral   Take 1 tablet by mouth every 6 (six) hours as needed (chest pain).   30  tablet   0   . predniSONE (DELTASONE) 10 MG tablet      Prednisone 10 mg tabs  Take 4 tabs po once daily for 3 days Take 3 tabs po once daily for 3 days Take 2 tabs po once daily for 3 days Take 1 tab po once daily for 3 days And then stop  Total: 30 tablets   30 tablet   0   . tamoxifen (NOLVADEX) 10 MG tablet   Oral   Take 20 mg by mouth daily.         . tiotropium (SPIRIVA) 18 MCG inhalation capsule   Inhalation   Place 18 mcg into inhaler and inhale daily.         . torsemide (DEMADEX) 20 MG tablet   Oral   Take 20 mg by mouth daily.         . verapamil (CALAN-SR) 240 MG CR tablet   Oral   Take 240 mg by mouth daily.           Allergies Hydrocodone-acetaminophen and Macrodantin  Family History  Problem Relation Age of Onset  . Breast cancer Daughter   . Breast cancer Mother   . Diabetes Mother   . Hypertension Mother   . Hypertension Sister     Social History Social History  Substance Use Topics  . Smoking status: Former Smoker -- 0.00 packs/day for 30 years  . Smokeless tobacco: Never Used  . Alcohol Use: No    Review of Systems  Constitutional: Negative for fever. Cardiovascular: Negative for chest pain. Respiratory: Negative for shortness of breath. Gastrointestinal: Negative for abdominal pain, vomiting and diarrhea. Genitourinary: Negative for dysuria. Musculoskeletal: Negative for back pain. Skin: Negative for rash. Neurological: Negative for headaches, focal weakness or numbness.   10-point ROS otherwise negative.  ____________________________________________   PHYSICAL EXAM:  VITAL SIGNS: ED Triage Vitals  Enc Vitals Group     BP --      Pulse Rate 10/09/14 1113 108     Resp 10/09/14 1113 16     Temp 10/09/14 1113 98.5 F (36.9 C)     Temp Source 10/09/14 1113 Oral     SpO2 10/09/14 1113 92 %     Weight --      Height 10/09/14 1113 5' (1.524 m)     Head Cir --      Peak Flow --      Pain Score 10/09/14 1116 0    Constitutional: Alert and oriented. Well appearing and in no distress. Eyes: Conjunctivae are normal. Left pupil roughly 4 mm, right pupil 2 mm, both reactive. Normal extraocular movements. ENT   Head: Normocephalic and atraumatic.   Nose: No congestion/rhinnorhea.   Mouth/Throat: Mucous membranes are moist.   Neck: No stridor. Hematological/Lymphatic/Immunilogical: No cervical lymphadenopathy. Cardiovascular: Tachycardic, regular rhythm.  No murmurs, rubs, or gallops. Respiratory: Normal respiratory effort without tachypnea nor retractions. Breath sounds are clear and equal bilaterally. No wheezes/rales/rhonchi. Gastrointestinal: Soft and nontender. No distention. Mild left sided CVA tenderness.  Genitourinary: Deferred Musculoskeletal: Normal range of motion in all extremities. No joint effusions.  No lower extremity tenderness nor edema. Neurologic:  Normal speech and language. No gross focal neurologic deficits are appreciated. Speech is normal.  Skin:  Skin is warm, dry and intact. No rash noted. Psychiatric: Mood and affect   are normal. Speech and behavior are normal. Patient exhibits appropriate insight and judgment.  ____________________________________________    LABS (pertinent positives/negatives)  Cr 4.11 ____________________________________________   EKG  I, Graydon Goodman, attending physician, personally viewed and interpreted this EKG  EKG Time: 1116 Rate: 105 Rhythm: Sinus tachycardia Axis: normal Intervals: qtc 425 QRS: narrow ST changes: no st elevation ____________________________________________    RADIOLOGY  CXR IMPRESSION: 1. Interval partial clearing of left lower lobe airspace disease with improving bilateral pleural effusions. 2. Stable upper right paraspinal soft tissue mass.  ____________________________________________   PROCEDURES  Procedure(s) performed: None  Critical Care performed:  No  ____________________________________________   INITIAL IMPRESSION / ASSESSMENT AND PLAN / ED COURSE  Pertinent labs & imaging results that were available during my care of the patient were reviewed by me and considered in my medical decision making (see chart for details).  Patient presented to the emergency department today because of episode of fatigue. Lab work does show elevated creatinine. In addition family since patient is becoming slightly more altered. Given elevated creatinine and altered mental status will admit.  ____________________________________________   FINAL CLINICAL IMPRESSION(S) / ED DIAGNOSES  Final diagnoses:  Other fatigue  AKI (acute kidney injury)     Graydon Goodman, MD 10/10/14 1526 

## 2014-10-09 NOTE — ED Notes (Signed)
MD at bedside. 

## 2014-10-09 NOTE — H&P (Signed)
[facility] [place] NAME: Whitney Wright    MR#:  [id number]  DATE OF BIRTH:  Oct 20, 1940  DATE OF ADMISSION:  10/09/2014  PRIMARY CARE PHYSICIAN: Tracie Harrier, MD   REQUESTING/REFERRING PHYSICIAN: Archie Balboa  CHIEF COMPLAINT:   Chief Complaint  Patient presents with  . Shortness of Breath    HISTORY OF PRESENT ILLNESS: Whitney Wright  is a 74 y.o. female with a known history of hypertension, arthritis, diabetes, lung cancer in the past, COPD, chronic kidney disease Baseline creatinine is 1.71 as noted last month in hospital- was admitted to hospital for respiratory failure and COPD and was discharged back to her rehabilitation place where she lives for last 6 month, a week ago. Today morning she said that she did not feel well and so she spoke to the nurses over there, on further questioning she denies any specific complaints but says she did not feel well and they sent her to the hospital emergency room for further evaluation. She is on chronic oxygen.  She was found to have worsening in her renal function compared to 1 week ago, and so given for admission to hospitalist team for further management. Urinalysis still need to be collected, she has no rise in her WBC count compared to last week.  PAST MEDICAL HISTORY:   Past Medical History  Diagnosis Date  . Hypertension   . Cellulitis and abscess of trunk   . Thyroid disease     hypo  . Arthritis   . Personal history of malignant neoplasm of breast   . Diabetes mellitus without complication   . Senile osteoporosis   . 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 ER 50%, PR 0, HER-2/neu nonamplified.. Tamoxifen chosen as bone density showed severe osteoporosis.  . Lung cancer, upper lobe February 2015    Adenocarcinoma with lepidic pattern, second primary fall 2015   . Breast cancer   . COPD (chronic obstructive pulmonary disease)   . Shortness of breath dyspnea   . Chronic kidney disease     PAST SURGICAL HISTORY:  Past Surgical History  Procedure Laterality Date  . Abdominal hysterectomy  age 74 . Mammosite balloon placement Left 2012  . Cardiac catheterization  2014  . Breast surgery Left January 11, 2011    left breast stereotactic biopsy: DCIS  . Appendectomy    . Flexible sigmoidoscopy N/A 09/20/2014    Procedure: FLEXIBLE SIGMOIDOSCOPY;  Surgeon: RManya Silvas MD;  [facility];  Service: Endoscopy;  Laterality: N/A;    SOCIAL HISTORY:  Social History  Substance Use Topics  . Smoking status: Former Smoker -- 0.00 packs/day for 30 years  . Smokeless tobacco: Never Used  . Alcohol Use: No    FAMILY HISTORY:  Family History  Problem Relation Age of Onset  . Breast cancer Daughter   . Breast cancer Mother   . Diabetes Mother   . Hypertension Mother   . Hypertension Sister     DRUG ALLERGIES:  Allergies  Allergen Reactions  . Hydrocodone-Acetaminophen Rash  . Macrodantin [Nitrofurantoin] Rash    REVIEW OF SYSTEMS:   CONSTITUTIONAL: No fever,  Positive for fatigue or weakness.  EYES: No blurred or double vision.  EARS, NOSE, AND THROAT: No tinnitus or ear pain.  RESPIRATORY: No cough, shortness of breath, wheezing or hemoptysis.  CARDIOVASCULAR: No chest pain, orthopnea,  edema.  GASTROINTESTINAL: No nausea, vomiting, diarrhea or abdominal pain.  GENITOURINARY: No dysuria, hematuria.  ENDOCRINE: No polyuria, nocturia,  HEMATOLOGY: No anemia, easy bruising or bleeding SKIN: No rash or lesion. MUSCULOSKELETAL: No joint pain or arthritis.   NEUROLOGIC: No tingling, numbness, weakness.  PSYCHIATRY: No anxiety or depression.   MEDICATIONS AT HOME:  Prior to Admission medications   Medication Sig Start Date End Date Taking? Authorizing Provider  acetaminophen (TYLENOL) 325 MG tablet Take 650 mg by mouth  every 4 (four) hours as needed for mild pain, moderate pain or fever.   Yes Historical Provider, MD  ADVAIR DISKUS 250-50 MCG/DOSE AEPB Inhale 1 puff into the lungs 2 (two) times daily.  03/05/14  Yes Historical Provider, MD  albuterol (PROVENTIL HFA;VENTOLIN HFA) 108 (90 BASE) MCG/ACT inhaler Inhale 2 puffs into the lungs every 6 (six) hours as needed for wheezing or shortness of breath.    Yes Historical Provider, MD  ALPRAZolam Duanne Moron) 0.25 MG tablet Take 0.25 mg by mouth at bedtime as needed for anxiety.  02/26/14  Yes Historical Provider, MD  Alum & Mag Hydroxide-Simeth (MAG-AL PLUS XS PO) Take 30 mLs by mouth every 4 (four) hours as needed (for gas/ingestion.).   Yes Historical Provider, MD  Amino Acids-Protein Hydrolys (FEEDING SUPPLEMENT, PRO-STAT SUGAR FREE 64,) LIQD Take 30 mLs by mouth 3 (three) times daily with meals.   Yes Historical Provider, MD  aspirin EC 81 MG tablet Take 81 mg by mouth daily.   Yes Historical Provider, MD  bisacodyl (DULCOLAX) 10 MG suppository Place 10 mg rectally daily as needed for moderate constipation.   Yes Historical Provider, MD  calcium carbonate (OS-CAL) 600 MG TABS tablet Take 600 mg by mouth 2 (two) times daily with a meal.   Yes Historical Provider, MD  calcium carbonate (TUMS - DOSED IN MG ELEMENTAL CALCIUM) 500 MG chewable tablet Chew 2 tablets by mouth every 4 (four) hours as needed for indigestion.   Yes Historical Provider, MD  chlorpheniramine-HYDROcodone (TUSSIONEX PENNKINETIC ER) 10-8 MG/5ML SUER Take 5 mLs by mouth 2 (two) times daily. 08/17/14  Yes Earleen Newport, MD  Cholecalciferol 1000 UNITS TBDP Take 1,000 Units by mouth daily.    Yes Historical Provider, MD  GLUCERNA Barrie Folk) LIQD Take 1 Can by mouth 2 (two) times daily between meals.   Yes Historical Provider, MD  guaiFENesin (MUCINEX) 600 MG 12 hr tablet Take 600 mg by mouth 2 (two) times daily.   Yes Historical Provider, MD  hydrocortisone cream 1 % Apply 1 application topically 4  (four) times daily as needed (for dry hemorrhoids.).   Yes Historical Provider, MD  insulin NPH Human (HUMULIN N,NOVOLIN N) 100 UNIT/ML injection Inject into the skin 2 (two) times daily. Use as directed twice daily.   Yes Historical Provider, MD  ipratropium-albuterol (DUONEB) 0.5-2.5 (3) MG/3ML SOLN Inhale 3 mLs into the lungs See admin instructions. Use 1 vial (21m) via nebulizer every 8 hours, and 1 vial (312m) via nebulizer every 4 hours as needed for wheezing /shortness of breath. 12/19/13  Yes Historical Provider, MD  levothyroxine (SYNTHROID, LEVOTHROID) 100 MCG tablet Take 100 mcg by mouth daily before breakfast. Take along witg 88 mcg tablet for total dose of 188 mcg. 03/08/14  Yes Historical Provider, MD  levothyroxine (SYNTHROID, LEVOTHROID) 88 MCG tablet Take 88 mcg by mouth daily before breakfast. Take along with 100 mcg tablet for a total dose of 188 mcg. 03/08/14  Yes Historical Provider, MD  lovastatin (MEVACOR) 20 MG  tablet Take 1 tablet by mouth daily. 07/04/12  Yes Historical Provider, MD  magnesium hydroxide (MILK OF MAGNESIA) 400 MG/5ML suspension Take 30 mLs by mouth daily as needed for mild constipation or moderate constipation.   Yes Historical Provider, MD  metolazone (ZAROXOLYN) 5 MG tablet Take 5 mg by mouth every other day.   Yes Historical Provider, MD  morphine (ROXANOL) 20 MG/ML concentrated solution Take 5-10 mg by mouth See admin instructions. Take 63m (0.27m every 4 hours. May take 5-1027m0.68m52m5ml) every hour as needed for pain or dyspnea   Yes Historical Provider, MD  ondansetron (ZOFRAN) 4 MG tablet Take 4 mg by mouth every 4 (four) hours as needed for nausea.   Yes Historical Provider, MD  oxyCODONE-acetaminophen (PERCOCET/ROXICET) 5-325 MG per tablet Take 1 tablet by mouth every 6 (six) hours as needed (chest pain). 08/29/14  Yes Vishwanath Hande, MD  potassium chloride (MICRO-K) 10 MEQ CR capsule Take 10 mEq by mouth daily.   Yes Historical Provider, MD  predniSONE  (DELTASONE) 20 MG tablet Take 20 mg by mouth See admin instructions. Take 3 tabs daily for 5 days, then 2 tabs for 5 days and then 1 daily continuous or until weaned further per orders 10/08/14  Yes Historical Provider, MD  sennosides-docusate sodium (SENOKOT-S) 8.6-50 MG tablet Take 2 tablets by mouth 2 (two) times daily.   Yes Historical Provider, MD  tamoxifen (NOLVADEX) 10 MG tablet Take 20 mg by mouth daily.   Yes Historical Provider, MD  tiotropium (SPIRIVA) 18 MCG inhalation capsule Place 18 mcg into inhaler and inhale daily.   Yes Historical Provider, MD  torsemide (DEMADEX) 20 MG tablet Take 40 mg by mouth daily.    Yes Historical Provider, MD  hydrALAZINE (APRESOLINE) 50 MG tablet Take 1 tablet (50 mg total) by mouth 2 (two) times daily. Patient not taking: Reported on 10/09/2014 08/29/14   VishTracie Harrier  levofloxacin (LEVAQUIN) 250 MG tablet Take 1 tablet (250 mg total) by mouth daily. Patient not taking: Reported on 10/09/2014 08/29/14   VishTracie Harrier      PHYSICAL EXAMINATION:   VITAL SIGNS: Blood pressure 147/71, pulse 97, temperature 98.5 F (36.9 C), temperature source Oral, resp. rate 15, height 5' (1.524 m), SpO2 97 %.  GENERAL:  74 y53.-year-old patient lying in the bed with no acute distress.  EYES: Pupils equal, round, reactive to light and accommodation. No scleral icterus. Extraocular muscles intact.  HEENT: Head atraumatic, normocephalic. Oropharynx and nasopharynx clear.  NECK:  Supple, no jugular venous distention. No thyroid enlargement, no tenderness.  LUNGS: Normal breath sounds bilaterally, no wheezing, rales,rhonchi or crepitation. No use of accessory muscles of respiration.  CARDIOVASCULAR: S1, S2 normal. No murmurs, rubs, or gallops.  ABDOMEN: Soft, nontender, nondistended. Bowel sounds present. No organomegaly or mass.  EXTREMITIES: No pedal edema, cyanosis, or clubbing.  NEUROLOGIC: Cranial nerves II through XII are intact. Muscle strength 4/5 in  all extremities. Sensation intact. Gait not checked.  PSYCHIATRIC: The patient is alert and oriented x 3.  SKIN: No obvious rash, lesion, or ulcer.   LABORATORY PANEL:   CBC  Recent Labs Lab 10/09/14 1223  WBC 17.7*  HGB 9.0*  HCT 27.9*  PLT 180  MCV 83.0  MCH 26.7  MCHC 32.2  RDW 15.2*  LYMPHSABS 0.6*  MONOABS 0.5  EOSABS 0.0  BASOSABS 0.0   ------------------------------------------------------------------------------------------------------------------  Chemistries   Recent Labs Lab 10/09/14 1116  NA 132*  K 4.5  CL 86*  CO2 32  GLUCOSE 276*  BUN 130*  CREATININE 4.11*  CALCIUM 8.7*   ------------------------------------------------------------------------------------------------------------------ estimated creatinine clearance is 9.9 mL/min (by C-G formula based on Cr of 4.11). ------------------------------------------------------------------------------------------------------------------ No results for input(s): TSH, T4TOTAL, T3FREE, THYROIDAB in the last 72 hours.  Invalid input(s): FREET3   Coagulation profile No results for input(s): INR, PROTIME in the last 168 hours. ------------------------------------------------------------------------------------------------------------------- No results for input(s): DDIMER in the last 72 hours. -------------------------------------------------------------------------------------------------------------------  Cardiac Enzymes  Recent Labs Lab 10/09/14 1116  TROPONINI 0.03   ------------------------------------------------------------------------------------------------------------------ Invalid input(s): POCBNP  ---------------------------------------------------------------------------------------------------------------  Urinalysis    Component Value Date/Time   COLORURINE YELLOW* 09/15/2014 1857   COLORURINE Yellow 09/28/2011 1435   APPEARANCEUR CLEAR* 09/15/2014 1857   APPEARANCEUR Hazy  09/28/2011 1435   LABSPEC 1.008 09/15/2014 1857   LABSPEC 1.015 09/28/2011 1435   PHURINE 6.0 09/15/2014 1857   PHURINE 6.0 09/28/2011 1435   GLUCOSEU 50* 09/15/2014 1857   GLUCOSEU 50 mg/dL 09/28/2011 1435   HGBUR NEGATIVE 09/15/2014 1857   HGBUR Negative 09/28/2011 1435   BILIRUBINUR NEGATIVE 09/15/2014 1857   BILIRUBINUR Negative 09/28/2011 1435   KETONESUR NEGATIVE 09/15/2014 1857   KETONESUR Negative 09/28/2011 1435   PROTEINUR 100* 09/15/2014 1857   PROTEINUR 100 mg/dL 09/28/2011 1435   NITRITE NEGATIVE 09/15/2014 1857   NITRITE Negative 09/28/2011 1435   LEUKOCYTESUR NEGATIVE 09/15/2014 1857   LEUKOCYTESUR Negative 09/28/2011 1435     RADIOLOGY: Dg Chest 2 View  10/09/2014   CLINICAL DATA:  Shortness of breath with difficulty breathing. History of COPD and breast cancer Initial encounter.  EXAM: CHEST  2 VIEW  COMPARISON:  Radiographs 09/16/2014 and 09/15/2014.  CT 08/28/2014.  FINDINGS: The heart size and mediastinal contours are stable with aortic atherosclerosis. 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 There has been interval partial clearing of the left lower lobe airspace disease demonstrated on the most recent examination. There are improving bilateral pleural effusions. Underlying emphysema and bibasilar atelectasis are present. The bones appear unchanged. Probable bilateral cervical ribs.  IMPRESSION: 1. Interval partial clearing of left lower lobe airspace disease with improving bilateral pleural effusions. 2. Stable upper right paraspinal soft tissue mass.   Electronically Signed   By: Richardean Sale M.D.   On: 10/09/2014 12:17   IMPRESSION AND PLAN:  * Acute on chronic renal failure  She was on torsemide and Levaquin after being discharged last week  We'll give gentle IV fluid and follow her renal function.  Her baseline creatinine was 1.7 as per July 2016 hospital records.  Avoid nephrotoxic  medications.  Ultrasound kidney and nephrology consult.  * COPD  Currently no exacerbation symptoms continue inhalers and nebulizers at home  * Hypothyroidism  Continue levothyroxine  * Elevated white cell count  Patient does not have any urinary complaints, chest x-ray looks better than last week.  She was discharged on tapering oral steroids, most likely that is the reason.  I will check urinalysis.  * Hyperlipidemia  Continue statin.  All the records are reviewed and case discussed with ED provider. Management plans discussed with the patient, family and they are in agreement.  CODE STATUS: Advance Directive Documentation        Most Recent Value   Type of Advance Directive  Out of facility DNR (pink MOST or yellow form)   Pre-existing out of facility DNR order (yellow form or pink MOST form)     "MOST" Form in Place?  TOTAL TIME TAKING CARE OF THIS PATIENT: 50 minutes.    Vaughan Basta M.D on 10/09/2014   Between 7am to 6pm - Pager - [phone]  After 6pm go to [email or web address] - password [email or web address] [facility]  Office  [phone]  CC: Primary care physician; Tracie Harrier, MD

## 2014-10-09 NOTE — Discharge Instructions (Signed)
Please seek medical attention for any high fevers, chest pain, shortness of breath, change in behavior, persistent vomiting, bloody stool or any other new or concerning symptoms.   Fatigue Fatigue is a feeling of tiredness, lack of energy, lack of motivation, or feeling tired all the time. Having enough rest, good nutrition, and reducing stress will normally reduce fatigue. Consult your caregiver if it persists. The nature of your fatigue will help your caregiver to find out its cause. The treatment is based on the cause.  CAUSES  There are many causes for fatigue. Most of the time, fatigue can be traced to one or more of your habits or routines. Most causes fit into one or more of three general areas. They are: Lifestyle problems  Sleep disturbances.  Overwork.  Physical exertion.  Unhealthy habits.  Poor eating habits or eating disorders.  Alcohol and/or drug use .  Lack of proper nutrition (malnutrition). Psychological problems  Stress and/or anxiety problems.  Depression.  Grief.  Boredom. Medical Problems or Conditions  Anemia.  Pregnancy.  Thyroid gland problems.  Recovery from major surgery.  Continuous pain.  Emphysema or asthma that is not well controlled  Allergic conditions.  Diabetes.  Infections (such as mononucleosis).  Obesity.  Sleep disorders, such as sleep apnea.  Heart failure or other heart-related problems.  Cancer.  Kidney disease.  Liver disease.  Effects of certain medicines such as antihistamines, cough and cold remedies, prescription pain medicines, heart and blood pressure medicines, drugs used for treatment of cancer, and some antidepressants. SYMPTOMS  The symptoms of fatigue include:   Lack of energy.  Lack of drive (motivation).  Drowsiness.  Feeling of indifference to the surroundings. DIAGNOSIS  The details of how you feel help guide your caregiver in finding out what is causing the fatigue. You will be asked  about your present and past health condition. It is important to review all medicines that you take, including prescription and non-prescription items. A thorough exam will be done. You will be questioned about your feelings, habits, and normal lifestyle. Your caregiver may suggest blood tests, urine tests, or other tests to look for common medical causes of fatigue.  TREATMENT  Fatigue is treated by correcting the underlying cause. For example, if you have continuous pain or depression, treating these causes will improve how you feel. Similarly, adjusting the dose of certain medicines will help in reducing fatigue.  HOME CARE INSTRUCTIONS   Try to get the required amount of good sleep every night.  Eat a healthy and nutritious diet, and drink enough water throughout the day.  Practice ways of relaxing (including yoga or meditation).  Exercise regularly.  Make plans to change situations that cause stress. Act on those plans so that stresses decrease over time. Keep your work and personal routine reasonable.  Avoid street drugs and minimize use of alcohol.  Start taking a daily multivitamin after consulting your caregiver. SEEK MEDICAL CARE IF:   You have persistent tiredness, which cannot be accounted for.  You have fever.  You have unintentional weight loss.  You have headaches.  You have disturbed sleep throughout the night.  You are feeling sad.  You have constipation.  You have dry skin.  You have gained weight.  You are taking any new or different medicines that you suspect are causing fatigue.  You are unable to sleep at night.  You develop any unusual swelling of your legs or other parts of your body. SEEK IMMEDIATE MEDICAL CARE IF:  You are feeling confused.  Your vision is blurred.  You feel faint or pass out.  You develop severe headache.  You develop severe abdominal, pelvic, or back pain.  You develop chest pain, shortness of breath, or an irregular  or fast heartbeat.  You are unable to pass a normal amount of urine.  You develop abnormal bleeding such as bleeding from the rectum or you vomit blood.  You have thoughts about harming yourself or committing suicide.  You are worried that you might harm someone else. MAKE SURE YOU:   Understand these instructions.  Will watch your condition.  Will get help right away if you are not doing well or get worse. Document Released: 12/13/2006 Document Revised: 05/10/2011 Document Reviewed: 06/19/2013 [facility] Patient Information 2015 [place], [place]. This information is not intended to replace advice given to you by your health care provider. Make sure you discuss any questions you have with your health care provider.

## 2014-10-09 NOTE — ED Notes (Signed)
Pt brought to ED via EMS from [facility] w/ c/o SOB.. Pt at [facility] to recover from pneumonia.  Pt hx of breast and lung CA.  Pt normally on 2L but currently on 6L and SP02 in 80's.

## 2014-10-10 LAB — BASIC METABOLIC PANEL
ANION GAP: 12 (ref 5–15)
BUN: 104 mg/dL — ABNORMAL HIGH (ref 6–20)
CALCIUM: 8.6 mg/dL — AB (ref 8.9–10.3)
CO2: 34 mmol/L — AB (ref 22–32)
CREATININE: 3.54 mg/dL — AB (ref 0.44–1.00)
Chloride: 94 mmol/L — ABNORMAL LOW (ref 101–111)
GFR calc Af Amer: 13 mL/min — ABNORMAL LOW (ref >60)
GFR calc non Af Amer: 12 mL/min — ABNORMAL LOW (ref >60)
Glucose, Bld: 109 mg/dL — ABNORMAL HIGH (ref 65–99)
Potassium: 3.7 mmol/L (ref 3.5–5.1)
Sodium: 140 mmol/L (ref 135–145)

## 2014-10-10 LAB — PROTEIN / CREATININE RATIO, URINE
CREATININE, URINE: 69 mg/dL
PROTEIN CREATININE RATIO: 1.23 mg/mg{creat} — AB (ref 0.00–0.15)
TOTAL PROTEIN, URINE: 85 mg/dL

## 2014-10-10 LAB — GLUCOSE, CAPILLARY
GLUCOSE-CAPILLARY: 108 mg/dL — AB (ref 65–99)
Glucose-Capillary: 112 mg/dL — ABNORMAL HIGH (ref 65–99)
Glucose-Capillary: 243 mg/dL — ABNORMAL HIGH (ref 65–99)
Glucose-Capillary: 153 mg/dL — ABNORMAL HIGH (ref 65–99)

## 2014-10-10 LAB — CBC
HCT: 25.6 % — ABNORMAL LOW (ref 35.0–47.0)
HEMOGLOBIN: 8.3 g/dL — AB (ref 12.0–16.0)
MCH: 27.2 pg (ref 26.0–34.0)
MCHC: 32.6 g/dL (ref 32.0–36.0)
MCV: 83.5 fL (ref 80.0–100.0)
Platelets: 162 10*3/uL (ref 150–440)
RBC: 3.07 MIL/uL — AB (ref 3.80–5.20)
RDW: 15 % — ABNORMAL HIGH (ref 11.5–14.5)
WBC: 12.8 10*3/uL — AB (ref 3.6–11.0)

## 2014-10-10 LAB — MRSA PCR SCREENING: MRSA by PCR: NEGATIVE

## 2014-10-10 MED ORDER — INSULIN ASPART 100 UNIT/ML ~~LOC~~ SOLN: 0.0000 [IU] | Freq: Three times a day (TID) | SUBCUTANEOUS | Status: DC

## 2014-10-10 MED ORDER — INSULIN ASPART 100 UNIT/ML ~~LOC~~ SOLN
0.0000 [IU] | Freq: Three times a day (TID) | SUBCUTANEOUS | Status: DC
  Administered 2014-10-10 (×2): 5 [IU] via SUBCUTANEOUS
  Administered 2014-10-11: 13:00:00 8 [IU] via SUBCUTANEOUS
  Filled 2014-10-10: qty 5
  Filled 2014-10-10: qty 8
  Filled 2014-10-10: qty 3

## 2014-10-10 MED ORDER — ONDANSETRON HCL 4 MG/2ML IJ SOLN
4.0000 mg | Freq: Once | INTRAMUSCULAR | Status: AC
  Administered 2014-10-10: 4 mg via INTRAVENOUS
  Filled 2014-10-10: qty 2

## 2014-10-10 NOTE — Progress Notes (Signed)
[place]  [phone] [phone]) [phone]     ID: Whitney Wright OB: April 26, 1940  MR#: [id number]  XYO#:[id number]  Patient Care Team: Tracie Harrier, MD as PCP - General (Internal Medicine) Robert Bellow, MD (General Surgery) Tracie Harrier, MD as Physician Assistant (Internal Medicine)  CHIEF COMPLAINT/DIAGNOSIS:  1. History of Breast Ca Stage IA, ER+/PR-, Her2neu neg. Stage I (T1 A. NX M0) invasive mammary carcinoma status post wide local excision in 2013 followed by Radiation therapy. On tamoxifen.   2. Recurrent Non-small cell lung cancer. 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 Completed radiation therapy (SBRT) in March 2015. 01/15/14 - PET scan. Progressive enlargement of previously described left lower lobe nodule which demonstrates low level hypermetabolic activity, suspicious for neoplasm such as a primary bronchogenic adenocarcinoma. 01/30/14 - Pathology Report. LUNG, LEFT LOWER LOBE; CT-GUIDED CORE BIOPSY: MUCINOUS ADENOCARCINOMA WITH LEPIDIC PATTERN. EGFR, ALK, ROS-1, RET all negative.  Completed course of radiation early January 2016.   HISTORY OF PRESENT ILLNESS:  Patient returns for continued oncology followup, she was recently hospitalized with recurrent respiratory symptoms including intermittent left lower chest discomfort/pain, and had PET scan done. Currently states pain is better. She was diagnosed with recurrent lung cancer in December 2015, and got single modality radiation treatment. States that chronic DOE and cough is about the same. No hemoptysis. No orthopnea or PND. No fevers. Appetite fair.    REVIEW OF SYSTEMS:   ROS As in HPI above. In addition, no fever, chills or sweats. No new headaches or focal weakness.  No new  sore throat or dysphagia. No abdominal pain, constipation, diarrhea, dysuria or hematuria. No new skin rash or bleeding symptoms. No new paresthesias in extremities.  PS ECOG 2-3.  PAST MEDICAL HISTORY: Reviewed. Past Medical History  Diagnosis Date  . Hypertension   . Cellulitis and abscess of trunk   . Thyroid disease     hypo  . Arthritis   . Personal history of malignant neoplasm of breast   . Diabetes mellitus without complication   . Senile osteoporosis   . Malignant neoplasm of upper-outer quadrant of female breast January 25, 2011    Extensive intraductal carcinoma with focal ( 48m) invasive cancer, T1a, N0, M0. ER 50%, PR 0, HER-2/neu nonamplified.. Tamoxifen chosen as bone density showed severe osteoporosis.  . Lung cancer, upper lobe February 2015    Adenocarcinoma with lepidic pattern, second primary fall 2015  . Breast cancer   . COPD (chronic obstructive pulmonary disease)   . Shortness of breath dyspnea   . Chronic kidney disease     PAST SURGICAL HISTORY: Reviewed. Past Surgical History  Procedure Laterality Date  . Abdominal hysterectomy  age 74 . Mammosite balloon placement Left 2012  . Cardiac catheterization  2014  . Breast surgery Left January 11, 2011    left breast stereotactic biopsy: DCIS  . Appendectomy    . Flexible sigmoidoscopy N/A 09/20/2014    Procedure: FLEXIBLE SIGMOIDOSCOPY;  Surgeon: RManya Silvas MD;  [facility];  Service: Endoscopy;  Laterality: N/A;    FAMILY HISTORY: Reviewed. Family History  Problem Relation Age of Onset  . Breast cancer Daughter   . Breast cancer Mother   . Diabetes Mother   . Hypertension Mother   . Hypertension Sister     SOCIAL HISTORY: Reviewed. Social History  Substance Use Topics  . Smoking status: Former Smoker -- 0.00 packs/day for 30 years  . Smokeless tobacco: Never Used  .  Alcohol Use: No    Allergies  Allergen Reactions  . Hydrocodone-Acetaminophen Rash  . Macrodantin [Nitrofurantoin] Rash    No current facility-administered medications for this visit.   No current outpatient prescriptions on file.   Facility-Administered  Medications Ordered in Other Visits  Medication Dose Route Frequency Provider Last Rate Last Dose  . 0.9 %  sodium chloride infusion   Intravenous Continuous Vaughan Basta, MD 75 mL/hr at 10/09/14 2121    . albuterol (PROVENTIL) (2.5 MG/3ML) 0.083% nebulizer solution 2.5 mg  2.5 mg Inhalation Q6H PRN Vaughan Basta, MD      . ALPRAZolam Duanne Moron) tablet 0.25 mg  0.25 mg Oral QHS PRN Vaughan Basta, MD      . aspirin EC tablet 81 mg  81 mg Oral Daily Vaughan Basta, MD   81 mg at 10/10/14 0839  . bisacodyl (DULCOLAX) suppository 10 mg  10 mg Rectal Daily PRN Vaughan Basta, MD      . calcium carbonate (TUMS - dosed in mg elemental calcium) chewable tablet 400 mg of elemental calcium  2 tablet Oral Q4H PRN Vaughan Basta, MD      . chlorpheniramine-HYDROcodone (TUSSIONEX) 10-8 MG/5ML suspension 5 mL  5 mL Oral BID Vaughan Basta, MD   5 mL at 10/10/14 0839  . GLUCERNA (GLUCERNA) liquid 1 Can  1 Can Oral BID BM Vaughan Basta, MD      . guaiFENesin (MUCINEX) 12 hr tablet 600 mg  600 mg Oral BID Vaughan Basta, MD   600 mg at 10/10/14 0837  . heparin injection 5,000 Units  5,000 Units Subcutaneous 3 times per day Vaughan Basta, MD   5,000 Units at 10/10/14 0554  . hydrALAZINE (APRESOLINE) tablet 50 mg  50 mg Oral BID Vaughan Basta, MD   50 mg at 10/10/14 1610  . ipratropium-albuterol (DUONEB) 0.5-2.5 (3) MG/3ML nebulizer solution 3 mL  3 mL Inhalation Q4H PRN Vaughan Basta, MD      . levothyroxine (SYNTHROID, LEVOTHROID) tablet 100 mcg  100 mcg Oral QAC breakfast Vaughan Basta, MD   100 mcg at 10/10/14 9604  . mometasone-formoterol (DULERA) 100-5 MCG/ACT inhaler 2 puff  2 puff Inhalation BID Vaughan Basta, MD   2 puff at 10/10/14 0839  . morphine CONCENTRATE 10 MG/0.5ML oral solution 5-10 mg  5-10 mg Oral Q4H PRN Vaughan Basta, MD      . ondansetron (ZOFRAN) tablet 4 mg  4 mg Oral Q4H PRN  Vaughan Basta, MD      . oxyCODONE-acetaminophen (PERCOCET/ROXICET) 5-325 MG per tablet 1 tablet  1 tablet Oral Q6H PRN Vaughan Basta, MD      . pravastatin (PRAVACHOL) tablet 10 mg  10 mg Oral q1800 Vaughan Basta, MD      . sennosides-docusate sodium (SENOKOT-S) 8.6-50 MG tablet 2 tablet  2 tablet Oral BID Vaughan Basta, MD   2 tablet at 10/10/14 0844  . tamoxifen (NOLVADEX) tablet 20 mg  20 mg Oral Daily Vaughan Basta, MD   20 mg at 10/10/14 0837  . tiotropium (SPIRIVA) inhalation capsule 18 mcg  18 mcg Inhalation Daily Vaughan Basta, MD        PHYSICAL EXAM: Filed Vitals:   10/02/14 1432  BP: 126/63  Pulse: 79  Temp: 98.6 F (37 C)  Resp: 18     Body mass index is 28.76 kg/(m^2).     GENERAL: Patient is chronically weak, sitting in wheelchair, otherwise alert and oriented and in no acute distress. There is no icterus. HEENT: EOMs intact. No cervical  lymphadenopathy. CVS: S1S2, regular LUNGS: Bilaterally clear to auscultation, no rhonchi. ABDOMEN: Soft, nontender. No hepatomegaly clinically.  EXTREMITIES: No pedal edema.   LAB RESULTS:    Component Value Date/Time   NA 140 10/10/2014 0545   NA 141 12/28/2013 1115   K 3.7 10/10/2014 0545   K 3.8 12/28/2013 1115   CL 94* 10/10/2014 0545   CL 104 12/28/2013 1115   CO2 34* 10/10/2014 0545   CO2 26 12/28/2013 1115   GLUCOSE 109* 10/10/2014 0545   GLUCOSE 146* 12/28/2013 1115   BUN 104* 10/10/2014 0545   BUN 53* 12/28/2013 1115   CREATININE 3.54* 10/10/2014 0545   CREATININE 1.45* 06/26/2014 1401   CALCIUM 8.6* 10/10/2014 0545   CALCIUM 8.1* 12/28/2013 1115   PROT 6.1* 10/02/2014 1416   PROT 6.1* 06/26/2014 1401   ALBUMIN 2.7* 10/02/2014 1416   ALBUMIN 2.7* 06/26/2014 1401   AST 24 10/02/2014 1416   AST 24 06/26/2014 1401   ALT 28 10/02/2014 1416   ALT 28 06/26/2014 1401   ALKPHOS 81 10/02/2014 1416   ALKPHOS 76 06/26/2014 1401   BILITOT 0.4 10/02/2014 1416   BILITOT  0.3 06/26/2014 1401   GFRNONAA 12* 10/10/2014 0545   GFRNONAA 35* 06/26/2014 1401   GFRAA 13* 10/10/2014 0545   GFRAA 40* 06/26/2014 1401    Lab Results  Component Value Date   WBC 12.8* 10/10/2014   NEUTROABS 16.6* 10/09/2014   HGB 8.3* 10/10/2014   HCT 25.6* 10/10/2014   MCV 83.5 10/10/2014   PLT 162 10/10/2014    No results found for: IRON    STUDIES: 01/15/14 - PET scan. IMPRESSION:  1. Progressive enlargement of previously described left lower lobe nodule which demonstrates low level hypermetabolic activity, suspicious for neoplasm such as a primary bronchogenic adenocarcinoma. 2. However, there are also evolving adjacent areas of peribronchovascular ground-glass attenuation and nodular airspace disease in the left lower lobe, and to a lesser extent in the medial segment of the low right middle lobe, which also demonstrate low-level hypermetabolic activity. These other areas are favored to be of infectious or inflammatory etiology rather than neoplastic, but continued attention on future followup studies is recommended.  3. 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  4. Areas of hypermetabolism in the region of the vulva near the introitus, and in the region of the anal canal. These findings could simply be related to urinary contamination, however, vulvar soft tissues appear thickened. The possibility of primary malignancies in these regions are not excluded, and correlation with physical examination is suggested.  5. Atherosclerosis, including left main and 3 vessel coronary artery disease. Assessment for potential risk factor modification, dietary therapy or pharmacologic therapy may be warranted, if clinically indicated.  01/30/14 - Pathology Report. LUNG, LEFT LOWER LOBE; CT-GUIDED CORE BIOPSY: MUCINOUS ADENOCARCINOMA WITH LEPIDIC PATTERN.   COMMENT: In this sample the tumor is  histologically similar to the previously biopsied mass in left upper lobe ([id number],03/26/13), which was reviewed. Tumors of this type have a strong tendency for multicentric, multilobar, and bilateral lung involvement. In additional, metastatic adenocarcinoma of non-pulmonary origin could have this histologic appearance, so clinical correlation is needed.  03/07/14 - Mammogram. IMPRESSION: No mammographic evidence of malignancy. RECOMMENDATION: Recommend annual diagnostic mammograms. BI-RADS CATEGORY  2: Benign.  06/24/14 - CT chest without contrast. IMPRESSION:  1. Left lower lobe nodule is stable and remains worrisome for primary bronchogenic carcinoma. 2.  Resolved right middle lobe subpleural airspace consolidation, indicative of an infectious or inflammatory etiology. 3. Scattered peribronchovascular ground-glass densities in the right lung are new and likely infectious or inflammatory in etiology. 4. Post radiation changes in the lingula and left lower lobe. 5. Extensive 3 vessel coronary artery calcification. 6. Mixed attenuation extrapleural mass in the apex of the right hemithorax, as on prior exams, indicative of a benign lesion such as a neurogenic tumor.  08/28/14 - CT Chest wo contrast. IMPRESSION:  1. Chronic nodular and reticular nodular airspace opacities and interstitial opacities in the left lower lobe and lingula suggests chronic infection. No significant change from CT of 06/24/2014.  2. Mild branching nodular airspace disease in the right lower lobe is new. This could represent early or resolving pneumonia.  3. 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  09/19/14 - PET Scan.  FINDINGS:  NECK  No hypermetabolic lymph nodes in the neck. CHEST  Right paratracheal lymph node measures 1.1 cm and has an SUV max equal to 3.26. Previously this lymph node measured 0.9 cm and had an SUV max equal to 2.7. Moderate volume left  pleural effusion identified. Overlying compressive type atelectasis is noted. Left lower lobe lung nodule is identified posteriorly measuring 1.8 cm. This has an SUV max equal to 3.53. Stable appearance of subpleural nodule within the posterior medial right apex measuring 3.3 cm, image 55/series 3. No significant FDG uptake is within this structure. ABDOMEN/PELVIS  Small nonspecific focus of increased uptake within the lower rectum/anus has an SUV max equal to 10.0. This is compared with 5.2 previously. There is no abnormal hyper metabolism within the liver, spleen, adrenal glands, or pancreas. The visualized portions of stress that aortic atherosclerosis noted. No hypermetabolic upper abdominal or pelvic lymph nodes identified. SKELETON  No focal hypermetabolic activity to suggest skeletal metastasis. IMPRESSION:   1. Mild increase in size and degree of FDG uptake associated with right paratracheal lymph node. Additionally, there is a new left pleural effusion and a persistent focus of hyper metabolism within the posterior left lung base. Residual tumor cannot be excluded. 2. Stable non hypermetabolic low density, subpleural mass within the posterior medial right apex. 3. Intense focus of increased uptake is identified within the lower rectum/anus area. Correlation with direct visualization would be advised.   ASSESSMENT / PLAN:   1. Non-small cell lung cancer. Biopsy positivie for Adenocarcinoma with lepidic features in January 2015. Completed radiation therapy (SBRT) in March 2015 - PET scan shows abnormal activity in left lower lung area. 01/30/14 - Pathology Report. LUNG, LEFT LOWER LOBE; CT-GUIDED CORE BIOPSY: MUCINOUS ADENOCARCINOMA WITH LEPIDIC PATTERN. EGFR, ALK, ROS-1, RET all negative. Completed course of radiation early January 2016 - reviewed recent PET scan and d/w patient, it reports mildly increased hypermetabolic activity in left lower lung and right paratracheal node, this could be from  residual/recurrent malignancy versus recent respiratory tract infection. Patient is not a candidate for systemic therapy like chemotherapy (not a candidate for targeted therapy since molecular tests were negative). Per d/w Dr.Chrystal today, no plans for more radiation and recommends monitoring. Patient is agreeable to this approach. She does understand that overall prognosis is poor if she has recurrent lung cancer along with multiple other co-morbidities. States she is planning to d/w Dr.Hande and Dr.Fleming if her COPD is severe enough for placement in SNF or pursue hospice from COPD standpoint. Will see her back at 12 weeks for followup. 2.  Abnormal PET activity in vulvar area - s/p gyn-oncologist evaluation, felt not a significant finding to indicate malignancy. 3. History of Breast Ca Stage IA, ER+/PR-, Her2neu neg - on hormonal therapy with tamoxifen. Review of DEXA scan in January 2013 showed osteoporosis with a T-score of -2.9. Have discussed continuing hormonal therapy options including aromatase inhibitor, given h/o osteoporosis, patient prefers to stay on tamoxifen 20 mg by mouth daily. Recent mammogram January was reported benign, BI-RADS 2. Plan is continued surveillance. 4. Renal insufficiency - creatinine higher at 2.98, states she is seeing nephrology. Encouraged her to maintain adequate oral fluid intake. In between visits, patient advised to call or come to ER in case of any new symptoms or acute sickness. She is agreeable to this plan.    Leia Alf, MD   10/10/2014 9:33 AM

## 2014-10-10 NOTE — Plan of Care (Signed)
Problem: Discharge Progression Outcomes Goal: Discharge plan in place and appropriate Individualization of care  Outcome: Progressing Pts VSS, A&OX4, no c/o pain, urine sample sent. Pt with no fall or no injury. Will continue to monitor.

## 2014-10-10 NOTE — Clinical Social Work Note (Signed)
Clinical Social Work Assessment  Patient Details  Name: Whitney Wright MRN: [id number] Date of Birth: 01-26-1941  Date of referral:  10/10/14               Reason for consult:  Facility Placement                Permission sought to share information with:  Family Supports Permission granted to share information::  Yes, Verbal Permission Granted  Name::      (daughter, Coralyn Mark and [place], nephew, Dominica Severin)   Housing/Transportation Living arrangements for the past 2 months:  [place] of Information:  Adult Children Patient Interpreter Needed:  None Criminal Activity/Legal Involvement Pertinent to Current Situation/Hospitalization:   SNF Significant Relationships:  Adult Children, Other Family Members Lives with:  Facility Resident Do you feel safe going back to the place where you live?  Yes Need for family participation in patient care:  Yes (Comment)  Care giving concerns:  None reported   Facilities manager / plan:  CSW met with pt's daughter and nephew (POA) to address consult. CSW introduced herself and explained role of social work. Pt was admitted from [facility], however the plan is for pt to go to [facility] for LTC. Pt's family has applied for Medicaid. CSW spoke with [facility] and they are able to accept pt. CSW will obtain [facility] auth for placement, which is needed for [facility] to accept pt. CSW updated [employer]   Pt's family is in agreement. Pt's will pick up pt's belongings at [facility] in the morning.   CSW will complete FL2 and send referral to [facility]. CSW spoke with MD regarding PT order.   CSW will continue to follow.   Employment status:  Retired Forensic scientist:  Other (Comment Required), Managed Medicare ([place] Medicaid is pending) PT Recommendations:  Not assessed at this time Information / Referral to community resources:  [place]  Patient/Family's Response to care:  Pt's  family was very Patent attorney of CSW support. They will update pt.   Patient/Family's Understanding of and Emotional Response to Diagnosis, Current Treatment, and Prognosis:  Pt's family understands that pt needs LTC at [facility].   Emotional Assessment Appearance:  Appears stated age Attitude/Demeanor/Rapport:   (appropriate) Affect (typically observed):  Accepting Orientation:  Oriented to Self, Oriented to Place, Oriented to  Time, Oriented to Situation Alcohol / Substance use:  Never Used Psych involvement (Current and /or in the community):   No  Discharge Needs  Concerns to be addressed:  Other (Comment Required (LTC) Readmission within the last 30 days:  Yes Current discharge risk:  None Barriers to Discharge:  Barriers Resolved   Darden Dates, LCSW 10/10/2014, 4:41 PM

## 2014-10-10 NOTE — Progress Notes (Signed)
Initial Nutrition Assessment   INTERVENTION:   Meals and Snacks: Cater to patient preferences Medical Food Supplement Therapy: recommend continuing Glucerna Shake po BID, each supplement provides 220 kcal and 10 grams of protein as pt reports drinking outpatient.   NUTRITION DIAGNOSIS:   No nutrition diagnosis at this time  GOAL:   Patient will meet greater than or equal to 90% of their needs  MONITOR:    (Energy Intake, Electrolyte and renal Profile, Glucose Profile, Anthropometrics)  REASON FOR ASSESSMENT:   Diagnosis    ASSESSMENT:   Pt admitted with acute on chronic renal failure and SOB. Pt admitted 2 weeks prior with respiratory failure and volume overload with weight gain.  Past Medical History  Diagnosis Date  . Hypertension   . Cellulitis and abscess of trunk   . Thyroid disease     hypo  . Arthritis   . Personal history of malignant neoplasm of breast   . Diabetes mellitus without complication   . Senile osteoporosis   . Malignant neoplasm of upper-outer quadrant of female breast January 25, 2011    Extensive intraductal carcinoma with focal ( 29m) invasive cancer, T1a, N0, M0. ER 50%, PR 0, HER-2/neu nonamplified.. Tamoxifen chosen as bone density showed severe osteoporosis.  . Lung cancer, upper lobe February 2015    Adenocarcinoma with lepidic pattern, second primary fall 2015  . Breast cancer   . COPD (chronic obstructive pulmonary disease)   . Shortness of breath dyspnea   . Chronic kidney disease      Diet Order:  Diet heart healthy/carb modified Room service appropriate?: Yes; Fluid consistency:: Thin    Current Nutrition: Pt reports eating 100% of breakfast this am.   Food/Nutrition-Related History: Pt reports good appetite PTA eating 2-3 meals per day at rehab facility. Pt reports sometimes breakfast and lunch are too close together so she may eat less of lunch some days. Pt reports drinking Glucerna shake 1-2 times daily.   Medications:  NS at 757mhr, Senokot, novolog  Electrolyte/Renal Profile and Glucose Profile:   Recent Labs Lab 10/09/14 1116 10/10/14 0545  NA 132* 140  K 4.5 3.7  CL 86* 94*  CO2 32 34*  BUN 130* 104*  CREATININE 4.11* 3.54*  CALCIUM 8.7* 8.6*  GLUCOSE 276* 109*   Protein Profile: No results for input(s): ALBUMIN in the last 168 hours.  Gastrointestinal Profile: Last BM:  10/08/2014  Skin:  Reviewed, no issues  Nutrition-Focused Physical Exam Findings: Nutrition-Focused physical exam completed. Findings are WDL for fat depletion, muscle depletion, and edema.    Weight Change: Pt reports UBW of 135-145lbs. RD notes 2 weeks ago pt admitted with weight gain of 14lbs [facility]  RD also notes a weight of 150lbs 8 months ago (5% weight loss in 8 months)   Height:   Ht Readings from Last 1 Encounters:  10/09/14 5' (1.524 m)    Weight:   Wt Readings from Last 1 Encounters:  10/09/14 142 lb 8 oz (64.638 kg)   Wt Readings from Last 10 Encounters:  10/09/14 142 lb 8 oz (64.638 kg)  10/02/14 147 lb 4.3 oz (66.8 kg)  09/21/14 157 lb 8 oz (71.442 kg)  08/21/14 155 lb (70.308 kg)  08/17/14 155 lb (70.308 kg)  03/12/14 150 lb (68.04 kg)  03/15/13 154 lb (69.854 kg)  08/28/12 150 lb (68.04 kg)    BMI:  Body mass index is 27.83 kg/(m^2).  EDUCATION NEEDS:   No education needs  identified at this time   [place], RD, LDN Pager [phone]

## 2014-10-10 NOTE — Plan of Care (Addendum)
Problem: Discharge Progression Outcomes Goal: Discharge plan in place and appropriate Individualization of care  Outcome: Progressing Individualization of Care Hx of HTN, cellulitis, thyroid disease, arthritis, DM, breast and lung cancer, COPD, CKD, osteoporosis.  Admitted for Acute/Chronic Renal Failure    Patient admitted for Acute/chronic Renal Failure from [facility] where she was recovering from Pneumonia. 1.  Patient uses chronic O2 per [place] - recently increase to 6L.  [place] [facility] Neph[facility] consult ordered. 2.  Pain:  Patient has had no complaints of pain this shift. 3.  Hemodynamics:  Patient on 6L of O2.  On .9 Normal Saline at 75 mL/hr.  BP 169/59 mmHg  Pulse 92  Temp(Src) 98.2 F (36.8 C) (Oral)  Resp 15  Ht 5' (1.524 m)  Wt 142 lb 8 oz (64.638 kg)  BMI 27.83 kg/m2  SpO2 100% 4.  Diet:  Patient on heart healthy/carb modified diet.  She ate after admittance to floor and tolerated well. 5.  Activity:  Patient up with minimal assist to [facility].  She uses a walker at home.  Slight weakness and unsteady gait.  Fall precautions observed.

## 2014-10-10 NOTE — Progress Notes (Signed)
PROGRESS NOTE  Whitney Wright CLE:[id number] DOB: 12/02/1940 DOA: 10/09/2014 PCP: Whitney Harrier, MD  Subjective:  74 y/o f with hx of Ca Lung  HTN, OA,Type 2 DM, CKD recently discharged to SNF returns to ED with fatigue, tiredness and dry mouth. Pt was noted to be in Acute Renal failure with Se creat of 4.11 (Baseline Creat; 1.71). This am feels slightly better . Se Creat improved to 3.54   Objective: BP 154/64 mmHg  Pulse 80  Temp(Src) 97.5 F (36.4 C) (Oral)  Resp 15  Ht 5' (1.524 m)  Wt 64.638 kg (142 lb 8 oz)  BMI 27.83 kg/m2  SpO2 96%  Intake/Output Summary (Last 24 hours) at 10/10/14 1303 Last data filed at 10/10/14 1108  Gross per 24 hour  Intake 1033.75 ml  Output    500 ml  Net 533.75 ml   Filed Weights   10/09/14 2033  Weight: 64.638 kg (142 lb 8 oz)    Exam:  HEENT: [place], AT.  General:  In mild distress  Cardiovascular: S1 S2   Respiratory: Clear to auscultation  Abdomen: Soft. Non Tender  Neuro:Non Focal   Data Reviewed: Basic Metabolic Panel:  Recent Labs Lab 10/09/14 1116 10/10/14 0545  NA 132* 140  K 4.5 3.7  CL 86* 94*  CO2 32 34*  GLUCOSE 276* 109*  BUN 130* 104*  CREATININE 4.11* 3.54*  CALCIUM 8.7* 8.6*   Liver Function Tests: No results for input(s): AST, ALT, ALKPHOS, BILITOT, PROT, ALBUMIN in the last 168 hours. No results for input(s): LIPASE, AMYLASE in the last 168 hours. No results for input(s): AMMONIA in the last 168 hours. CBC:  Recent Labs Lab 10/09/14 1223 10/10/14 0545  WBC 17.7* 12.8*  NEUTROABS 16.6*  --   HGB 9.0* 8.3*  HCT 27.9* 25.6*  MCV 83.0 83.5  PLT 180 162   Cardiac Enzymes:    Recent Labs Lab 10/09/14 1116  TROPONINI 0.03   BNP (last 3 results)  Recent Labs  08/17/14 1006 09/15/14 1744 10/09/14 1223  BNP 390.0* 558.0* 359.0*    ProBNP (last 3 results) No results for input(s): PROBNP in the last 8760 hours.  CBG:  Recent Labs Lab 10/10/14 0749 10/10/14 1135   GLUCAP 112* 243*    Recent Results (from the past 240 hour(s))  MRSA PCR Screening     Status: None   Collection Time: 10/09/14 11:43 PM  Result Value Ref Range Status   MRSA by PCR NEGATIVE NEGATIVE Final    Comment:        The GeneXpert MRSA Assay (FDA approved for NASAL specimens only), is one component of a comprehensive MRSA colonization surveillance program. It is not intended to diagnose MRSA infection nor to guide or monitor treatment for MRSA infections.      Studies: US Renal  10/09/2014   CLINICAL DATA:  Acute on chronic renal failure.  Initial encounter.  EXAM: RENAL / URINARY TRACT ULTRASOUND COMPLETE  COMPARISON:  PET-CT 09/19/2014.  FINDINGS: Right Kidney:  Length: 9.2 cm. Echogenicity within normal limits. No mass or hydronephrosis visualized.  Left Kidney:  Length: 6.9 cm. There is cortical thinning and increased echogenicity. No hydronephrosis or focal cortical lesion identified.  Bladder:  Appears normal for degree of bladder distention.  IMPRESSION: 1. The left kidney is atrophied with cortical thinning, likely chronic. 2. No evidence of hydronephrosis.   Electronically Signed   By: Whitney Wright M.D.   On: 10/09/2014 18:42    Scheduled Meds: . aspirin  EC  81 mg Oral Daily  . chlorpheniramine-HYDROcodone  5 mL Oral BID  . GLUCERNA  1 Can Oral BID BM  . guaiFENesin  600 mg Oral BID  . heparin  5,000 Units Subcutaneous 3 times per day  . hydrALAZINE  50 mg Oral BID  . insulin aspart  0-15 Units Subcutaneous TID AC & HS  . levothyroxine  100 mcg Oral QAC breakfast  . mometasone-formoterol  2 puff Inhalation BID  . pravastatin  10 mg Oral q1800  . sennosides-docusate sodium  2 tablet Oral BID  . tamoxifen  20 mg Oral Daily  . tiotropium  18 mcg Inhalation Daily    Continuous Infusions: . sodium chloride 75 mL/hr at 10/10/14 1108    Assessment/Plan: 1  Acute on chronic renal failure secondary to over diuresis from Torsemide. Continue IV Hydration and  monitor Renal unction closely 2 COPD; Continue SVN's and Spiriva  3 Ca lung: Oncology following - Morphine for pain 4 Type 2 DM ; Continue Novolog 5 HTN: Continue Hydralazine    Code Status: DNR       Whitney Wright   10/10/2014, 1:03 PM  LOS: 1 day

## 2014-10-10 NOTE — Care Management (Signed)
Admitted to [facility] with acute on chronic renal failure. Discharged from this facility to [facility] 09/21/14. Daughter is Delene Loll [phone]). History of Breast/Lung cancer. Sees Dr. Ginette Pitman as private care physician, but sees Dr. Ouida Sills at [facility]. BUN 130, Creatinine 4.11on admission.  Shelbie Ammons RN MSN Care Management [phone]

## 2014-10-10 NOTE — Clinical Documentation Improvement (Signed)
10/09/14 H&P..."* Acute on chronic renal failure"..."We'll give gentle IV fluid and follow her renal function. Her baseline creatinine was 1.7 as per July 2016 hospital recordsAvoid nephrotoxic medications. Ultrasound kidney and nephrology consult.".Marland KitchenMarland Kitchen For accurate DX specificity & severity can noted " CKD" be further specified w/ stage. Thank you  Ref. Range 10/09/2014 11:16 10/10/2014 05:45  BUN Latest Ref Range: 6-20 mg/dL 130 (H) 104 (H)  Creatinine Latest Ref Range: 0.44-1.00 mg/dL 4.11 (H) 3.54 (H)    Ref. Range 10/09/2014 11:16 10/10/2014 05:45  EGFR (Non-African Amer.) Latest Ref Range: >60 mL/min 10 (L) 12 (L)   . Document the stage of CKD --Chronic kidney disease, stage 1- GFR > OR = 90 --Chronic kidney disease, stage 2 (mild) - GFR 60-89 --Chronic kidney disease, stage 3 (moderate) - GFR 30-59 --Chronic kidney disease, stage 4 (severe) - GFR 15-29 --Chronic kidney disease, stage 5- GFR < 15 --End-stage renal disease (ESRD) . Document any underlying cause of CKD such as Diabetes orHypertension . Chronic renal failure without a documented stage will be assigned toChronic kidney disease, unspecified . Document any associated diagnoses/conditions  Supporting Information:  Thank You,  Ermelinda Das, RN, BSN, CCDS Certified Clinical Documentation Specialist Pager: [place]: Health Information Management

## 2014-10-10 NOTE — Clinical Documentation Improvement (Signed)
10/09/14 H&P..."last month in hospital- was admitted to hospital for respiratory failure and COPD and was discharged back to her rehabilitation place..."..Marland Kitchen"She is on chronic oxygen.".Marland Kitchen."continue inhalers and nebulizers at home"... TX:O2 @ 2L/min [place] prn, continuous O2 sat monioring tiotropium (SPIRIVA) inhalation capsule 18 mcg 1  18 mcg capsule Inhalation Daily   For accurate DX specificity & severity can noted findings be linked to possible, probable, suspected chronic condition being eval'd, mon'd & tx'd. Thank you . Document acuity:  *Respiratory falire --Acute --Chronic --Acute on chronic . Document inclusion of: --Hypoxia --Hypercapnia . Document tobacco: --Use --Abuse --History of . Document any associated diagnoses/conditions  Supporting Information:  Thank You,  Ermelinda Das, RN, BSN, CCDS Certified Clinical Documentation Specialist Pager: [place]: Health Information Management

## 2014-10-10 NOTE — Consult Note (Signed)
Date: 10/10/2014                  Patient Name:  Whitney Wright  MRN: [id number]  DOB: 1940/09/05  Age / Sex: 74 y.o., female         PCP: Tracie Harrier, MD                 Service Requesting Consult:  internal medicine                  Reason for Consult:  acute renal failure             History of Present Illness: Patient is a 74 y.o. female with medical problems of hypertension, breast cancer, diabetes, osteoporosis, adenocarcinoma of the lung, COPD, chronic kidney disease, who was admitted to [facility] on 10/09/2014 for evaluation of shortness of breath.. She was found to have worsening of creatinine. She was admitted for further evaluation and management.  Baseline creatinine appears to be 2.05/GFR of 22 from 09/10/2014. Admit creatinine on August 10 was 4.11. Albumin is noted to be low at 2.7. Hemoglobin 10.3. The WBC count was elevated at 15.5. Today's creatinine is 3.54. BUN level has improved slightly from 130 down to 104 Workup so far has included a renal ultrasound which is negative for obstruction  Review of medications suggest patient was taking high dose of torsemide, hydralazine, metolazone.  Medications: Outpatient medications: Prescriptions prior to admission  Medication Sig Dispense Refill Last Dose  . acetaminophen (TYLENOL) 325 MG tablet Take 650 mg by mouth every 4 (four) hours as needed for mild pain, moderate pain or fever.   prn at prn  . ADVAIR DISKUS 250-50 MCG/DOSE AEPB Inhale 1 puff into the lungs 2 (two) times daily.   5 unknown at unknown  . albuterol (PROVENTIL HFA;VENTOLIN HFA) 108 (90 BASE) MCG/ACT inhaler Inhale 2 puffs into the lungs every 6 (six) hours as needed for wheezing or shortness of breath.    prn at prn  . ALPRAZolam (XANAX) 0.25 MG tablet Take 0.25 mg by mouth at bedtime as needed for anxiety.   3 prn at prn  . Alum & Mag Hydroxide-Simeth (MAG-AL PLUS XS PO) Take 30 mLs by mouth every 4 (four) hours as needed (for gas/ingestion.).   prn  at prn  . Amino Acids-Protein Hydrolys (FEEDING SUPPLEMENT, PRO-STAT SUGAR FREE 64,) LIQD Take 30 mLs by mouth 3 (three) times daily with meals.   unknown at unknown  . aspirin EC 81 MG tablet Take 81 mg by mouth daily.   unknown at unknown  . bisacodyl (DULCOLAX) 10 MG suppository Place 10 mg rectally daily as needed for moderate constipation.   prn at prn  . calcium carbonate (OS-CAL) 600 MG TABS tablet Take 600 mg by mouth 2 (two) times daily with a meal.   unknown at unknown  . calcium carbonate (TUMS - DOSED IN MG ELEMENTAL CALCIUM) 500 MG chewable tablet Chew 2 tablets by mouth every 4 (four) hours as needed for indigestion.   prn at prn  . chlorpheniramine-HYDROcodone (TUSSIONEX PENNKINETIC ER) 10-8 MG/5ML SUER Take 5 mLs by mouth 2 (two) times daily. 140 mL 0 unknown at unknown  . Cholecalciferol 1000 UNITS TBDP Take 1,000 Units by mouth daily.    unknown at unknown  . GLUCERNA (GLUCERNA) LIQD Take 1 Can by mouth 2 (two) times daily between meals.   unknown at unknown  . guaiFENesin (MUCINEX) 600 MG 12 hr tablet Take 600 mg by mouth  2 (two) times daily.   unknown at unknown  . hydrocortisone cream 1 % Apply 1 application topically 4 (four) times daily as needed (for dry hemorrhoids.).   prn at prn  . insulin NPH Human (HUMULIN N,NOVOLIN N) 100 UNIT/ML injection Inject into the skin 2 (two) times daily. Use as directed twice daily.   unknown at unknown  . ipratropium-albuterol (DUONEB) 0.5-2.5 (3) MG/3ML SOLN Inhale 3 mLs into the lungs See admin instructions. Use 1 vial (12m) via nebulizer every 8 hours, and 1 vial (331m) via nebulizer every 4 hours as needed for wheezing /shortness of breath.  5 unknown at unknown  . levothyroxine (SYNTHROID, LEVOTHROID) 100 MCG tablet Take 100 mcg by mouth daily before breakfast. Take along witg 88 mcg tablet for total dose of 188 mcg.   unknown at unknown  . levothyroxine (SYNTHROID, LEVOTHROID) 88 MCG tablet Take 88 mcg by mouth daily before breakfast. Take  along with 100 mcg tablet for a total dose of 188 mcg.   unknown at unknown  . lovastatin (MEVACOR) 20 MG tablet Take 1 tablet by mouth daily.   unknown at unknown  . magnesium hydroxide (MILK OF MAGNESIA) 400 MG/5ML suspension Take 30 mLs by mouth daily as needed for mild constipation or moderate constipation.   prn at prn  . metolazone (ZAROXOLYN) 5 MG tablet Take 5 mg by mouth every other day.   unknown at unknown  . morphine (ROXANOL) 20 MG/ML concentrated solution Take 5-10 mg by mouth See admin instructions. Take 40m66m0.240m77mvery 4 hours. May take 5-10mg540m240ml-61ml) every hour as needed for pain or dyspnea   unknown at unknown  . ondansetron (ZOFRAN) 4 MG tablet Take 4 mg by mouth every 4 (four) hours as needed for nausea.   prn at prn  . oxyCODONE-acetaminophen (PERCOCET/ROXICET) 5-325 MG per tablet Take 1 tablet by mouth every 6 (six) hours as needed (chest pain). 30 tablet 0 prn at prn  . potassium chloride (MICRO-K) 10 MEQ CR capsule Take 10 mEq by mouth daily.   unknown at unknown  . predniSONE (DELTASONE) 20 MG tablet Take 20 mg by mouth See admin instructions. Take 3 tabs daily for 5 days, then 2 tabs for 5 days and then 1 daily continuous or until weaned further per orders   unknown at unknown  . sennosides-docusate sodium (SENOKOT-S) 8.6-50 MG tablet Take 2 tablets by mouth 2 (two) times daily.   unknown at unknown  . tamoxifen (NOLVADEX) 10 MG tablet Take 20 mg by mouth daily.   unknown at unknown  . tiotropium (SPIRIVA) 18 MCG inhalation capsule Place 18 mcg into inhaler and inhale daily.   unknown at unknown  . torsemide (DEMADEX) 20 MG tablet Take 40 mg by mouth daily.    unknown at unknown  . hydrALAZINE (APRESOLINE) 50 MG tablet Take 1 tablet (50 mg total) by mouth 2 (two) times daily. (Patient not taking: Reported on 10/09/2014) 60 tablet 3 Not Taking at Unknown time  . levofloxacin (LEVAQUIN) 250 MG tablet Take 1 tablet (250 mg total) by mouth daily. (Patient not taking:  Reported on 10/09/2014) 14 tablet 0 Completed Course at Unknown time    Current medications: Current Facility-Administered Medications  Medication Dose Route Frequency Provider Last Rate Last Dose  . 0.9 %  sodium chloride infusion   Intravenous Continuous VaibhaVaughan Basta5 mL/hr at 10/09/14 2121    . albuterol (PROVENTIL) (2.5 MG/3ML) 0.083% nebulizer solution 2.5 mg  2.5 mg Inhalation Q6H PRN  Vaughan Basta, MD      . ALPRAZolam Duanne Moron) tablet 0.25 mg  0.25 mg Oral QHS PRN Vaughan Basta, MD      . aspirin EC tablet 81 mg  81 mg Oral Daily Vaughan Basta, MD   81 mg at 10/10/14 0839  . bisacodyl (DULCOLAX) suppository 10 mg  10 mg Rectal Daily PRN Vaughan Basta, MD      . calcium carbonate (TUMS - dosed in mg elemental calcium) chewable tablet 400 mg of elemental calcium  2 tablet Oral Q4H PRN Vaughan Basta, MD      . chlorpheniramine-HYDROcodone (TUSSIONEX) 10-8 MG/5ML suspension 5 mL  5 mL Oral BID Vaughan Basta, MD   5 mL at 10/10/14 0839  . GLUCERNA (GLUCERNA) liquid 1 Can  1 Can Oral BID BM Vaughan Basta, MD      . guaiFENesin (MUCINEX) 12 hr tablet 600 mg  600 mg Oral BID Vaughan Basta, MD   600 mg at 10/10/14 0837  . heparin injection 5,000 Units  5,000 Units Subcutaneous 3 times per day Vaughan Basta, MD   5,000 Units at 10/10/14 0554  . hydrALAZINE (APRESOLINE) tablet 50 mg  50 mg Oral BID Vaughan Basta, MD   50 mg at 10/10/14 4431  . insulin aspart (novoLOG) injection 0-15 Units  0-15 Units Subcutaneous TID AC & HS Vishwanath Hande, MD      . ipratropium-albuterol (DUONEB) 0.5-2.5 (3) MG/3ML nebulizer solution 3 mL  3 mL Inhalation Q4H PRN Vaughan Basta, MD      . levothyroxine (SYNTHROID, LEVOTHROID) tablet 100 mcg  100 mcg Oral QAC breakfast Vaughan Basta, MD   100 mcg at 10/10/14 5400  . mometasone-formoterol (DULERA) 100-5 MCG/ACT inhaler 2 puff  2 puff Inhalation BID  Vaughan Basta, MD   2 puff at 10/10/14 0839  . morphine CONCENTRATE 10 MG/0.5ML oral solution 5-10 mg  5-10 mg Oral Q4H PRN Vaughan Basta, MD      . ondansetron (ZOFRAN) tablet 4 mg  4 mg Oral Q4H PRN Vaughan Basta, MD      . oxyCODONE-acetaminophen (PERCOCET/ROXICET) 5-325 MG per tablet 1 tablet  1 tablet Oral Q6H PRN Vaughan Basta, MD      . pravastatin (PRAVACHOL) tablet 10 mg  10 mg Oral q1800 Vaughan Basta, MD      . sennosides-docusate sodium (SENOKOT-S) 8.6-50 MG tablet 2 tablet  2 tablet Oral BID Vaughan Basta, MD   2 tablet at 10/10/14 0844  . tamoxifen (NOLVADEX) tablet 20 mg  20 mg Oral Daily Vaughan Basta, MD   20 mg at 10/10/14 0837  . tiotropium (SPIRIVA) inhalation capsule 18 mcg  18 mcg Inhalation Daily Vaughan Basta, MD          Allergies: Allergies  Allergen Reactions  . Hydrocodone-Acetaminophen Rash  . Macrodantin [Nitrofurantoin] Rash      Past Medical History: Past Medical History  Diagnosis Date  . Hypertension   . Cellulitis and abscess of trunk   . Thyroid disease     hypo  . Arthritis   . Personal history of malignant neoplasm of breast   . Diabetes mellitus without complication   . Senile osteoporosis   . Malignant neoplasm of upper-outer quadrant of female breast January 25, 2011    Extensive intraductal carcinoma with focal ( 51m) invasive cancer, T1a, N0, M0. ER 50%, PR 0, HER-2/neu nonamplified.. Tamoxifen chosen as bone density showed severe osteoporosis.  . Lung cancer, upper lobe February 2015    Adenocarcinoma with lepidic pattern, second  primary fall 2015  . Breast cancer   . COPD (chronic obstructive pulmonary disease)   . Shortness of breath dyspnea   . Chronic kidney disease      Past Surgical History: Past Surgical History  Procedure Laterality Date  . Abdominal hysterectomy  age 35  . Mammosite balloon placement Left 2012  . Cardiac catheterization  2014  . Breast  surgery Left January 11, 2011    left breast stereotactic biopsy: DCIS  . Appendectomy    . Flexible sigmoidoscopy N/A 09/20/2014    Procedure: FLEXIBLE SIGMOIDOSCOPY;  Surgeon: Manya Silvas, MD;  Location: [facility] ENDOSCOPY;  Service: Endoscopy;  Laterality: N/A;     Family History: Family History  Problem Relation Age of Onset  . Breast cancer Daughter   . Breast cancer Mother   . Diabetes Mother   . Hypertension Mother   . Hypertension Sister      Social History: Social History   Social History  . Marital Status: Widowed    Spouse Name: N/A  . Number of Children: N/A  . Years of Education: N/A   Occupational History  . Not on file.   Social History Main Topics  . Smoking status: Former Smoker -- 0.00 packs/day for 30 years  . Smokeless tobacco: Never Used  . Alcohol Use: No  . Drug Use: No  . Sexual Activity: Not on file   Other Topics Concern  . Not on file   Social History Narrative     Review of Systems: Gen: Denies any fever, chills, anorexia, fatigue, HEENT: No visual complaints, No Epistaxis or Sore throat. No sinusitis.  CV: Denies chest pain, angina, palpitations, syncope, orthopnea, PND, peripheral edema, and claudication. Resp: + cough, sputum, wheezing, No coughing up blood, or pleurisy. GI: Denies vomiting blood, jaundice, and melena. Denies dysphagia or odynophagia. GU : Denies urinary burning, blood in urine, urinary frequency, urinary hesitancy, MS: Denies joint pain, limitation of movement,  Derm: Denies rash, itching, unhealing ulcers.  Psych: Denies depression, anxiety, memory loss, Heme: Denies bruising, bleeding, and enlarged lymph nodes. Neuro: No headache. No diplopia. No dysarthria. No dysphasia.  Endocrine No DM. No Thyroid disease. .   Vital Signs: Blood pressure 154/64, pulse 80, temperature 97.5 F (36.4 C), temperature source Oral, resp. rate 15, height 5' (1.524 m), weight 64.638 kg (142 lb 8 oz), SpO2 96  %.   Intake/Output Summary (Last 24 hours) at 10/10/14 1104 Last data filed at 10/10/14 1058  Gross per 24 hour  Intake      0 ml  Output    500 ml  Net   -500 ml    Weight trends: [employer]   10/09/14 2033  Weight: 64.638 kg (142 lb 8 oz)    Physical Exam: General:  chronically ill-appearing, laying in the bed   HEENT  anicteric sclerae, moist mucous membranes, pupils are round   Neck:  supple, no masses   Lungs:  scattered rhonchi, no wheezing, supplemental oxygen, normal effort   Heart::  irregular rhythm, no rub or gallop   Abdomen:  soft, nondistended, nontender   Extremities:  trace peripheral edema   Neurologic:  alert, oriented, nonfocal   Skin:  no acute rashes   Access:   Foley:        Lab results: Basic Metabolic Panel:  Recent Labs Lab 10/09/14 1116 10/10/14 0545  NA 132* 140  K 4.5 3.7  CL 86* 94*  CO2 32 34*  GLUCOSE 276* 109*  BUN  130* 104*  CREATININE 4.11* 3.54*  CALCIUM 8.7* 8.6*    Liver Function Tests: No results for input(s): AST, ALT, ALKPHOS, BILITOT, PROT, ALBUMIN in the last 168 hours. No results for input(s): LIPASE, AMYLASE in the last 168 hours. No results for input(s): AMMONIA in the last 168 hours.  CBC:  Recent Labs Lab 10/09/14 1223 10/10/14 0545  WBC 17.7* 12.8*  NEUTROABS 16.6*  --   HGB 9.0* 8.3*  HCT 27.9* 25.6*  MCV 83.0 83.5  PLT 180 162    Cardiac Enzymes:  Recent Labs Lab 10/09/14 1116  TROPONINI 0.03    BNP: Invalid input(s): POCBNP  CBG:  Recent Labs Lab 10/10/14 0749  GLUCAP 112*    Microbiology: Recent Results (from the past 720 hour(s))  Culture, blood (routine x 2)     Status: None   Collection Time: 09/15/14  5:44 PM  Result Value Ref Range Status   Specimen Description BLOOD RIGHT ASSIST CONTROL  Final   Special Requests   Final    BOTTLES DRAWN AEROBIC AND ANAEROBIC  AER 6CC ANA [place]   Culture NO GROWTH 5 DAYS  Final   Report Status 09/20/2014 FINAL  Final  Culture,  blood (routine x 2)     Status: None   Collection Time: 09/15/14  5:47 PM  Result Value Ref Range Status   Specimen Description BLOOD RIGHT FATTY CASTS  Final   Special Requests   Final    BOTTLES DRAWN AEROBIC AND ANAEROBIC  AER 5CC ANA [place]   Culture NO GROWTH 5 DAYS  Final   Report Status 09/20/2014 FINAL  Final  Culture, expectorated sputum-assessment     Status: None   Collection Time: 09/18/14  1:11 PM  Result Value Ref Range Status   Specimen Description EXPECTORATED SPUTUM  Final   Special Requests NONE  Final   Sputum evaluation   Final    Sputum specimen not acceptable for testing.  Please recollect.     Report Status 09/18/2014 FINAL  Final  MRSA PCR Screening     Status: None   Collection Time: 10/09/14 11:43 PM  Result Value Ref Range Status   MRSA by PCR NEGATIVE NEGATIVE Final    Comment:        The GeneXpert MRSA Assay (FDA approved for NASAL specimens only), is one component of a comprehensive MRSA colonization surveillance program. It is not intended to diagnose MRSA infection nor to guide or monitor treatment for MRSA infections.      Coagulation Studies: No results for input(s): LABPROT, INR in the last 72 hours.  Urinalysis:  Recent Labs  10/09/14 1823  COLORURINE STRAW*  LABSPEC 1.011  PHURINE 7.0  GLUCOSEU 150*  HGBUR NEGATIVE  BILIRUBINUR NEGATIVE  KETONESUR NEGATIVE  PROTEINUR 100*  NITRITE NEGATIVE  LEUKOCYTESUR NEGATIVE      Imaging: Dg Chest 2 View  10/09/2014   CLINICAL DATA:  Shortness of breath with difficulty breathing. History of COPD and breast cancer Initial encounter.  EXAM: CHEST  2 VIEW  COMPARISON:  Radiographs 09/16/2014 and 09/15/2014.  CT 08/28/2014.  FINDINGS: The heart size and mediastinal contours are stable with aortic atherosclerosis. 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 There has been interval partial clearing of the left lower lobe airspace  disease demonstrated on the most recent examination. There are improving bilateral pleural effusions. Underlying emphysema and bibasilar atelectasis are present. The bones appear unchanged. Probable bilateral cervical ribs.  IMPRESSION: 1. Interval partial clearing of left lower lobe airspace disease with improving bilateral pleural effusions. 2. Stable upper right paraspinal soft tissue mass.   Electronically Signed   By: Richardean Sale M.D.   On: 10/09/2014 12:17   US Renal  10/09/2014   CLINICAL DATA:  Acute on chronic renal failure.  Initial encounter.  EXAM: RENAL / URINARY TRACT ULTRASOUND COMPLETE  COMPARISON:  PET-CT 09/19/2014.  FINDINGS: Right Kidney:  Length: 9.2 cm. Echogenicity within normal limits. No mass or hydronephrosis visualized.  Left Kidney:  Length: 6.9 cm. There is cortical thinning and increased echogenicity. No hydronephrosis or focal cortical lesion identified.  Bladder:  Appears normal for degree of bladder distention.  IMPRESSION: 1. The left kidney is atrophied with cortical thinning, likely chronic. 2. No evidence of hydronephrosis.   Electronically Signed   By: Richardean Sale M.D.   On: 10/09/2014 18:42      Assessment & Plan: Pt is a 74 y.o. yo female with a PMHX of hypertension, breast cancer, diabetes, osteoporosis, adenocarcinoma of the lung, COPD, chronic kidney disease, was admitted on 10/09/2014 with acute renal failure.   1. Acute renal failure. On chronic kidney disease stage IV with GFR of 22. Chronic kidney disease seems to be secondary to atherosclerosis as there is cortical thinning noted on ultrasound. Left kidney is small/atrophied at 6.9 cm. Serum creatinine seems to be improving some with IV fluids. Agree with holding diuretics. We will see how she responds. 2. Proteinuria. Urinalysis shows proteinuria as well as glucosuria. 3. Diabetes type 2 with chronic kidney disease. Most recent A1c 7.3% on July 17.

## 2014-10-11 LAB — CBC WITH DIFFERENTIAL/PLATELET
Basophils Absolute: 0 10*3/uL (ref 0–0.1)
Basophils Relative: 1 %
Eosinophils Absolute: 0.3 10*3/uL (ref 0–0.7)
Eosinophils Relative: 4 %
HCT: 25.5 % — ABNORMAL LOW (ref 35.0–47.0)
Hemoglobin: 8.2 g/dL — ABNORMAL LOW (ref 12.0–16.0)
LYMPHS ABS: 1.1 10*3/uL (ref 1.0–3.6)
LYMPHS PCT: 13 %
MCH: 26.8 pg (ref 26.0–34.0)
MCHC: 32.1 g/dL (ref 32.0–36.0)
MCV: 83.5 fL (ref 80.0–100.0)
Monocytes Absolute: 0.7 10*3/uL (ref 0.2–0.9)
Monocytes Relative: 8 %
NEUTROS PCT: 74 %
Neutro Abs: 6.1 10*3/uL (ref 1.4–6.5)
PLATELETS: 155 10*3/uL (ref 150–440)
RBC: 3.06 MIL/uL — AB (ref 3.80–5.20)
RDW: 15.2 % — ABNORMAL HIGH (ref 11.5–14.5)
WBC: 8.2 10*3/uL (ref 3.6–11.0)

## 2014-10-11 LAB — GLUCOSE, CAPILLARY
GLUCOSE-CAPILLARY: 117 mg/dL — AB (ref 65–99)
GLUCOSE-CAPILLARY: 271 mg/dL — AB (ref 65–99)
Glucose-Capillary: 115 mg/dL — ABNORMAL HIGH (ref 65–99)

## 2014-10-11 LAB — RENAL FUNCTION PANEL
ALBUMIN: 2.2 g/dL — AB (ref 3.5–5.0)
ANION GAP: 9 (ref 5–15)
BUN: 101 mg/dL — ABNORMAL HIGH (ref 6–20)
CHLORIDE: 101 mmol/L (ref 101–111)
CO2: 30 mmol/L (ref 22–32)
Calcium: 8.2 mg/dL — ABNORMAL LOW (ref 8.9–10.3)
Creatinine, Ser: 2.91 mg/dL — ABNORMAL HIGH (ref 0.44–1.00)
GFR, EST AFRICAN AMERICAN: 17 mL/min — AB (ref >60)
GFR, EST NON AFRICAN AMERICAN: 15 mL/min — AB (ref >60)
Glucose, Bld: 119 mg/dL — ABNORMAL HIGH (ref 65–99)
POTASSIUM: 3.7 mmol/L (ref 3.5–5.1)
Phosphorus: 4.3 mg/dL (ref 2.5–4.6)
Sodium: 140 mmol/L (ref 135–145)

## 2014-10-11 MED ORDER — INSULIN GLARGINE 100 UNIT/ML ~~LOC~~ SOLN: 20.0000 [IU] | Freq: Every day | SUBCUTANEOUS | Status: AC

## 2014-10-11 MED ORDER — INSULIN ASPART 100 UNIT/ML ~~LOC~~ SOLN: 4.0000 [IU] | Freq: Three times a day (TID) | SUBCUTANEOUS | Status: AC

## 2014-10-11 MED ORDER — HYDROCOD POLST-CPM POLST ER 10-8 MG/5ML PO SUER: 5.0000 mL | Freq: Two times a day (BID) | ORAL | Status: AC | PRN

## 2014-10-11 NOTE — Progress Notes (Signed)
  Inpatient Diabetes Program Recommendations  AACE/ADA: New Consensus Statement on Inpatient Glycemic Control (2013)  Target Ranges:  Prepandial:   less than 140 mg/dL      [facility] postprandial:   less than 180 mg/dL (1-2 hours)      Critically ill patients:  140 - 180 mg/dL    Diabetes history: Type 2 diabetes Outpatient Diabetes medications: NPH and regular insulin (doses not specified on medication reconciliation) Current orders for Inpatient glycemic control:  Lantus 18 units daily, Novolog sensitive tid with meals.  When patient was at [facility] on 09/16/14 , patient was on Lantus to 24 units q HS, and Novolog 4 units tid with meals (hold if patient eats less than 50%).  With patient's current renal status, consider discontinuing current orders for correction insulin and return to basal insulin qhs and a small dose of Novolog at each meal.  Gentry Fitz, RN, BA, MHA, CDE Diabetes Coordinator Inpatient Diabetes Program  [phone] (Team Pager) [phone] ([place]) 10/11/2014 1:49 PM

## 2014-10-11 NOTE — Evaluation (Signed)
Physical Therapy Evaluation Patient Details Name: Whitney Wright MRN: [id number] DOB: 1940-05-21 Today's Date: 10/11/2014   History of Present Illness  Pt admitted for acute on chronic renal failure. From SNF on chronic O2, 2L. Pt with history of HTN, arthritis, DM, COPD, and CKD.  Clinical Impression  Pt is a pleasant 74 year old female who was admitted for chronic renal failure. Pt performs transfers and ambulation with min assist and cues for correct technique. Pt demonstrates deficits with endurance/mobility/strength. Pt would benefit from skilled PT to address above deficits and promote optimal return to PLOF. All mobility performed on 2L of O2 with sats decreasing to 86% with exertion. Recommend transition to STR upon discharge from acute hospitalization.        Follow Up Recommendations SNF    Equipment Recommendations  Rolling walker with 5" wheels;3in1 (PT)    Recommendations for Other Services       Precautions / Restrictions Precautions Precautions: Fall Restrictions Weight Bearing Restrictions: No      Mobility  Bed Mobility               General bed mobility comments: not assessed at pt currently in recliner  Transfers Overall transfer level: Needs assistance Equipment used: Rolling walker (2 wheeled)             General transfer comment: sit<>stand with rw and min assist with cues for correct technique. Pt performed all mobility on 2L of O2. Pt slightly impulsive, needs cues to wait for therapist prior to standing.  Ambulation/Gait Ambulation/Gait assistance: Min assist Ambulation Distance (Feet): 100 Feet Assistive device: Rolling walker (2 wheeled)       General Gait Details: ambulated using rw and min assist. Pt with dyspnea on exertion with sats decreasing to 86% with limited ambulation distance. Reciprocal gait pattern performed, however slow gait speed noted.  Stairs            Wheelchair Mobility    Modified Rankin  (Stroke Patients Only)       Balance Overall balance assessment: Needs assistance Sitting-balance support: No upper extremity supported Sitting balance-Leahy Scale: Good     Standing balance support: Bilateral upper extremity supported Standing balance-Leahy Scale: Good                               Pertinent Vitals/Pain Pain Assessment: No/denies pain    Home Living Family/patient expects to be discharged to:: Skilled nursing facility Living Arrangements: Alone   Type of Home: Apartment Home Access: Stairs to enter;Ramped entrance Entrance Stairs-Rails: Can reach both Entrance Stairs-Number of Steps: 4 Home Layout: One level Home Equipment: None      Prior Function Level of Independence: Independent         Comments: Had been ambulating without a device prior to these admissions.      Hand Dominance        Extremity/Trunk Assessment   Upper Extremity Assessment: Overall WFL for tasks assessed           Lower Extremity Assessment: RLE deficits/detail RLE Deficits / Details: grossly 3+/5       Communication   Communication: No difficulties  Cognition Arousal/Alertness: Awake/alert Behavior During Therapy: WFL for tasks assessed/performed Overall Cognitive Status: Within Functional Limits for tasks assessed                      General Comments      Exercises Other  Exercises Other Exercises: Pt performed seated ther-ex inluding B LE ankle pumps, SAQ, and hip add squeezes. All ther-ex performed x 10 reps with cues for correct technique and breaks as needed for respiratory maintence.      Assessment/Plan    PT Assessment Patient needs continued PT services  PT Diagnosis Difficulty walking;Generalized weakness   PT Problem List Decreased strength;Decreased activity tolerance;Decreased mobility;Cardiopulmonary status limiting activity;Decreased balance  PT Treatment Interventions DME instruction;Gait training;Stair  training;Functional mobility training;Therapeutic activities;Therapeutic exercise;Balance training;Patient/family education;Neuromuscular re-education   PT Goals (Current goals can be found in the Care Plan section) Acute Rehab PT Goals Patient Stated Goal: To increase her ambulation safety, tolerance, and independence.  PT Goal Formulation: With patient Time For Goal Achievement: 10/25/14 Potential to Achieve Goals: Good    Frequency Min 2X/week   Barriers to discharge        Co-evaluation               End of Session Equipment Utilized During Treatment: Gait belt;Oxygen Activity Tolerance: Patient tolerated treatment well;Patient limited by fatigue Patient left: in chair (no alarm present; RN aware) Nurse Communication: Mobility status         Time: 7741-2878 PT Time Calculation (min) (ACUTE ONLY): 19 min   Charges:   PT Evaluation $Initial PT Evaluation Tier I: 1 Procedure PT Treatments $Therapeutic Exercise: 8-22 mins   PT G Codes:        Lema Heinkel November 07, 2014, 2:37 PM  Greggory Stallion, PT, DPT [phone]

## 2014-10-11 NOTE — Progress Notes (Signed)
Subjective:  Overall she feels better.  Breathing is better.  Serum creatinine improved slightly to 2.91   Objective:  Vital signs in last 24 hours:  Temp:  [98.2 F (36.8 C)-98.9 F (37.2 C)] 98.7 F (37.1 C) (08/12 1130) Pulse Rate:  [87-91] 88 (08/12 1130) Resp:  [18-20] 20 (08/12 1130) BP: (123-145)/(40-97) 144/40 mmHg (08/12 1130) SpO2:  [96 %-100 %] 100 % (08/12 1130) FiO2 (%):  [4 %] 4 % (08/12 0505)  Weight change:  Filed Weights   10/09/14 2033  Weight: 64.638 kg (142 lb 8 oz)    Intake/Output:    Intake/Output Summary (Last 24 hours) at 10/11/14 1230 Last data filed at 10/11/14 0914  Gross per 24 hour  Intake    600 ml  Output    950 ml  Net   -350 ml     Physical Exam: General: No acute distress, laying in the bed  HEENT Anicteric, moist mucous membranes  Neck supple  Pulm/lungs Bilateral rhonchi, mild basilar crackles  CVS/Heart Irregular rhythm, no rub or gallop  Abdomen:  Soft, nontender  Extremities: No peripheral edema  Neurologic: Alert,nonfocal  Skin: No acute rashes  Access:        Basic Metabolic Panel:   Recent Labs Lab 10/09/14 1116 10/10/14 0545 10/11/14 0450  NA 132* 140 140  K 4.5 3.7 3.7  CL 86* 94* 101  CO2 32 34* 30  GLUCOSE 276* 109* 119*  BUN 130* 104* 101*  CREATININE 4.11* 3.54* 2.91*  CALCIUM 8.7* 8.6* 8.2*  PHOS  --   --  4.3     CBC:  Recent Labs Lab 10/09/14 1223 10/10/14 0545 10/11/14 0500  WBC 17.7* 12.8* 8.2  NEUTROABS 16.6*  --  6.1  HGB 9.0* 8.3* 8.2*  HCT 27.9* 25.6* 25.5*  MCV 83.0 83.5 83.5  PLT 180 162 155      Microbiology:  Recent Results (from the past 720 hour(s))  Culture, blood (routine x 2)     Status: None   Collection Time: 09/15/14  5:44 PM  Result Value Ref Range Status   Specimen Description BLOOD RIGHT ASSIST CONTROL  Final   Special Requests   Final    BOTTLES DRAWN AEROBIC AND ANAEROBIC  AER 6CC ANA 8CC   Culture NO GROWTH 5 DAYS  Final   Report Status  09/20/2014 FINAL  Final  Culture, blood (routine x 2)     Status: None   Collection Time: 09/15/14  5:47 PM  Result Value Ref Range Status   Specimen Description BLOOD RIGHT FATTY CASTS  Final   Special Requests   Final    BOTTLES DRAWN AEROBIC AND ANAEROBIC  AER 5CC ANA [place]   Culture NO GROWTH 5 DAYS  Final   Report Status 09/20/2014 FINAL  Final  Culture, expectorated sputum-assessment     Status: None   Collection Time: 09/18/14  1:11 PM  Result Value Ref Range Status   Specimen Description EXPECTORATED SPUTUM  Final   Special Requests NONE  Final   Sputum evaluation   Final    Sputum specimen not acceptable for testing.  Please recollect.     Report Status 09/18/2014 FINAL  Final  MRSA PCR Screening     Status: None   Collection Time: 10/09/14 11:43 PM  Result Value Ref Range Status   MRSA by PCR NEGATIVE NEGATIVE Final    Comment:        The GeneXpert MRSA Assay (FDA approved for NASAL specimens only),  is one component of a comprehensive MRSA colonization surveillance program. It is not intended to diagnose MRSA infection nor to guide or monitor treatment for MRSA infections.     Coagulation Studies: No results for input(s): LABPROT, INR in the last 72 hours.  Urinalysis:  Recent Labs  10/09/14 1823  COLORURINE STRAW*  LABSPEC 1.011  PHURINE 7.0  GLUCOSEU 150*  HGBUR NEGATIVE  BILIRUBINUR NEGATIVE  KETONESUR NEGATIVE  PROTEINUR 100*  NITRITE NEGATIVE  LEUKOCYTESUR NEGATIVE      Imaging: US Renal  10/09/2014   CLINICAL DATA:  Acute on chronic renal failure.  Initial encounter.  EXAM: RENAL / URINARY TRACT ULTRASOUND COMPLETE  COMPARISON:  PET-CT 09/19/2014.  FINDINGS: Right Kidney:  Length: 9.2 cm. Echogenicity within normal limits. No mass or hydronephrosis visualized.  Left Kidney:  Length: 6.9 cm. There is cortical thinning and increased echogenicity. No hydronephrosis or focal cortical lesion identified.  Bladder:  Appears normal for degree of bladder  distention.  IMPRESSION: 1. The left kidney is atrophied with cortical thinning, likely chronic. 2. No evidence of hydronephrosis.   Electronically Signed   By: Richardean Sale M.D.   On: 10/09/2014 18:42     Medications:     . aspirin EC  81 mg Oral Daily  . chlorpheniramine-HYDROcodone  5 mL Oral BID  . GLUCERNA  1 Can Oral BID BM  . guaiFENesin  600 mg Oral BID  . heparin  5,000 Units Subcutaneous 3 times per day  . hydrALAZINE  50 mg Oral BID  . insulin aspart  0-15 Units Subcutaneous TID AC & HS  . levothyroxine  100 mcg Oral QAC breakfast  . mometasone-formoterol  2 puff Inhalation BID  . pravastatin  10 mg Oral q1800  . sennosides-docusate sodium  2 tablet Oral BID  . tamoxifen  20 mg Oral Daily  . tiotropium  18 mcg Inhalation Daily   albuterol, ALPRAZolam, bisacodyl, calcium carbonate, ipratropium-albuterol, morphine CONCENTRATE, ondansetron, oxyCODONE-acetaminophen  Assessment/ Plan:  74 y.o. female with a PMHX of hypertension, breast cancer, diabetes, osteoporosis, adenocarcinoma of the lung, COPD, chronic kidney disease, was admitted on 10/09/2014 with acute renal failure.   1. Acute renal failure. On chronic kidney disease stage IV with GFR of 22. Chronic kidney disease seems to be secondary to atherosclerosis as there is cortical thinning noted on ultrasound. Left kidney is small/atrophied at 6.9 cm.  . Agree with holding diuretics.   Will followup with her in the office next week 2. Proteinuria. Urinalysis shows proteinuria as well as glucosuria. 3. Diabetes type 2 with chronic kidney disease. Most recent A1c 7.3% on July 17.    LOS: 2 Whitney Wright 8/12/201612:30 PM

## 2014-10-11 NOTE — Plan of Care (Signed)
Problem: Discharge Progression Outcomes Goal: Discharge plan in place and appropriate Individualization of care  Outcome: Progressing Individualization of Care Hx of HTN, cellulitis, thyroid disease, arthritis, DM, breast and lung cancer, COPD, CKD, osteoporosis. Admitted for Acute/Chronic Renal Failure   Patient admitted for Acute/chronic Renal Failure. 1. Patient uses chronic O2 per [place] -running at 4L this shift.  Patient tolerating well. 2. Pain: Patient has had no complaints of pain this shift. 3. Hemodynamics: Patient on 4L of O2. Continuous fluids stopped this shift. BP 145/59 mmHg  Pulse 91  Temp(Src) 98.3 F (36.8 C) (Oral)  Resp 18  Ht 5' (1.524 m)  Wt 142 lb 8 oz (64.638 kg)  BMI 27.83 kg/m2  SpO2 96% 4. Diet: Patient on heart healthy/carb modified diet.  5. Activity: Patient up with minimal assist to [facility]. She uses a walker at home. Slight weakness and unsteady gait. Fall precautions observed.

## 2014-10-11 NOTE — Clinical Social Work Note (Signed)
Pt is ready for discharge today to [facility]. [employer] Josem Kaufmann has been obtained. Facility has received discharge information and is read to admit pt. Pt and daughter are agreeable to discharge plan. RN to call report and EMS will provide transportation. CSW is signing off as no further needs identified.   Darden Dates, MSW, LCSW Clinical Social Worker  [phone]

## 2014-10-11 NOTE — Progress Notes (Signed)
PROGRESS NOTE  Whitney Wright ZOX:[id number] DOB: 1940-10-11 DOA: 10/09/2014 PCP: Tracie Harrier, MD  Subjective:  74 y/o f with hx of Ca Lung  HTN, OA,Type 2 DM, CKD recently discharged to SNF returns to ED with fatigue, tiredness and dry mouth. Pt was noted to be in Acute Renal failure with Se creat of 4.11 (Baseline Creat; 1.71). This am feels better Se Creat improved to 2.91   Objective: BP 138/62 mmHg  Pulse 88  Temp(Src) 98.9 F (37.2 C) (Oral)  Resp 20  Ht 5' (1.524 m)  Wt 64.638 kg (142 lb 8 oz)  BMI 27.83 kg/m2  SpO2 97%  Intake/Output Summary (Last 24 hours) at 10/11/14 0816 Last data filed at 10/11/14 0302  Gross per 24 hour  Intake 1513.75 ml  Output    700 ml  Net 813.75 ml   Filed Weights   10/09/14 2033  Weight: 64.638 kg (142 lb 8 oz)    Exam:  HEENT: , AT.  General:  In mild distress  Cardiovascular: S1 S2   Respiratory: Clear to auscultation  Abdomen: Soft. Non Tender  Neuro:Non Focal   Data Reviewed: Basic Metabolic Panel:  Recent Labs Lab 10/09/14 1116 10/10/14 0545 10/11/14 0450  NA 132* 140 140  K 4.5 3.7 3.7  CL 86* 94* 101  CO2 32 34* 30  GLUCOSE 276* 109* 119*  BUN 130* 104* 101*  CREATININE 4.11* 3.54* 2.91*  CALCIUM 8.7* 8.6* 8.2*  PHOS  --   --  4.3   Liver Function Tests:  Recent Labs Lab 10/11/14 0450  ALBUMIN 2.2*   No results for input(s): LIPASE, AMYLASE in the last 168 hours. No results for input(s): AMMONIA in the last 168 hours. CBC:  Recent Labs Lab 10/09/14 1223 10/10/14 0545 10/11/14 0500  WBC 17.7* 12.8* 8.2  NEUTROABS 16.6*  --  6.1  HGB 9.0* 8.3* 8.2*  HCT 27.9* 25.6* 25.5*  MCV 83.0 83.5 83.5  PLT 180 162 155   Cardiac Enzymes:    Recent Labs Lab 10/09/14 1116  TROPONINI 0.03   BNP (last 3 results)  Recent Labs  08/17/14 1006 09/15/14 1744 10/09/14 1223  BNP 390.0* 558.0* 359.0*    ProBNP (last 3 results) No results for input(s): PROBNP in the last 8760  hours.  CBG:  Recent Labs Lab 10/10/14 0749 10/10/14 1135 10/10/14 1618 10/10/14 2156 10/11/14 0748  GLUCAP 112* 243* 153* 108* 117*    Recent Results (from the past 240 hour(s))  MRSA PCR Screening     Status: None   Collection Time: 10/09/14 11:43 PM  Result Value Ref Range Status   MRSA by PCR NEGATIVE NEGATIVE Final    Comment:        The GeneXpert MRSA Assay (FDA approved for NASAL specimens only), is one component of a comprehensive MRSA colonization surveillance program. It is not intended to diagnose MRSA infection nor to guide or monitor treatment for MRSA infections.      Studies: No results found.  Scheduled Meds: . aspirin EC  81 mg Oral Daily  . chlorpheniramine-HYDROcodone  5 mL Oral BID  . GLUCERNA  1 Can Oral BID BM  . guaiFENesin  600 mg Oral BID  . heparin  5,000 Units Subcutaneous 3 times per day  . hydrALAZINE  50 mg Oral BID  . insulin aspart  0-15 Units Subcutaneous TID AC & HS  . levothyroxine  100 mcg Oral QAC breakfast  . mometasone-formoterol  2 puff Inhalation BID  .  pravastatin  10 mg Oral q1800  . sennosides-docusate sodium  2 tablet Oral BID  . tamoxifen  20 mg Oral Daily  . tiotropium  18 mcg Inhalation Daily    Continuous Infusions:    Assessment/Plan: 1  Acute on chronic renal failure secondary to over diuresis from Torsem[place]. Renal function continues to improve with IV hydration Se Creat: 2.91 2 COPD; Continue SVN's and Spiriva  3 Ca lung: Oncology following - Morphine for pain 4 Type 2 DM ; Continue Novolog 5 HTN: Continue Hydralazine  For SNF soon   Code Status: DNR       Whitney Wright   10/11/2014, 8:16 AM  LOS: 2 days

## 2014-10-11 NOTE — Discharge Summary (Signed)
Physician Discharge Summary  Whitney Wright LZJ:[id number] DOB: February 26, 1941 DOA: 10/09/2014  PCP: Tracie Harrier, MD  Admit date: 10/09/2014 Discharge date: 10/11/2014  Time spent: 35 minutes  Recommendations for Outpatient Follow-up:  1 D/c to Skilled Nursing  Call office to make appt in 1-2 weeks    Discharge Diagnoses:   1  Acute on chronic renal failure secondary to diuretics  2 COPD 3 Type 2 DM 4  Ca Lung   Discharge Condition: Stable  Diet recommendation: 1800 cals   Filed Weights   10/09/14 2033  Weight: 64.638 kg (142 lb 8 oz)    History of present illness:  Kaylenn Civil is a 74 year old female with a known history of Hypertension ,Osteo arthritis,Diabetes, CA lung<COPD, Chronic kidney disease was recently discharged to skilled nursing facility presenting the ED complaining off on not feeling well patient also reports dry mouth and a sense of weakness. Patient was noted have an elevated serum creatinine 4.11 admission baseline serum creatinine being 1.71    Hospital Course:  Patient was admitted [facility] and received intravenous fluids Serum creatinine gradually improved she was seen in consultation by Dr. Candiss Norse nephrologist and also Dr. Ma Hillock oncologist   her diabetics were held  She was also evaluated by physical therapy and advised skilled nursing She was continued on Lantus insulin 20 units subcutaneous daily at bedtime and the NovoLog insulin 4 units 3 times a day with meals. She has been advised keep follow appointment with oncologist Dr. Jetty Peeks also Dr. Murlean Iba and with me Dr. Ginette Pitman  in 1-2 weeks' time    Consultations:  Dr. Candiss Norse- Nephrologist  Dr.Pandit- Oncologist  Discharge Exam: Filed Vitals:   10/11/14 1130  BP: 144/40  Pulse: 88  Temp: 98.7 F (37.1 C)  Resp: 20    General: Not in distress Cardiovascular: s1 s2 Respiratory: Scattered expiratory wheezes  Discharge Instructions    Current  Discharge Medication List    START taking these medications   Details  insulin aspart (NOVOLOG) 100 UNIT/ML injection Inject 4 Units into the skin 3 (three) times daily before meals. Qty: 10 mL, Refills: 11    insulin glargine (LANTUS) 100 UNIT/ML injection Inject 0.2 mLs (20 Units total) into the skin at bedtime. Qty: 10 mL, Refills: 11      CONTINUE these medications which have CHANGED   Details  chlorpheniramine-HYDROcodone (TUSSIONEX PENNKINETIC ER) 10-8 MG/5ML SUER Take 5 mLs by mouth every 12 (twelve) hours as needed for cough. Qty: 140 mL, Refills: 0      CONTINUE these medications which have NOT CHANGED   Details  acetaminophen (TYLENOL) 325 MG tablet Take 650 mg by mouth every 4 (four) hours as needed for mild pain, moderate pain or fever.    ADVAIR DISKUS 250-50 MCG/DOSE AEPB Inhale 1 puff into the lungs 2 (two) times daily.  Refills: 5    albuterol (PROVENTIL HFA;VENTOLIN HFA) 108 (90 BASE) MCG/ACT inhaler Inhale 2 puffs into the lungs every 6 (six) hours as needed for wheezing or shortness of breath.     ALPRAZolam (XANAX) 0.25 MG tablet Take 0.25 mg by mouth at bedtime as needed for anxiety.  Refills: 3    Alum & Mag Hydroxide-Simeth (MAG-AL PLUS XS PO) Take 30 mLs by mouth every 4 (four) hours as needed (for gas/ingestion.).    Amino Acids-Protein Hydrolys (FEEDING SUPPLEMENT, PRO-STAT SUGAR FREE 64,) LIQD Take 30 mLs by mouth 3 (three) times daily with meals.    aspirin EC 81 MG tablet  Take 81 mg by mouth daily.    bisacodyl (DULCOLAX) 10 MG suppository Place 10 mg rectally daily as needed for moderate constipation.    calcium carbonate (OS-CAL) 600 MG TABS tablet Take 600 mg by mouth 2 (two) times daily with a meal.    Cholecalciferol 1000 UNITS TBDP Take 1,000 Units by mouth daily.     GLUCERNA (GLUCERNA) LIQD Take 1 Can by mouth 2 (two) times daily between meals.    guaiFENesin (MUCINEX) 600 MG 12 hr tablet Take 600 mg by mouth 2 (two) times daily.     hydrocortisone cream 1 % Apply 1 application topically 4 (four) times daily as needed (for dry hemorrhoids.).    ipratropium-albuterol (DUONEB) 0.5-2.5 (3) MG/3ML SOLN Inhale 3 mLs into the lungs See admin instructions. Use 1 vial (66m) via nebulizer every 8 hours, and 1 vial (334m) via nebulizer every 4 hours as needed for wheezing /shortness of breath. Refills: 5    !! levothyroxine (SYNTHROID, LEVOTHROID) 100 MCG tablet Take 100 mcg by mouth daily before breakfast. Take along witg 88 mcg tablet for total dose of 188 mcg.    !! levothyroxine (SYNTHROID, LEVOTHROID) 88 MCG tablet Take 88 mcg by mouth daily before breakfast. Take along with 100 mcg tablet for a total dose of 188 mcg.    lovastatin (MEVACOR) 20 MG tablet Take 1 tablet by mouth daily.    magnesium hydroxide (MILK OF MAGNESIA) 400 MG/5ML suspension Take 30 mLs by mouth daily as needed for mild constipation or moderate constipation.    morphine (ROXANOL) 20 MG/ML concentrated solution Take 5-10 mg by mouth See admin instructions. Take '5mg'$  (0.2581mevery 4 hours. May take 5-'10mg'$  (0.61m5m5ml) every hour as needed for pain or dyspnea    oxyCODONE-acetaminophen (PERCOCET/ROXICET) 5-325 MG per tablet Take 1 tablet by mouth every 6 (six) hours as needed (chest pain). Qty: 30 tablet, Refills: 0    sennosides-docusate sodium (SENOKOT-S) 8.6-50 MG tablet Take 2 tablets by mouth 2 (two) times daily.    tamoxifen (NOLVADEX) 10 MG tablet Take 20 mg by mouth daily.    tiotropium (SPIRIVA) 18 MCG inhalation capsule Place 18 mcg into inhaler and inhale daily.    hydrALAZINE (APRESOLINE) 50 MG tablet Take 1 tablet (50 mg total) by mouth 2 (two) times daily. Qty: 60 tablet, Refills: 3     !! - Potential duplicate medications found. Please discuss with provider.    STOP taking these medications     calcium carbonate (TUMS - DOSED IN MG ELEMENTAL CALCIUM) 500 MG chewable tablet      insulin NPH Human (HUMULIN N,NOVOLIN N) 100 UNIT/ML  injection      metolazone (ZAROXOLYN) 5 MG tablet      ondansetron (ZOFRAN) 4 MG tablet      potassium chloride (MICRO-K) 10 MEQ CR capsule      predniSONE (DELTASONE) 20 MG tablet      torsemide (DEMADEX) 20 MG tablet      levofloxacin (LEVAQUIN) 250 MG tablet        Allergies  Allergen Reactions  . Hydrocodone-Acetaminophen Rash  . Macrodantin [Nitrofurantoin] Rash   Follow-up Information    [facility]   Specialty:  Internal Medicine   Contact information:   [phone]    The results of significant diagnostics from this hospitalization (including imaging, microbiology, ancillary and laboratory) are listed below for reference.    Significant Diagnostic Studies:  Dg Chest 2 View  10/09/2014   CLINICAL DATA:  Shortness of breath with difficulty breathing. History of COPD and breast cancer Initial encounter.  EXAM: CHEST  2 VIEW  COMPARISON:  Radiographs 09/16/2014 and 09/15/2014.  CT 08/28/2014.  FINDINGS: The heart size and mediastinal contours are stable with aortic atherosclerosis. 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 There has been interval partial clearing of the left lower lobe airspace disease demonstrated on the most recent examination. There are improving bilateral pleural effusions. Underlying emphysema and bibasilar atelectasis are present. The bones appear unchanged. Probable bilateral cervical ribs.  IMPRESSION: 1. Interval partial clearing of left lower lobe airspace disease with improving bilateral pleural effusions. 2. Stable upper right paraspinal soft tissue mass.   Electronically Signed   By: Richardean Sale M.D.   On: 10/09/2014 12:17   X-ray Chest Pa And Lateral  09/16/2014   CLINICAL DATA:  New admission for pneumonia. Chronic obstructive lung disease. History of left upper lobe lung cancer.  EXAM:  CHEST  2 VIEW  COMPARISON:  09/15/2014 and previous  FINDINGS: Background pattern of chronic emphysema. There is more pleural density at the left base than was seen in June. There is patchy radiographic density at the left base consistent with chronic volume loss and pneumonia. No focal finding evident on the right other than emphysema. 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  IMPRESSION: Similar appearance to the study of yesterday. Left effusion and left lower lobe infiltrate since with chronic and acute pneumonia.  Emphysema.  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   Electronically Signed   By: Nelson Chimes M.D.   On: 09/16/2014 07:35   US Renal  10/09/2014   CLINICAL DATA:  Acute on chronic renal failure.  Initial encounter.  EXAM: RENAL / URINARY TRACT ULTRASOUND COMPLETE  COMPARISON:  PET-CT 09/19/2014.  FINDINGS: Right Kidney:  Length: 9.2 cm. Echogenicity within normal limits. No mass or hydronephrosis visualized.  Left Kidney:  Length: 6.9 cm. There is cortical thinning and increased echogenicity. No hydronephrosis or focal cortical lesion identified.  Bladder:  Appears normal for degree of bladder distention.  IMPRESSION: 1. The left kidney is atrophied with cortical thinning, likely chronic. 2. No evidence of hydronephrosis.   Electronically Signed   By: Richardean Sale M.D.   On: 10/09/2014 18:42   Nm Pet Image Initial (pi) Skull Base To Thigh  09/19/2014   CLINICAL DATA:  Subsequent Treatment strategy for Lung cancer.  EXAM: NUCLEAR MEDICINE PET SKULL BASE TO THIGH  TECHNIQUE: 11.57 mCi F-18 FDG was injected intravenously. Full-ring PET imaging was performed from the skull base to thigh after the radiotracer. CT data was obtained and used for attenuation correction and anatomic localization.  FASTING BLOOD GLUCOSE:  Value: 92 mg/dl  COMPARISON:  01/15/2014  FINDINGS: NECK  No hypermetabolic  lymph nodes in the neck.  CHEST  Right paratracheal lymph node measures 1.1 cm and has an SUV max equal to 3.26. Previously this lymph node measured 0.9 cm and had an SUV max equal to 2.7. Moderate volume left pleural effusion identified. Overlying compressive type atelectasis is noted. Left lower lobe lung nodule is identified posteriorly measuring 1.8 cm. This has an SUV max equal to 3.53. Stable appearance of subpleural nodule within the posterior medial right apex measuring 3.3 cm, image 55/series 3. No significant  FDG uptake is within this structure.  ABDOMEN/PELVIS  Small nonspecific focus of increased uptake within the lower rectum/ anus has an SUV max equal to 10.0. This is compared with 5.2 previously. There is no abnormal hyper metabolism within the liver, spleen, adrenal glands, or pancreas. The visualized portions of stress that aortic atherosclerosis noted. No hypermetabolic upper abdominal or pelvic lymph nodes identified.  SKELETON  No focal hypermetabolic activity to suggest skeletal metastasis.  IMPRESSION: 1. Mild increase in size and degree of FDG uptake associated with right paratracheal lymph node. Additionally, there is a new left pleural effusion and a persistent focus of hyper metabolism within the posterior left lung base. Residual tumor cannot be excluded. 2. Stable non hypermetabolic low density, subpleural mass within the posterior medial right apex. 3. Intense focus of increased uptake is identified within the lower rectum/anus area. Correlation with direct visualization would be advised.   Electronically Signed   By: Kerby Moors M.D.   On: 09/19/2014 13:23   Dg Chest Portable 1 View  09/15/2014   CLINICAL DATA:  Shortness of breath, productive cough  EXAM: PORTABLE CHEST - 1 VIEW  COMPARISON:  CT chest dated 08/28/2014  FINDINGS: Patchy lingular/left lower lobe opacity, some of which is likely chronic. Superimposed infection is not excluded.  Small left pleural effusion.  No  pneumothorax.  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  Cardiomegaly.  IMPRESSION: Patchy lingular/ left lower lobe opacity, some of which is likely chronic, superimposed infection not excluded.  Small left pleural effusion.  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   Electronically Signed   By: Julian Hy M.D.   On: 09/15/2014 17:53    Microbiology: Recent Results (from the past 240 hour(s))  MRSA PCR Screening     Status: None   Collection Time: 10/09/14 11:43 PM  Result Value Ref Range Status   MRSA by PCR NEGATIVE NEGATIVE Final    Comment:        The GeneXpert MRSA Assay (FDA approved for NASAL specimens only), is one component of a comprehensive MRSA colonization surveillance program. It is not intended to diagnose MRSA infection nor to guide or monitor treatment for MRSA infections.      Labs: Basic Metabolic Panel:  Recent Labs Lab 10/09/14 1116 10/10/14 0545 10/11/14 0450  NA 132* 140 140  K 4.5 3.7 3.7  CL 86* 94* 101  CO2 32 34* 30  GLUCOSE 276* 109* 119*  BUN 130* 104* 101*  CREATININE 4.11* 3.54* 2.91*  CALCIUM 8.7* 8.6* 8.2*  PHOS  --   --  4.3   Liver Function Tests:  Recent Labs Lab 10/11/14 0450  ALBUMIN 2.2*   No results for input(s): LIPASE, AMYLASE in the last 168 hours. No results for input(s): AMMONIA in the last 168 hours. CBC:  Recent Labs Lab 10/09/14 1223 10/10/14 0545 10/11/14 0500  WBC 17.7* 12.8* 8.2  NEUTROABS 16.6*  --  6.1  HGB 9.0* 8.3* 8.2*  HCT 27.9* 25.6* 25.5*  MCV 83.0 83.5 83.5  PLT 180 162 155   Cardiac Enzymes:  Recent Labs Lab 10/09/14 1116  TROPONINI 0.03   BNP: BNP (last 3 results)  Recent Labs  08/17/14 1006 09/15/14 1744 10/09/14 1223  BNP 390.0* 558.0* 359.0*    ProBNP (last 3 results) No results for input(s):  PROBNP in the  last 8760 hours.  CBG:  Recent Labs Lab 10/10/14 1135 10/10/14 1618 10/10/14 2156 10/11/14 0748 10/11/14 1146  GLUCAP 243* 153* 108* 117* 271*       Signed:  Odies Desa   10/11/2014, 1:58 PM

## 2014-10-11 NOTE — Care Management Important Message (Signed)
Important Message  Patient Details  Name: Whitney Wright MRN: [id number] Date of Birth: Feb 22, 1941   Medicare Important Message Given:  Yes-second notification given    [name] 10/11/2014, 10:48 AM

## 2014-10-14 LAB — PROTEIN ELECTROPHORESIS, SERUM
A/G Ratio: 0.8 (ref 0.7–1.7)
ALBUMIN ELP: 2.1 g/dL — AB (ref 2.9–4.4)
ALPHA-1-GLOBULIN: 0.3 g/dL (ref 0.0–0.4)
Alpha-2-Globulin: 1.1 g/dL — ABNORMAL HIGH (ref 0.4–1.0)
Beta Globulin: 0.7 g/dL (ref 0.7–1.3)
GAMMA GLOBULIN: 0.5 g/dL (ref 0.4–1.8)
GLOBULIN, TOTAL: 2.5 g/dL (ref 2.2–3.9)
TOTAL PROTEIN ELP: 4.6 g/dL — AB (ref 6.0–8.5)

## 2014-10-17 NOTE — Op Note (Signed)
[facility]astroenterology Patient Name: Whitney Wright Procedure Date: 09/20/2014 9:11 AM MRN: [id number] Account #: [id number] Date of Birth: [birth year] Admit Type: Inpatient Age: 74 Room: [facility] ENDO ROOM 1 Gender: Female Note Status: Finalized Procedure:         Flexible Sigmoidoscopy Indications:       Abnormal PET scan of the GI tract Providers:         Manya Silvas, MD Referring MD:      Tracie Harrier, MD (Referring MD) Medicines:         lidocaine ointment Complications:     No immediate complications. Procedure:         Pre-Anesthesia Assessment:                    - After reviewing the risks and benefits, the patient was                     deemed in satisfactory condition to undergo the procedure.                    After obtaining informed consent, the scope was passed                     under direct vision. The Olympus GIF-160 endoscope (S#.                     [phone]) was introduced through the anus and advanced to                     the the sigmoid colon. The flexible sigmoidoscopy was                     accomplished without difficulty. The patient tolerated the                     procedure well. The quality of the bowel preparation was                     excellent. Findings:      An upper scope was used since she has lung disease and the suspected       abnormality was in the distal rectum.      A small, non-bleeding irregular shape polyp was found in the distal       rectum. The polyp was sessile. Biopsies were taken with a cold forceps       for histology. Impression:        - One small, non-bleeding polyp in the rectum. Biopsied. Recommendation:    - Await pathology results.                    - The findings and recommendations were discussed with the                     patient. Manya Silvas, MD 10/16/2014 3:31:17 PM Number of Addenda: 0 Note Initiated On: 09/20/2014 9:11 AM      [facility]

## 2014-10-29 ENCOUNTER — Inpatient Hospital Stay: Payer: Commercial Managed Care - HMO | Admitting: Internal Medicine

## 2014-11-08 ENCOUNTER — Inpatient Hospital Stay: Payer: Commercial Managed Care - HMO | Attending: Internal Medicine | Admitting: Internal Medicine

## 2014-11-08 VITALS — BP 172/72 | HR 94 | Temp 99.3°F | Resp 18 | Ht 60.0 in | Wt 143.1 lb

## 2014-11-08 DIAGNOSIS — Z79899 Other long term (current) drug therapy: Secondary | ICD-10-CM | POA: Diagnosis not present

## 2014-11-08 DIAGNOSIS — Z853 Personal history of malignant neoplasm of breast: Secondary | ICD-10-CM

## 2014-11-08 DIAGNOSIS — C3432 Malignant neoplasm of lower lobe, left bronchus or lung: Secondary | ICD-10-CM

## 2014-11-08 DIAGNOSIS — E119 Type 2 diabetes mellitus without complications: Secondary | ICD-10-CM | POA: Diagnosis not present

## 2014-11-08 DIAGNOSIS — R531 Weakness: Secondary | ICD-10-CM | POA: Insufficient documentation

## 2014-11-08 DIAGNOSIS — M810 Age-related osteoporosis without current pathological fracture: Secondary | ICD-10-CM

## 2014-11-08 DIAGNOSIS — R0602 Shortness of breath: Secondary | ICD-10-CM | POA: Diagnosis not present

## 2014-11-08 DIAGNOSIS — I10 Essential (primary) hypertension: Secondary | ICD-10-CM | POA: Diagnosis not present

## 2014-11-08 DIAGNOSIS — Z803 Family history of malignant neoplasm of breast: Secondary | ICD-10-CM

## 2014-11-08 DIAGNOSIS — N189 Chronic kidney disease, unspecified: Secondary | ICD-10-CM | POA: Insufficient documentation

## 2014-11-08 DIAGNOSIS — I129 Hypertensive chronic kidney disease with stage 1 through stage 4 chronic kidney disease, or unspecified chronic kidney disease: Secondary | ICD-10-CM | POA: Diagnosis not present

## 2014-11-08 DIAGNOSIS — C3492 Malignant neoplasm of unspecified part of left bronchus or lung: Secondary | ICD-10-CM

## 2014-11-08 DIAGNOSIS — Z7982 Long term (current) use of aspirin: Secondary | ICD-10-CM | POA: Diagnosis not present

## 2014-11-08 DIAGNOSIS — Z87891 Personal history of nicotine dependence: Secondary | ICD-10-CM

## 2014-11-08 DIAGNOSIS — M129 Arthropathy, unspecified: Secondary | ICD-10-CM

## 2014-11-08 DIAGNOSIS — E039 Hypothyroidism, unspecified: Secondary | ICD-10-CM

## 2014-11-08 DIAGNOSIS — J449 Chronic obstructive pulmonary disease, unspecified: Secondary | ICD-10-CM | POA: Insufficient documentation

## 2014-11-08 NOTE — Progress Notes (Signed)
Patient is here for follow-up. She states that she has been having bouts with nausea. She has had some vomiting. She states that they give her nausea medication on a regular basis at [facility]. She states that her breathing has been good. Her appetite has been up and down.

## 2014-11-26 NOTE — Progress Notes (Signed)
[facility]  Telephone:([phone]) [phone] [phone]) [phone]     ID: Whitney Wright OB: Mar 26, 1940  MR#: [id number]  EKC#:[id number]  Patient Care Team: Tracie Harrier, MD as PCP - General (Internal Medicine) Robert Bellow, MD (General Surgery) Tracie Harrier, MD as Physician Assistant (Internal Medicine)  CHIEF COMPLAINT/DIAGNOSIS:  1. History of Breast Ca Stage IA, ER+/PR-, Her2neu neg. Stage I (T1 A. NX M0) invasive mammary carcinoma status post wide local excision in 2013 followed by Radiation therapy. On tamoxifen.   2. Recurrent Non-small cell lung cancer. 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 Completed radiation therapy (SBRT) in March 2015. 01/15/14 - PET scan. Progressive enlargement of previously described left lower lobe nodule which demonstrates low level hypermetabolic activity, suspicious for neoplasm such as a primary bronchogenic adenocarcinoma. 01/30/14 - Pathology Report. LUNG, LEFT LOWER LOBE; CT-GUIDED CORE BIOPSY: MUCINOUS ADENOCARCINOMA WITH LEPIDIC PATTERN. EGFR, ALK, ROS-1, RET all negative.  Completed course of radiation early January 2016.   HISTORY OF PRESENT ILLNESS:  Patient returns for continued oncology followup, she was again recently hospitalized with recurrent respiratory symptoms. STates she is still very weak and has DOE, uses O2. Does not ambulate much. No progressive chest pain or hemoptysis. No fevers. Appetite fair.    REVIEW OF SYSTEMS:   ROS As in HPI above. In addition, no new headaches or focal weakness.  No new sore throat or dysphagia. No abdominal pain, constipation, diarrhea, dysuria or hematuria. No new paresthesias in extremities. PS ECOG 2-3.  PAST MEDICAL HISTORY: Reviewed. Past Medical History  Diagnosis Date  . Hypertension   . Cellulitis and abscess of trunk   . Thyroid disease     hypo  . Arthritis   . Personal history of malignant neoplasm of breast   . Diabetes  mellitus without complication   . Senile osteoporosis   . Malignant neoplasm of upper-outer quadrant of female breast January 25, 2011    Extensive intraductal carcinoma with focal ( 96m) invasive cancer, T1a, N0, M0. ER 50%, PR 0, HER-2/neu nonamplified.. Tamoxifen chosen as bone density showed severe osteoporosis.  . Lung cancer, upper lobe February 2015    Adenocarcinoma with lepidic pattern, second primary fall 2015  . Breast cancer   . COPD (chronic obstructive pulmonary disease)   . Shortness of breath dyspnea   . Chronic kidney disease     PAST SURGICAL HISTORY: Reviewed. Past Surgical History  Procedure Laterality Date  . Abdominal hysterectomy  age 74 . Mammosite balloon placement Left 2012  . Cardiac catheterization  2014  . Breast surgery Left January 11, 2011    left breast stereotactic biopsy: DCIS  . Appendectomy    . Flexible sigmoidoscopy N/A 09/20/2014    Procedure: FLEXIBLE SIGMOIDOSCOPY;  Surgeon: RManya Silvas MD;  [facility]  Service: Endoscopy;  Laterality: N/A;    FAMILY HISTORY: Reviewed. Family History  Problem Relation Age of Onset  . Breast cancer Daughter   . Breast cancer Mother   . Diabetes Mother   . Hypertension Mother   . Hypertension Sister     SOCIAL HISTORY: Reviewed. Social History  Substance Use Topics  . Smoking status: Former Smoker -- 0.00 packs/day for 30 years  . Smokeless tobacco: Never Used  . Alcohol Use: No    Allergies  Allergen Reactions  . Hydrocodone-Acetaminophen Rash  . Macrodantin [Nitrofurantoin] Rash    Current Outpatient Prescriptions  Medication Sig Dispense Refill  . acetaminophen (TYLENOL) 325 MG tablet  Take 650 mg by mouth every 4 (four) hours as needed for mild pain, moderate pain or fever.    Marland Kitchen ADVAIR DISKUS 250-50 MCG/DOSE AEPB Inhale 1 puff into the lungs 2 (two) times daily.   5  . albuterol (PROVENTIL HFA;VENTOLIN HFA) 108 (90 BASE) MCG/ACT inhaler Inhale 2 puffs into the lungs  every 6 (six) hours as needed for wheezing or shortness of breath.     . ALPRAZolam (XANAX) 0.25 MG tablet Take 0.25 mg by mouth at bedtime as needed for anxiety.   3  . Alum & Mag Hydroxide-Simeth (MAG-AL PLUS XS PO) Take 30 mLs by mouth every 4 (four) hours as needed (for gas/ingestion.).    Marland Kitchen Amino Acids-Protein Hydrolys (FEEDING SUPPLEMENT, PRO-STAT SUGAR FREE 64,) LIQD Take 30 mLs by mouth 3 (three) times daily with meals.    Marland Kitchen aspirin EC 81 MG tablet Take 81 mg by mouth daily.    . bisacodyl (DULCOLAX) 10 MG suppository Place 10 mg rectally daily as needed for moderate constipation.    . calcium carbonate (OS-CAL) 600 MG TABS tablet Take 600 mg by mouth 2 (two) times daily with a meal.    . chlorpheniramine-HYDROcodone (TUSSIONEX PENNKINETIC ER) 10-8 MG/5ML SUER Take 5 mLs by mouth every 12 (twelve) hours as needed for cough. 140 mL 0  . Cholecalciferol 1000 UNITS TBDP Take 1,000 Units by mouth daily.     Marland Kitchen GLUCERNA (GLUCERNA) LIQD Take 1 Can by mouth 2 (two) times daily between meals.    Marland Kitchen guaiFENesin (MUCINEX) 600 MG 12 hr tablet Take 600 mg by mouth 2 (two) times daily.    . hydrALAZINE (APRESOLINE) 50 MG tablet Take 1 tablet (50 mg total) by mouth 2 (two) times daily. 60 tablet 3  . hydrocortisone cream 1 % Apply 1 application topically 4 (four) times daily as needed (for dry hemorrhoids.).    Marland Kitchen insulin aspart (NOVOLOG) 100 UNIT/ML injection Inject 4 Units into the skin 3 (three) times daily before meals. 10 mL 11  . insulin glargine (LANTUS) 100 UNIT/ML injection Inject 0.2 mLs (20 Units total) into the skin at bedtime. 10 mL 11  . ipratropium-albuterol (DUONEB) 0.5-2.5 (3) MG/3ML SOLN Inhale 3 mLs into the lungs See admin instructions. Use 1 vial (243m) via nebulizer every 8 hours, and 1 vial (377m) via nebulizer every 4 hours as needed for wheezing /shortness of breath.  5  . levothyroxine (SYNTHROID, LEVOTHROID) 100 MCG tablet Take 100 mcg by mouth daily before breakfast. Take along witg  88 mcg tablet for total dose of 188 mcg.    . levothyroxine (SYNTHROID, LEVOTHROID) 88 MCG tablet Take 88 mcg by mouth daily before breakfast. Take along with 100 mcg tablet for a total dose of 188 mcg.    . lovastatin (MEVACOR) 20 MG tablet Take 1 tablet by mouth daily.    . magnesium hydroxide (MILK OF MAGNESIA) 400 MG/5ML suspension Take 30 mLs by mouth daily as needed for mild constipation or moderate constipation.    . Marland Kitchenorphine (ROXANOL) 20 MG/ML concentrated solution Take 5-10 mg by mouth See admin instructions. Take 43m543m0.243m58mvery 4 hours. May take 5-10mg58m243ml-42ml) every hour as needed for pain or dyspnea    . oxyCODONE-acetaminophen (PERCOCET/ROXICET) 5-325 MG per tablet Take 1 tablet by mouth every 6 (six) hours as needed (chest pain). 30 tablet 0  . sennosides-docusate sodium (SENOKOT-S) 8.6-50 MG tablet Take 2 tablets by mouth 2 (two) times daily.    . tamoxifen (NOLVADEX) 10 MG tablet  Take 20 mg by mouth daily.    Marland Kitchen tiotropium (SPIRIVA) 18 MCG inhalation capsule Place 18 mcg into inhaler and inhale daily.     No current facility-administered medications for this visit.    PHYSICAL EXAM: Filed Vitals:   11/08/14 1411  BP: 172/72  Pulse: 94  Temp: 99.3 F (37.4 C)  Resp: 18     Body mass index is 27.94 kg/(m^2).     GENERAL: Patient is chronically weak, sitting in wheelchair, otherwise alert and oriented and in no acute distress. There is no icterus. HEENT: EOMs intact. No cervical lymphadenopathy. CVS: S1S2, regular LUNGS: Bilaterally diminished breath sounds more so in LLL area, no rhonchi. ABDOMEN: Soft, nontender.   EXTREMITIES: No pedal edema.   LAB RESULTS:    Component Value Date/Time   NA 140 10/11/2014 0450   NA 141 12/28/2013 1115   K 3.7 10/11/2014 0450   K 3.8 12/28/2013 1115   CL 101 10/11/2014 0450   CL 104 12/28/2013 1115   CO2 30 10/11/2014 0450   CO2 26 12/28/2013 1115   GLUCOSE 119* 10/11/2014 0450   GLUCOSE 146* 12/28/2013 1115   BUN  101* 10/11/2014 0450   BUN 53* 12/28/2013 1115   CREATININE 2.91* 10/11/2014 0450   CREATININE 1.45* 06/26/2014 1401   CALCIUM 8.2* 10/11/2014 0450   CALCIUM 8.1* 12/28/2013 1115   PROT 6.1* 10/02/2014 1416   PROT 6.1* 06/26/2014 1401   ALBUMIN 2.2* 10/11/2014 0450   ALBUMIN 2.7* 06/26/2014 1401   AST 24 10/02/2014 1416   AST 24 06/26/2014 1401   ALT 28 10/02/2014 1416   ALT 28 06/26/2014 1401   ALKPHOS 81 10/02/2014 1416   ALKPHOS 76 06/26/2014 1401   BILITOT 0.4 10/02/2014 1416   BILITOT 0.3 06/26/2014 1401   GFRNONAA 15* 10/11/2014 0450   GFRNONAA 35* 06/26/2014 1401   GFRNONAA 33* 03/27/2014 1541   GFRAA 17* 10/11/2014 0450   GFRAA 40* 06/26/2014 1401   GFRAA 40* 03/27/2014 1541    Lab Results  Component Value Date   WBC 8.2 10/11/2014   NEUTROABS 6.1 10/11/2014   HGB 8.2* 10/11/2014   HCT 25.5* 10/11/2014   MCV 83.5 10/11/2014   PLT 155 10/11/2014    STUDIES: 01/15/14 - PET scan. IMPRESSION:  1. Progressive enlargement of previously described left lower lobe nodule which demonstrates low level hypermetabolic activity, suspicious for neoplasm such as a primary bronchogenic adenocarcinoma. 2. However, there are also evolving adjacent areas of peribronchovascular ground-glass attenuation and nodular airspace disease in the left lower lobe, and to a lesser extent in the medial segment of the low right middle lobe, which also demonstrate low-level hypermetabolic activity. These other areas are favored to be of infectious or inflammatory etiology rather than neoplastic, but continued attention on future followup studies is recommended.  3. 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  4. Areas of hypermetabolism in the region of the vulva near the introitus, and in the region of the anal canal. These findings could simply be related to urinary contamination, however, vulvar  soft tissues appear thickened. The possibility of primary malignancies in these regions are not excluded, and correlation with physical examination is suggested.  5. Atherosclerosis, including left main and 3 vessel coronary artery disease. Assessment for potential risk factor modification, dietary therapy or pharmacologic therapy may be warranted, if clinically indicated.  01/30/14 - Pathology Report. LUNG, LEFT LOWER LOBE;  CT-GUIDED CORE BIOPSY: MUCINOUS ADENOCARCINOMA WITH LEPIDIC PATTERN.   COMMENT: In this sample the tumor is histologically similar to the previously biopsied mass in left upper lobe ([id number],03/26/13), which was reviewed. Tumors of this type have a strong tendency for multicentric, multilobar, and bilateral lung involvement. In additional, metastatic adenocarcinoma of non-pulmonary origin could have this histologic appearance, so clinical correlation is needed.  03/07/14 - Mammogram. IMPRESSION: No mammographic evidence of malignancy. RECOMMENDATION: Recommend annual diagnostic mammograms. BI-RADS CATEGORY  2: Benign.  06/24/14 - CT chest without contrast. IMPRESSION:  1. Left lower lobe nodule is stable and remains worrisome for primary bronchogenic carcinoma. 2. Resolved right middle lobe subpleural airspace consolidation, indicative of an infectious or inflammatory etiology. 3. Scattered peribronchovascular ground-glass densities in the right lung are new and likely infectious or inflammatory in etiology. 4. Post radiation changes in the lingula and left lower lobe. 5. Extensive 3 vessel coronary artery calcification. 6. Mixed attenuation extrapleural mass in the apex of the right hemithorax, as on prior exams, indicative of a benign lesion such as a neurogenic tumor.  08/28/14 - CT Chest wo contrast. IMPRESSION:  1. Chronic nodular and reticular nodular airspace opacities and interstitial opacities in the left lower lobe and lingula suggests chronic infection. No significant  change from CT of 06/24/2014.  2. Mild branching nodular airspace disease in the right lower lobe is new. This could represent early or resolving pneumonia.  3. 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  09/19/14 - PET Scan.  FINDINGS:  NECK  No hypermetabolic lymph nodes in the neck. CHEST  Right paratracheal lymph node measures 1.1 cm and has an SUV max equal to 3.26. Previously this lymph node measured 0.9 cm and had an SUV max equal to 2.7. Moderate volume left pleural effusion identified. Overlying compressive type atelectasis is noted. Left lower lobe lung nodule is identified posteriorly measuring 1.8 cm. This has an SUV max equal to 3.53. Stable appearance of subpleural nodule within the posterior medial right apex measuring 3.3 cm, image 55/series 3. No significant FDG uptake is within this structure. ABDOMEN/PELVIS  Small nonspecific focus of increased uptake within the lower rectum/anus has an SUV max equal to 10.0. This is compared with 5.2 previously. There is no abnormal hyper metabolism within the liver, spleen, adrenal glands, or pancreas. The visualized portions of stress that aortic atherosclerosis noted. No hypermetabolic upper abdominal or pelvic lymph nodes identified. SKELETON  No focal hypermetabolic activity to suggest skeletal metastasis. IMPRESSION:   1. Mild increase in size and degree of FDG uptake associated with right paratracheal lymph node. Additionally, there is a new left pleural effusion and a persistent focus of hyper metabolism within the posterior left lung base. Residual tumor cannot be excluded. 2. Stable non hypermetabolic low density, subpleural mass within the posterior medial right apex. 3. Intense focus of increased uptake is identified within the lower rectum/anus area. Correlation with direct visualization would be advised.   ASSESSMENT / PLAN:   1. Non-small cell lung cancer. Biopsy  positivie for Adenocarcinoma with lepidic features in January 2015. Completed radiation therapy (SBRT) in March 2015 - PET scan shows abnormal activity in left lower lung area. 01/30/14 - Pathology Report. LUNG, LEFT LOWER LOBE; CT-GUIDED CORE BIOPSY: MUCINOUS ADENOCARCINOMA WITH LEPIDIC PATTERN. EGFR, ALK, ROS-1, RET all negative. Completed course of radiation early January 2016. Most recent PET scan reports mildly increased hypermetabolic activity in left lower lung and right paratracheal node, this  could be from residual/recurrent malignancy versus recent respiratory tract infection. Patient is not a candidate for systemic therapy like chemotherapy (not a candidate for targeted therapy since molecular tests were negative). Per d/w Dr.Chrystal today, no plans for more radiation and recommended monitoring. Patient is agreeable to this approach. She does understand that overall prognosis is poor if she has recurrent lung cancer along with multiple other co-morbidities. Currently no progressive pain, no hemoptysis. Will keep scheduled f/u on Oct 26 the same. Patient and her son present state they want to hold off doing CT scans unless she has progressive/new symptoms.   2. Abnormal PET activity in vulvar area - s/p gyn-oncologist evaluation in past, felt not a significant finding to indicate malignancy. 3. History of Breast Ca Stage IA, ER+/PR-, Her2neu neg - on hormonal therapy with tamoxifen. Review of DEXA scan in January 2013 showed osteoporosis with a T-score of -2.9. Have discussed continuing hormonal therapy options including aromatase inhibitor, given h/o osteoporosis, patient prefers to stay on tamoxifen 20 mg by mouth daily. Recent mammogram January was reported benign, BI-RADS 2. Plan is continued surveillance. 4. Renal insufficiency - states she is seeing nephrology. Encouraged her to maintain adequate oral fluid intake. In between visits, patient advised to call or come to ER in case of any new  symptoms or acute sickness. She is agreeable to this plan.     Leia Alf, MD   11/26/2014 5:50 AM

## 2014-12-25 ENCOUNTER — Inpatient Hospital Stay: Payer: Commercial Managed Care - HMO | Admitting: Family Medicine

## 2014-12-25 ENCOUNTER — Inpatient Hospital Stay: Payer: Commercial Managed Care - HMO

## 2014-12-25 ENCOUNTER — Ambulatory Visit: Payer: Commercial Managed Care - HMO | Admitting: Radiation Oncology

## 2014-12-25 ENCOUNTER — Inpatient Hospital Stay: Payer: Commercial Managed Care - HMO | Attending: Family Medicine | Admitting: Oncology

## 2014-12-25 VITALS — BP 191/71 | HR 86 | Temp 99.1°F | Wt 140.0 lb

## 2014-12-25 DIAGNOSIS — I10 Essential (primary) hypertension: Secondary | ICD-10-CM | POA: Insufficient documentation

## 2014-12-25 DIAGNOSIS — E119 Type 2 diabetes mellitus without complications: Secondary | ICD-10-CM | POA: Diagnosis not present

## 2014-12-25 DIAGNOSIS — C3432 Malignant neoplasm of lower lobe, left bronchus or lung: Secondary | ICD-10-CM | POA: Diagnosis not present

## 2014-12-25 DIAGNOSIS — R5383 Other fatigue: Secondary | ICD-10-CM

## 2014-12-25 DIAGNOSIS — C50412 Malignant neoplasm of upper-outer quadrant of left female breast: Secondary | ICD-10-CM | POA: Insufficient documentation

## 2014-12-25 DIAGNOSIS — Z794 Long term (current) use of insulin: Secondary | ICD-10-CM | POA: Insufficient documentation

## 2014-12-25 DIAGNOSIS — R0609 Other forms of dyspnea: Secondary | ICD-10-CM | POA: Diagnosis not present

## 2014-12-25 DIAGNOSIS — J449 Chronic obstructive pulmonary disease, unspecified: Secondary | ICD-10-CM

## 2014-12-25 DIAGNOSIS — E039 Hypothyroidism, unspecified: Secondary | ICD-10-CM

## 2014-12-25 DIAGNOSIS — Z79899 Other long term (current) drug therapy: Secondary | ICD-10-CM | POA: Insufficient documentation

## 2014-12-25 DIAGNOSIS — N289 Disorder of kidney and ureter, unspecified: Secondary | ICD-10-CM

## 2014-12-25 DIAGNOSIS — I7090 Unspecified atherosclerosis: Secondary | ICD-10-CM | POA: Diagnosis not present

## 2014-12-25 DIAGNOSIS — J90 Pleural effusion, not elsewhere classified: Secondary | ICD-10-CM | POA: Diagnosis not present

## 2014-12-25 DIAGNOSIS — R531 Weakness: Secondary | ICD-10-CM | POA: Diagnosis not present

## 2014-12-25 DIAGNOSIS — Z170 Estrogen receptor positive status [ER+]: Secondary | ICD-10-CM | POA: Insufficient documentation

## 2014-12-25 DIAGNOSIS — Z803 Family history of malignant neoplasm of breast: Secondary | ICD-10-CM | POA: Diagnosis not present

## 2014-12-25 DIAGNOSIS — Z79810 Long term (current) use of selective estrogen receptor modulators (SERMs): Secondary | ICD-10-CM | POA: Diagnosis not present

## 2014-12-25 DIAGNOSIS — C3492 Malignant neoplasm of unspecified part of left bronchus or lung: Secondary | ICD-10-CM

## 2014-12-25 DIAGNOSIS — Z923 Personal history of irradiation: Secondary | ICD-10-CM | POA: Diagnosis not present

## 2014-12-25 DIAGNOSIS — Z7982 Long term (current) use of aspirin: Secondary | ICD-10-CM | POA: Diagnosis not present

## 2014-12-25 LAB — CBC WITH DIFFERENTIAL/PLATELET
BASOS ABS: 0.1 10*3/uL (ref 0–0.1)
BASOS PCT: 0 %
EOS PCT: 0 %
Eosinophils Absolute: 0 10*3/uL (ref 0–0.7)
HCT: 28.4 % — ABNORMAL LOW (ref 35.0–47.0)
Hemoglobin: 8.8 g/dL — ABNORMAL LOW (ref 12.0–16.0)
Lymphocytes Relative: 8 %
Lymphs Abs: 0.9 10*3/uL — ABNORMAL LOW (ref 1.0–3.6)
MCH: 23.8 pg — ABNORMAL LOW (ref 26.0–34.0)
MCHC: 31.2 g/dL — AB (ref 32.0–36.0)
MCV: 76.5 fL — AB (ref 80.0–100.0)
MONO ABS: 0.6 10*3/uL (ref 0.2–0.9)
MONOS PCT: 5 %
Neutro Abs: 10.4 10*3/uL — ABNORMAL HIGH (ref 1.4–6.5)
Neutrophils Relative %: 87 %
PLATELETS: 268 10*3/uL (ref 150–440)
RBC: 3.71 MIL/uL — ABNORMAL LOW (ref 3.80–5.20)
RDW: 16.1 % — AB (ref 11.5–14.5)
WBC: 12 10*3/uL — ABNORMAL HIGH (ref 3.6–11.0)

## 2014-12-25 LAB — HEPATIC FUNCTION PANEL
ALT: 14 U/L (ref 14–54)
AST: 22 U/L (ref 15–41)
Albumin: 2.6 g/dL — ABNORMAL LOW (ref 3.5–5.0)
Alkaline Phosphatase: 76 U/L (ref 38–126)
BILIRUBIN TOTAL: 0.2 mg/dL — AB (ref 0.3–1.2)
Total Protein: 6.3 g/dL — ABNORMAL LOW (ref 6.5–8.1)

## 2014-12-25 LAB — CREATININE, SERUM
CREATININE: 1.81 mg/dL — AB (ref 0.44–1.00)
GFR, EST AFRICAN AMERICAN: 30 mL/min — AB (ref >60)
GFR, EST NON AFRICAN AMERICAN: 26 mL/min — AB (ref >60)

## 2014-12-25 MED ORDER — ONDANSETRON HCL 8 MG PO TABS: 8.0000 mg | ORAL_TABLET | Freq: Three times a day (TID) | ORAL | Status: AC | PRN

## 2015-01-01 ENCOUNTER — Ambulatory Visit
Admission: RE | Admit: 2015-01-01 | Discharge: 2015-01-01 | Disposition: A | Discharge status: Home / Self Care | Payer: Commercial Managed Care - HMO | Source: Ambulatory Visit | Attending: Radiation Oncology | Admitting: Radiation Oncology

## 2015-01-01 ENCOUNTER — Other Ambulatory Visit: Payer: Self-pay | Admitting: *Deleted

## 2015-01-01 ENCOUNTER — Encounter: Payer: Self-pay | Admitting: Radiation Oncology

## 2015-01-01 DIAGNOSIS — Z85118 Personal history of other malignant neoplasm of bronchus and lung: Secondary | ICD-10-CM

## 2015-01-01 NOTE — Progress Notes (Signed)
Radiation Oncology Follow up Note  Name: Whitney Wright   Date:   01/01/2015 MRN:  [id number] DOB: March 20, 1940    This 74 y.o. female presents to the clinic today for follow-up for lung cancer.  REFERRING PROVIDER: Tracie Harrier, MD  HPI: Patient is a 74 year old female who was completed 2 courses of SB RT radiation therapy for biopsy-positive adenocarcinoma with lipidic features treated back in January 2015 in the left lower lobe mucinous adenocarcinoma with lipidic pattern treated back in January 2016. She also has history of stage I a ER/PR positive HER-2/neu negative invasive mammary carcinoma. She has been on tamoxifen.. She does have multiple comorbidities including renal insufficiency. She seen today in routine follow-up has a slight nonproductive cough she is on continuous nasal oxygen. She is wheelchair-bound. She has refused repeat CT scans in the past although today I discussed that with her and she has agreed to a CT scan with contrast for follow-up. She's also requested a walker today for ambulatory assistance.  COMPLICATIONS OF TREATMENT: none  FOLLOW UP COMPLIANCE: keeps appointments   PHYSICAL EXAM:  There were no vitals taken for this visit. Elderly frail female wheelchair-bound with nasal oxygen in NAD. Well-developed well-nourished patient in NAD. HEENT reveals PERLA, EOMI, discs not visualized.  Oral cavity is clear. No oral mucosal lesions are identified. Neck is clear without evidence of cervical or supraclavicular adenopathy. Lungs are clear to A&P. Cardiac examination is essentially unremarkable with regular rate and rhythm without murmur rub or thrill. Abdomen is benign with no organomegaly or masses noted. Motor sensory and DTR levels are equal and symmetric in the upper and lower extremities. Cranial nerves II through XII are grossly intact. Proprioception is intact. No peripheral adenopathy or edema is identified. No motor or sensory levels are noted. Crude  visual fields are within normal range.  RADIOLOGY RESULTS: CT scan with contrast ordered  PLAN: Present time she appears stable. I'm please were overall progress. I've asked to see her back in 6 months for follow-up. Will review her CT scan when it becomes available. She continues close follow-up care with medical oncology. Son was present today for consultation.  I would like to take this opportunity for allowing me to participate in the care of your patient.Armstead Peaks., MD

## 2015-01-04 NOTE — Progress Notes (Signed)
[place]  [phone] [phone]     ID: Whitney Wright OB: [birth year]  MR#: [id number]  FHL#:[id number]  Patient Care Team: Tracie Harrier, MD as PCP - General (Internal Medicine) Robert Bellow, MD (General Surgery) Tracie Harrier, MD as Physician Assistant (Internal Medicine)  CHIEF COMPLAINT/DIAGNOSIS:  1. History of Breast Cancer Stage Ia, ER+/PR-, Her2neu neg. Stage I (T1aNxM0) invasive mammary carcinoma status post wide local excision in 2013 followed by Radiation therapy. On tamoxifen.   2. Recurrent Non-small cell lung cancer. 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 Completed radiation therapy (SBRT) in March 2015.  01/15/14 - PET scan. Progressive enlargement of previously described left lower lobe nodule which demonstrates low level hypermetabolic activity, suspicious for neoplasm such as a primary bronchogenic adenocarcinoma.  01/30/14 - Pathology Report. LUNG, LEFT LOWER LOBE; CT-GUIDED CORE BIOPSY: MUCINOUS ADENOCARCINOMA WITH LEPIDIC PATTERN. EGFR, ALK, ROS-1, RET all negative. Completed course of radiation early January 2016.   HISTORY OF PRESENT ILLNESS:   Patient returns to clinic today for repeat laboratory work and further evaluation. She continues to tolerate tamoxifen without significant side effects. She currently feels well and is at her baseline. She continues to have persistent dyspnea on exertion. She denies any fevers. She has good appetite and denies weight loss. She denies any pain. She has no neurologic complaints. She denies any chest pain, cough, or hemoptysis. She denies any nausea, vomiting, constipation, or diarrhea. She has no urinary complaints. Patient otherwise feels well and offers no further specific complaints.    REVIEW OF SYSTEMS:   Review of Systems  Constitutional: Positive for malaise/fatigue. Negative for fever and weight loss.  Respiratory: Positive for  shortness of breath. Negative for cough and hemoptysis.   Cardiovascular: Negative.  Negative for chest pain.  Gastrointestinal: Negative.   Musculoskeletal: Negative.   Neurological: Positive for weakness.    PAST MEDICAL HISTORY: Reviewed. Past Medical History  Diagnosis Date  . Hypertension   . Cellulitis and abscess of trunk   . Thyroid disease     hypo  . Arthritis   . Personal history of malignant neoplasm of breast   . Diabetes mellitus without complication ([place])   . Senile osteoporosis   . Malignant neoplasm of upper-outer quadrant of female breast ([place]) January 25, 2011    Extensive intraductal carcinoma with focal ( 80m) invasive cancer, T1a, N0, M0. ER 50%, PR 0, HER-2/neu nonamplified.. Tamoxifen chosen as bone density showed severe osteoporosis.  . Lung cancer[facility] upper lobe ([facility] February 2015    Adenocarcinoma with lepidic pattern, second primary fall 2015  . [place]   . [place]   . Shortness of breath dyspnea   . Chronic kidney disease     PAST SURGICAL HISTORY: Reviewed. Past Surgical History  Procedure Laterality Date  . Abdominal hysterectomy  age 74 . Mammosite balloon placement Left 2012  . Cardiac catheterization  2014  . Breast surgery Left January 11, 2011    left breast stereotactic biopsy: DCIS  . Appendectomy    . Flexible sigmoidoscopy N/A 09/20/2014    Procedure: FLEXIBLE SIGMOIDOSCOPY;  Surgeon: RManya Silvas MD;  [facility];  Service: Endoscopy;  Laterality: N/A;    FAMILY HISTORY: Reviewed. Family History  Problem Relation Age of Onset  . Breast cancer Daughter   . Breast cancer Mother   . Diabetes Mother   . Hypertension Mother   . Hypertension Sister  SOCIAL HISTORY: Reviewed. Social History  Substance Use Topics  . Smoking status: Former Smoker -- 0.00 packs/day for 30 years  . Smokeless tobacco: Never Used  . Alcohol Use: No    Allergies  Allergen  Reactions  . Hydrocodone-Acetaminophen Rash  . Macrodantin [Nitrofurantoin] Rash    Current Outpatient Prescriptions  Medication Sig Dispense Refill  . acetaminophen (TYLENOL) 325 MG tablet Take 650 mg by mouth every 4 (four) hours as needed for mild pain, moderate pain or fever.    Marland Kitchen ADVAIR DISKUS 250-50 MCG/DOSE AEPB Inhale 1 puff into the lungs 2 (two) times daily.   5  . albuterol (PROVENTIL HFA;VENTOLIN HFA) 108 (90 BASE) MCG/ACT inhaler Inhale 2 puffs into the lungs every 6 (six) hours as needed for wheezing or shortness of breath.     . ALPRAZolam (XANAX) 0.25 MG tablet Take 0.25 mg by mouth at bedtime as needed for anxiety.   3  . Alum & Mag Hydroxide-Simeth (MAG-AL PLUS XS PO) Take 30 mLs by mouth every 4 (four) hours as needed (for gas/ingestion.).    Marland Kitchen aspirin EC 81 MG tablet Take 81 mg by mouth daily.    . bisacodyl (DULCOLAX) 10 MG suppository Place 10 mg rectally daily as needed for moderate constipation.    . calcium carbonate (OS-CAL) 600 MG TABS tablet Take 600 mg by mouth 2 (two) times daily with a meal.    . chlorpheniramine-HYDROcodone (TUSSIONEX PENNKINETIC ER) 10-8 MG/5ML SUER Take 5 mLs by mouth every 12 (twelve) hours as needed for cough. 140 mL 0  . Cholecalciferol 1000 UNITS TBDP Take 1,000 Units by mouth daily.     Marland Kitchen guaiFENesin (MUCINEX) 600 MG 12 hr tablet Take 600 mg by mouth 2 (two) times daily.    . hydrALAZINE (APRESOLINE) 50 MG tablet Take 1 tablet (50 mg total) by mouth 2 (two) times daily. 60 tablet 3  . hydrocortisone cream 1 % Apply 1 application topically 4 (four) times daily as needed (for dry hemorrhoids.).    Marland Kitchen insulin aspart (NOVOLOG) 100 UNIT/ML injection Inject 4 Units into the skin 3 (three) times daily before meals. 10 mL 11  . insulin glargine (LANTUS) 100 UNIT/ML injection Inject 0.2 mLs (20 Units total) into the skin at bedtime. 10 mL 11  . ipratropium-albuterol (DUONEB) 0.5-2.5 (3) MG/3ML SOLN Inhale 3 mLs into the lungs See admin instructions.  Use 1 vial (75m) via nebulizer every 8 hours, and 1 vial (360m) via nebulizer every 4 hours as needed for wheezing /shortness of breath.  5  . levothyroxine (SYNTHROID, LEVOTHROID) 100 MCG tablet Take 100 mcg by mouth daily before breakfast. Take along witg 88 mcg tablet for total dose of 188 mcg.    . levothyroxine (SYNTHROID, LEVOTHROID) 88 MCG tablet Take 88 mcg by mouth daily before breakfast. Take along with 100 mcg tablet for a total dose of 188 mcg.    . lovastatin (MEVACOR) 20 MG tablet Take 1 tablet by mouth daily.    . magnesium hydroxide (MILK OF MAGNESIA) 400 MG/5ML suspension Take 30 mLs by mouth daily as needed for mild constipation or moderate constipation.    . Marland KitchenxyCODONE-acetaminophen (PERCOCET/ROXICET) 5-325 MG per tablet Take 1 tablet by mouth every 6 (six) hours as needed (chest pain). 30 tablet 0  . sennosides-docusate sodium (SENOKOT-S) 8.6-50 MG tablet Take 2 tablets by mouth 2 (two) times daily.    . tamoxifen (NOLVADEX) 10 MG tablet Take 20 mg by mouth daily.    . Marland Kitcheniotropium (SPIRIVA) 18  MCG inhalation capsule Place 18 mcg into inhaler and inhale daily.    . Amino Acids-Protein Hydrolys (FEEDING SUPPLEMENT, PRO-STAT SUGAR FREE 64,) LIQD Take 30 mLs by mouth 3 (three) times daily with meals.    Marland Kitchen GLUCERNA (GLUCERNA) LIQD Take 1 Can by mouth 2 (two) times daily between meals.    Marland Kitchen morphine (ROXANOL) 20 MG/ML concentrated solution Take 5-10 mg by mouth See admin instructions. Take 69m (0.252m every 4 hours. May take 5-1071m0.25m72m5ml) every hour as needed for pain or dyspnea    . ondansetron (ZOFRAN) 8 MG tablet Take 1 tablet (8 mg total) by mouth every 8 (eight) hours as needed for nausea or vomiting. 21 tablet 2   No current facility-administered medications for this visit.    PHYSICAL EXAM: Filed Vitals:   12/25/14 1412  BP: 191/71  Pulse: 86  Temp: 99.1 F (37.3 C)     Body mass index is 27.34 kg/(m^2).      General: Well-developed, well-nourished, no acute  distress. Eyes: Pink conjunctiva, anicteric sclera. Breasts: Patient requested exam be deferred today. Lungs: Diminished breath sounds bilaterally. Heart: Regular rate and rhythm. No rubs, murmurs, or gallops. Abdomen: Soft, nontender, nondistended. No organomegaly noted, normoactive bowel sounds. Musculoskeletal: No edema, cyanosis, or clubbing. Neuro: Alert, answering all questions appropriately. Cranial nerves grossly intact. Skin: No rashes or petechiae noted. Psych: Normal affect.   LAB RESULTS:    Component Value Date/Time   NA 140 10/11/2014 0450   NA 141 12/28/2013 1115   K 3.7 10/11/2014 0450   K 3.8 12/28/2013 1115   CL 101 10/11/2014 0450   CL 104 12/28/2013 1115   CO2 30 10/11/2014 0450   CO2 26 12/28/2013 1115   GLUCOSE 119* 10/11/2014 0450   GLUCOSE 146* 12/28/2013 1115   BUN 101* 10/11/2014 0450   BUN 53* 12/28/2013 1115   CREATININE 1.81* 12/25/2014 1336   CREATININE 1.45* 06/26/2014 1401   CALCIUM 8.2* 10/11/2014 0450   CALCIUM 8.1* 12/28/2013 1115   PROT 6.3* 12/25/2014 1336   PROT 6.1* 06/26/2014 1401   ALBUMIN 2.6* 12/25/2014 1336   ALBUMIN 2.7* 06/26/2014 1401   AST 22 12/25/2014 1336   AST 24 06/26/2014 1401   ALT 14 12/25/2014 1336   ALT 28 06/26/2014 1401   ALKPHOS 76 12/25/2014 1336   ALKPHOS 76 06/26/2014 1401   BILITOT 0.2* 12/25/2014 1336   BILITOT 0.3 06/26/2014 1401   GFRNONAA 26* 12/25/2014 1336   GFRNONAA 35* 06/26/2014 1401   GFRNONAA 33* 03/27/2014 1541   GFRAA 30* 12/25/2014 1336   GFRAA 40* 06/26/2014 1401   GFRAA 40* 03/27/2014 1541    Lab Results  Component Value Date   WBC 12.0* 12/25/2014   NEUTROABS 10.4* 12/25/2014   HGB 8.8* 12/25/2014   HCT 28.4* 12/25/2014   MCV 76.5* 12/25/2014   PLT 268 12/25/2014    STUDIES: 01/15/14 - PET scan. IMPRESSION:  1. Progressive enlargement of previously described left lower lobe nodule which demonstrates low level hypermetabolic activity, suspicious for neoplasm such as a primary  bronchogenic adenocarcinoma. 2. However, there are also evolving adjacent areas of peribronchovascular ground-glass attenuation and nodular airspace disease in the left lower lobe, and to a lesser extent in the medial segment of the low right middle lobe, which also demonstrate low-level hypermetabolic activity. These other areas are favored to be of infectious or inflammatory etiology rather than neoplastic, but continued attention on future followup studies is recommended.  3. 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  4. Areas of hypermetabolism in the region of the vulva near the introitus, and in the region of the anal canal. These findings could simply be related to urinary contamination, however, vulvar soft tissues appear thickened. The possibility of primary malignancies in these regions are not excluded, and correlation with physical examination is suggested.  5. Atherosclerosis, including left main and 3 vessel coronary artery disease. Assessment for potential risk factor modification, dietary therapy or pharmacologic therapy may be warranted, if clinically indicated.  01/30/14 - Pathology Report. LUNG, LEFT LOWER LOBE; CT-GUIDED CORE BIOPSY: MUCINOUS ADENOCARCINOMA WITH LEPIDIC PATTERN.   COMMENT: In this sample the tumor is histologically similar to the previously biopsied mass in left upper lobe ([id number],03/26/13), which was reviewed. Tumors of this type have a strong tendency for multicentric, multilobar, and bilateral lung involvement. In additional, metastatic adenocarcinoma of non-pulmonary origin could have this histologic appearance, so clinical correlation is needed.  03/07/14 - Mammogram. IMPRESSION: No mammographic evidence of malignancy. RECOMMENDATION: Recommend annual diagnostic mammograms. BI-RADS CATEGORY  2: Benign.  06/24/14 - CT chest without contrast. IMPRESSION:  1.  Left lower lobe nodule is stable and remains worrisome for primary bronchogenic carcinoma. 2. Resolved right middle lobe subpleural airspace consolidation, indicative of an infectious or inflammatory etiology. 3. Scattered peribronchovascular ground-glass densities in the right lung are new and likely infectious or inflammatory in etiology. 4. Post radiation changes in the lingula and left lower lobe. 5. Extensive 3 vessel coronary artery calcification. 6. Mixed attenuation extrapleural mass in the apex of the right hemithorax, as on prior exams, indicative of a benign lesion such as a neurogenic tumor.  08/28/14 - CT Chest wo contrast. IMPRESSION:  1. Chronic nodular and reticular nodular airspace opacities and interstitial opacities in the left lower lobe and lingula suggests chronic infection. No significant change from CT of 06/24/2014.  2. Mild branching nodular airspace disease in the right lower lobe is new. This could represent early or resolving pneumonia.  3. 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  09/19/14 - PET Scan.  FINDINGS:  NECK  No hypermetabolic lymph nodes in the neck. CHEST  Right paratracheal lymph node measures 1.1 cm and has an SUV max equal to 3.26. Previously this lymph node measured 0.9 cm and had an SUV max equal to 2.7. Moderate volume left pleural effusion identified. Overlying compressive type atelectasis is noted. Left lower lobe lung nodule is identified posteriorly measuring 1.8 cm. This has an SUV max equal to 3.53. Stable appearance of subpleural nodule within the posterior medial right apex measuring 3.3 cm, image 55/series 3. No significant FDG uptake is within this structure. ABDOMEN/PELVIS  Small nonspecific focus of increased uptake within the lower rectum/anus has an SUV max equal to 10.0. This is compared with 5.2 previously. There is no abnormal hyper metabolism within the liver, spleen, adrenal  glands, or pancreas. The visualized portions of stress that aortic atherosclerosis noted. No hypermetabolic upper abdominal or pelvic lymph nodes identified. SKELETON  No focal hypermetabolic activity to suggest skeletal metastasis. IMPRESSION:   1. Mild increase in size and degree of FDG uptake associated with right paratracheal lymph node. Additionally, there is a new left pleural effusion and a persistent focus of hyper metabolism within the posterior left lung base. Residual tumor cannot be excluded. 2. Stable non hypermetabolic low density, subpleural mass within the posterior medial  right apex. 3. Intense focus of increased uptake is identified within the lower rectum/anus area. Correlation with direct visualization would be advised.   ASSESSMENT / PLAN:    1. Non-small cell lung cancer:  Most recent PET from September 19, 2014 reviewed independently and reported scan reports mildly increased hypermetabolic activity in left lower lung and right paratracheal node, this could be from residual/recurrent malignancy versus recent respiratory tract infection. Given her declining performance status, patient is not a candidate for systemic chemotherapy. Previously, per radiation oncology there are no additional plans for XRT. Patient expressed understanding her overall prognosis is poor if she has recurrent lung cancer along with multiple other co-morbidities. Will not pursue any further imaging unless patient becomes symptomatic. 2. Breast cancer: No evidence of disease. Continue tamoxifen completing in approximately January 2018. Her most recent mammogram in January 2016 was reported as BI-RADS 2.  3. Renal insufficiency: Patient's creatinine is approximately her baseline. Appreciate nephrology input. 4. Hypertension: Patient's blood pressure is significantly elevated today. Continue current medications. Treatment per primary care. 5. Disposition: Return to clinic in 3 months for repeat laboratory work  and routine evaluation. Patient and her son expressed understanding and were in agreement with this plan.   Lloyd Huger, MD   01/04/2015 8:29 AM

## 2015-01-08 ENCOUNTER — Other Ambulatory Visit: Payer: Self-pay | Admitting: *Deleted

## 2015-01-08 ENCOUNTER — Ambulatory Visit
Admission: RE | Admit: 2015-01-08 | Discharge: 2015-01-08 | Disposition: A | Discharge status: Home / Self Care | Payer: Commercial Managed Care - HMO | Source: Ambulatory Visit | Attending: Radiation Oncology | Admitting: Radiation Oncology

## 2015-01-08 DIAGNOSIS — R918 Other nonspecific abnormal finding of lung field: Secondary | ICD-10-CM | POA: Insufficient documentation

## 2015-01-08 DIAGNOSIS — I2510 Atherosclerotic heart disease of native coronary artery without angina pectoris: Secondary | ICD-10-CM | POA: Insufficient documentation

## 2015-01-08 DIAGNOSIS — Z85118 Personal history of other malignant neoplasm of bronchus and lung: Secondary | ICD-10-CM | POA: Diagnosis present

## 2015-01-16 ENCOUNTER — Other Ambulatory Visit: Payer: Self-pay | Admitting: *Deleted

## 2015-01-16 DIAGNOSIS — C50412 Malignant neoplasm of upper-outer quadrant of left female breast: Secondary | ICD-10-CM

## 2015-01-29 ENCOUNTER — Encounter: Payer: Self-pay | Admitting: *Deleted

## 2015-02-18 ENCOUNTER — Encounter: Payer: Self-pay | Admitting: *Deleted

## 2015-02-18 ENCOUNTER — Other Ambulatory Visit: Payer: Self-pay | Admitting: *Deleted

## 2015-02-18 NOTE — Patient Outreach (Signed)
[facility]) Care Management  02/18/2015  Whitney Wright 07/23/1940 [id number]   Subjective: Telephone call to patient's home number.  Spoke with patient and HIPAA verified.   Discussed [facility] Care Management services.   Patient states she is not doing too good, can no longer live by herself and is currently living in a skilled nursing home.   States she has been living at [facility] ( skilled nursing facility) and will be there until she dies.   States she has no care management needs currently and the facility is very good to her.    Assessment: Received [facility] HMO tier 4 list referral on 02/17/15.   Diagnosis listed on referral is DM.  Patient has had 3 ED visits and 3 admits.   Patient is currently not eligible for [facility] Care Management services due to living long term in a skilled nursing facility.   Plan: RNCM will notify primary MD that case is closed due to ineligibility.  Patient currently resides in a long term skilled nursing facility.  RNCM will notify Whitney Wright to close the case due to program ineligibility because of patient residing long term in a skilled nursing facility.     Whitney Wright H. Annia Friendly, BSN, [place] Management [facility] Telephonic CM Phone: [phone] Fax: [phone]

## 2015-03-12 ENCOUNTER — Ambulatory Visit
Admission: RE | Admit: 2015-03-12 | Discharge: 2015-03-12 | Disposition: A | Discharge status: Home / Self Care | Payer: Medicare Other | Source: Ambulatory Visit | Attending: General Surgery | Admitting: General Surgery

## 2015-03-12 DIAGNOSIS — C50412 Malignant neoplasm of upper-outer quadrant of left female breast: Secondary | ICD-10-CM

## 2015-03-12 DIAGNOSIS — Z853 Personal history of malignant neoplasm of breast: Secondary | ICD-10-CM | POA: Diagnosis not present

## 2015-03-19 ENCOUNTER — Ambulatory Visit: Payer: Commercial Managed Care - HMO

## 2015-03-20 ENCOUNTER — Ambulatory Visit: Payer: Commercial Managed Care - HMO | Admitting: General Surgery

## 2015-03-24 ENCOUNTER — Ambulatory Visit (INDEPENDENT_AMBULATORY_CARE_PROVIDER_SITE_OTHER): Payer: Commercial Managed Care - HMO | Admitting: General Surgery

## 2015-03-24 ENCOUNTER — Ambulatory Visit: Payer: Commercial Managed Care - HMO

## 2015-03-24 ENCOUNTER — Encounter: Payer: Self-pay | Admitting: General Surgery

## 2015-03-24 VITALS — BP 138/74 | HR 81 | Resp 16 | Ht 60.0 in | Wt 126.0 lb

## 2015-03-24 DIAGNOSIS — R2232 Localized swelling, mass and lump, left upper limb: Secondary | ICD-10-CM

## 2015-03-24 DIAGNOSIS — C50912 Malignant neoplasm of unspecified site of left female breast: Secondary | ICD-10-CM

## 2015-03-24 NOTE — Progress Notes (Signed)
Patient ID: Whitney Wright, female   DOB: Jan 22, 1941, 76 y.o.   MRN: [id number]  Chief Complaint  Patient presents with  . Follow-up    mammogram    HPI Whitney Wright is a 75 y.o. female here today for a breast evaluation. The most recent mammogram was done on 03/12/15 .  Patient does not perform regular self breast checks and gets regular mammograms done.  Patient states no new breast changes. She states she has been on oxygen 3L and Dr. Lovie Macadamia has been treating her. She has not been eating a lot lately, does not have an appetite per patient.  The patient is now residing at [facility] as she was unable to care for herself at home. She is accompanied by an attendant from that facility.  I personally reviewed the patient's history.  HPI  Past Medical History  Diagnosis Date  . Hypertension   . Cellulitis and abscess of trunk   . Thyroid disease     hypo  . Arthritis   . Personal history of malignant neoplasm of breast   . Diabetes mellitus without complication ([place])   . Senile osteoporosis   . COPD (chronic obstructive pulmonary disease) ([place])   . Shortness of breath dyspnea   . Chronic kidney disease   . 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 ER 50%, PR 0, HER-2/neu nonamplified.. Tamoxifen chosen as bone density showed severe osteoporosis.  . Lung [facility] upper [facility] 2015    Adenocarcinoma with lepidic pattern, second primary fall 2015  . [place]    [place] breast with rad tx.     Past Surgical History  Procedure Laterality Date  . Abdominal hysterectomy  age 75 . Mammosite balloon placement Left 2012  . Cardiac catheterization  2014  . Breast surgery Left January 11, 2011    left breast stereotactic biopsy: DCIS  . Appendectomy    . Flexible sigmoidoscopy N/A 09/20/2014    Procedure: FLEXIBLE  SIGMOIDOSCOPY;  Surgeon: RManya Silvas MD;  [facility];  Service: Endoscopy;  Laterality: N/A;  . Breast biopsy Left 2010    benign  . Breast biopsy Left 2012    positive  . Breast cyst aspiration Right 2008    Family History  Problem Relation Age of Onset  . Breast cancer Daughter 413 . Breast cancer Mother 773 . Diabetes Mother   . Hypertension Mother   . Hypertension Sister     Social History Social History  Substance Use Topics  . Smoking status: Former Smoker -- 0.00 packs/day for 30 years  . Smokeless tobacco: Never Used  . Alcohol Use: No    Allergies  Allergen Reactions  . Hydrocodone-Acetaminophen Rash  . Macrodantin [Nitrofurantoin] Rash    Current Outpatient Prescriptions  Medication Sig Dispense Refill  . acetaminophen (TYLENOL) 325 MG tablet Take 650 mg by mouth every 4 (four) hours as needed for mild pain, moderate pain or fever.    .Marland KitchenADVAIR DISKUS 250-50 MCG/DOSE AEPB Inhale 1 puff into the lungs 2 (two) times daily.   5  . albuterol (PROVENTIL HFA;VENTOLIN HFA) 108 (90 BASE) MCG/ACT inhaler Inhale 2 puffs into the lungs every 6 (six) hours as needed for wheezing or shortness of breath.     . ALPRAZolam (XANAX) 0.25 MG tablet Take 0.25 mg by mouth at bedtime as  needed for anxiety.   3  . Alum & Mag Hydroxide-Simeth (MAG-AL PLUS XS PO) Take 30 mLs by mouth every 4 (four) hours as needed (for gas/ingestion.).    Marland Kitchen Amino Acids-Protein Hydrolys (FEEDING SUPPLEMENT, PRO-STAT SUGAR FREE 64,) LIQD Take 30 mLs by mouth 3 (three) times daily with meals.    Marland Kitchen aspirin EC 81 MG tablet Take 81 mg by mouth daily.    . bisacodyl (DULCOLAX) 10 MG suppository Place 10 mg rectally daily as needed for moderate constipation.    . calcium carbonate (OS-CAL) 600 MG TABS tablet Take 600 mg by mouth 2 (two) times daily with a meal.    . chlorpheniramine-HYDROcodone (TUSSIONEX PENNKINETIC ER) 10-8 MG/5ML SUER Take 5 mLs by mouth every 12 (twelve) hours as needed for  cough. 140 mL 0  . Cholecalciferol 1000 UNITS TBDP Take 1,000 Units by mouth daily.     Marland Kitchen GLUCERNA (GLUCERNA) LIQD Take 1 Can by mouth 2 (two) times daily between meals.    Marland Kitchen guaiFENesin (MUCINEX) 600 MG 12 hr tablet Take 600 mg by mouth 2 (two) times daily.    . hydrALAZINE (APRESOLINE) 50 MG tablet Take 1 tablet (50 mg total) by mouth 2 (two) times daily. 60 tablet 3  . hydrocortisone cream 1 % Apply 1 application topically 4 (four) times daily as needed (for dry hemorrhoids.).    Marland Kitchen insulin aspart (NOVOLOG) 100 UNIT/ML injection Inject 4 Units into the skin 3 (three) times daily before meals. 10 mL 11  . insulin glargine (LANTUS) 100 UNIT/ML injection Inject 0.2 mLs (20 Units total) into the skin at bedtime. 10 mL 11  . ipratropium-albuterol (DUONEB) 0.5-2.5 (3) MG/3ML SOLN Inhale 3 mLs into the lungs See admin instructions. Use 1 vial (61m) via nebulizer every 8 hours, and 1 vial (387m) via nebulizer every 4 hours as needed for wheezing /shortness of breath.  5  . levothyroxine (SYNTHROID, LEVOTHROID) 100 MCG tablet Take 100 mcg by mouth daily before breakfast. Take along witg 88 mcg tablet for total dose of 188 mcg.    . levothyroxine (SYNTHROID, LEVOTHROID) 88 MCG tablet Take 88 mcg by mouth daily before breakfast. Take along with 100 mcg tablet for a total dose of 188 mcg.    . lovastatin (MEVACOR) 20 MG tablet Take 1 tablet by mouth daily.    . magnesium hydroxide (MILK OF MAGNESIA) 400 MG/5ML suspension Take 30 mLs by mouth daily as needed for mild constipation or moderate constipation.    . Marland Kitchenorphine (ROXANOL) 20 MG/ML concentrated solution Take 5-10 mg by mouth See admin instructions. Take 65m57m0.265m32mvery 4 hours. May take 5-10mg28m265ml-41ml) every hour as needed for pain or dyspnea    . ondansetron (ZOFRAN) 8 MG tablet Take 1 tablet (8 mg total) by mouth every 8 (eight) hours as needed for nausea or vomiting. 21 tablet 2  . oxyCODONE-acetaminophen (PERCOCET/ROXICET) 5-325 MG per tablet  Take 1 tablet by mouth every 6 (six) hours as needed (chest pain). 30 tablet 0  . sennosides-docusate sodium (SENOKOT-S) 8.6-50 MG tablet Take 2 tablets by mouth 2 (two) times daily.    . tamoxifen (NOLVADEX) 10 MG tablet Take 20 mg by mouth daily.    . tiotMarland Kitchenopium (SPIRIVA) 18 MCG inhalation capsule Place 18 mcg into inhaler and inhale daily.     No current facility-administered medications for this visit.    Review of Systems Review of Systems  Constitutional: Negative.   Respiratory: Positive for cough and shortness of breath.  Cardiovascular: Negative.     Blood pressure 138/74, pulse 81, resp. rate 16, height 5' (1.524 m), weight 126 lb (57.153 kg).  The patient's weight is down 24 pounds in the past year.  Physical Exam Physical Exam  Constitutional: She is oriented to person, place, and time. She appears well-developed and well-nourished.  Eyes: Conjunctivae are normal. No scleral icterus.  Neck: Neck supple. No thyromegaly present.  Cardiovascular: Normal rate, regular rhythm and normal heart sounds.   Pulmonary/Chest: Effort normal and breath sounds normal. Right breast exhibits no inverted nipple, no mass, no nipple discharge, no skin change and no tenderness. Left breast exhibits no inverted nipple, no mass, no nipple discharge, no skin change and no tenderness.    The patient reports that she was not aware of this area when bathing.  Lymphadenopathy:    She has no cervical adenopathy.  Neurological: She is alert and oriented to person, place, and time.  Skin: Skin is warm and dry.    Data Reviewed Bilateral mammogram dated 03/12/2015 was reviewed. The tumor mass in the left axilla was not visualized. BI-RADS-1. These films were independently reviewed.  November 2016 medical oncology notes reviewed.   Assessment    Left axillary mass, suspicious for malignancy.    Plan    The patient was amenable to biopsy. 10 mL of 0.5% Xylocaine with 0.25% Marcaine with  1-200,000 units of epinephrine was utilized well tolerated. ChloraPrep was applied to the skin. With ultrasound guidance 14-gauge Bard biopsy device was utilized and multiple core samples were obtained. The procedure was well tolerated. Benzoin and Steri-Strip applied followed by Telfa and Tegaderm dressing.  The patient will be contacted when pathology is available.  Written instructions were sent home with the patient to make use of ice to the area intermittently for the next 24-48 hours.     This information has been scribed by Verlene Mayer, CMA   PCP: Dr. Nelle Don, [place] 03/25/2015, 5:18 PM

## 2015-03-25 DIAGNOSIS — R2230 Localized swelling, mass and lump, unspecified upper limb: Secondary | ICD-10-CM | POA: Insufficient documentation

## 2015-03-27 ENCOUNTER — Telehealth: Payer: Self-pay | Admitting: *Deleted

## 2015-03-27 NOTE — Telephone Encounter (Signed)
Message left at [facility] for patient to call the office.   Per Dr. Bary Castilla, pathology report came back and it was just an old scar.

## 2015-03-28 LAB — PATHOLOGY

## 2015-03-28 NOTE — Telephone Encounter (Signed)
With benign biopsy results in the patient being unaware of the lesion, we'll plan for reassessment in 6 weeks.

## 2015-03-31 ENCOUNTER — Inpatient Hospital Stay: Payer: Medicare Other | Attending: Oncology | Admitting: Oncology

## 2015-03-31 VITALS — BP 167/67 | HR 82 | Temp 99.1°F | Resp 18 | Wt 124.1 lb

## 2015-03-31 DIAGNOSIS — Z7984 Long term (current) use of oral hypoglycemic drugs: Secondary | ICD-10-CM | POA: Diagnosis not present

## 2015-03-31 DIAGNOSIS — Z87891 Personal history of nicotine dependence: Secondary | ICD-10-CM | POA: Insufficient documentation

## 2015-03-31 DIAGNOSIS — C50412 Malignant neoplasm of upper-outer quadrant of left female breast: Secondary | ICD-10-CM | POA: Insufficient documentation

## 2015-03-31 DIAGNOSIS — C3432 Malignant neoplasm of lower lobe, left bronchus or lung: Secondary | ICD-10-CM | POA: Diagnosis not present

## 2015-03-31 DIAGNOSIS — Z79810 Long term (current) use of selective estrogen receptor modulators (SERMs): Secondary | ICD-10-CM | POA: Insufficient documentation

## 2015-03-31 DIAGNOSIS — C50912 Malignant neoplasm of unspecified site of left female breast: Secondary | ICD-10-CM

## 2015-03-31 DIAGNOSIS — Z923 Personal history of irradiation: Secondary | ICD-10-CM

## 2015-03-31 DIAGNOSIS — E119 Type 2 diabetes mellitus without complications: Secondary | ICD-10-CM | POA: Diagnosis not present

## 2015-03-31 DIAGNOSIS — N189 Chronic kidney disease, unspecified: Secondary | ICD-10-CM

## 2015-03-31 DIAGNOSIS — I129 Hypertensive chronic kidney disease with stage 1 through stage 4 chronic kidney disease, or unspecified chronic kidney disease: Secondary | ICD-10-CM | POA: Diagnosis not present

## 2015-03-31 DIAGNOSIS — R0602 Shortness of breath: Secondary | ICD-10-CM | POA: Diagnosis not present

## 2015-03-31 DIAGNOSIS — M810 Age-related osteoporosis without current pathological fracture: Secondary | ICD-10-CM | POA: Diagnosis not present

## 2015-03-31 DIAGNOSIS — Z170 Estrogen receptor positive status [ER+]: Secondary | ICD-10-CM

## 2015-03-31 DIAGNOSIS — R110 Nausea: Secondary | ICD-10-CM

## 2015-03-31 DIAGNOSIS — Z803 Family history of malignant neoplasm of breast: Secondary | ICD-10-CM | POA: Diagnosis not present

## 2015-03-31 DIAGNOSIS — Z8619 Personal history of other infectious and parasitic diseases: Secondary | ICD-10-CM | POA: Insufficient documentation

## 2015-03-31 DIAGNOSIS — M129 Arthropathy, unspecified: Secondary | ICD-10-CM | POA: Insufficient documentation

## 2015-03-31 DIAGNOSIS — Z7982 Long term (current) use of aspirin: Secondary | ICD-10-CM | POA: Diagnosis not present

## 2015-03-31 DIAGNOSIS — Z79899 Other long term (current) drug therapy: Secondary | ICD-10-CM | POA: Diagnosis not present

## 2015-03-31 DIAGNOSIS — R634 Abnormal weight loss: Secondary | ICD-10-CM | POA: Insufficient documentation

## 2015-03-31 DIAGNOSIS — C3492 Malignant neoplasm of unspecified part of left bronchus or lung: Secondary | ICD-10-CM

## 2015-03-31 DIAGNOSIS — E039 Hypothyroidism, unspecified: Secondary | ICD-10-CM

## 2015-03-31 NOTE — Progress Notes (Signed)
Patient has a decrease in appetite and sometimes feels nauseas after eating.

## 2015-03-31 NOTE — Telephone Encounter (Signed)
Patient notified of biopsy results and verbalizes understanding.   An appointment for 6 week follow up has been arranged accordingly.

## 2015-03-31 NOTE — Progress Notes (Signed)
[place]  [phone] [phone]) [phone]     ID: Whitney Wright OB: October 13, 1940  MR#: [id number]  XJO#:[id number]  Patient Care Team: Tracie Harrier, MD as PCP - General (Internal Medicine) Robert Bellow, MD (General Surgery) Tracie Harrier, MD as Physician Assistant (Internal Medicine)  CHIEF COMPLAINT/DIAGNOSIS:  1. History of Breast Cancer Stage Ia, ER+/PR-, Her2neu neg. Stage I (T1aNxM0) invasive mammary carcinoma status post wide local excision in 2013 followed by Radiation therapy. On tamoxifen.   2. Recurrent Non-small cell lung cancer. 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 Completed radiation therapy (SBRT) in March 2015.  01/15/14 - PET scan. Progressive enlargement of previously described left lower lobe nodule which demonstrates low level hypermetabolic activity, suspicious for neoplasm such as a primary bronchogenic adenocarcinoma.  01/30/14 - Pathology Report. LUNG, LEFT LOWER LOBE; CT-GUIDED CORE BIOPSY: MUCINOUS ADENOCARCINOMA WITH LEPIDIC PATTERN. EGFR, ALK, ROS-1, RET all negative. Completed course of radiation early January 2016.   HISTORY OF PRESENT ILLNESS:   Patient returns to clinic today for routine evaluation. She continues to tolerate tamoxifen without significant side effects. She currently feels well and is at her baseline. She continues to have persistent dyspnea on exertion. She denies any fevers. She does complain of nausea and weight loss. She denies any pain. She has no neurologic complaints. She denies any chest pain, cough, or hemoptysis. She denies any nausea, vomiting, constipation, or diarrhea. She has no urinary complaints. Patient otherwise feels well and offers no further specific complaints.  REVIEW OF SYSTEMS:   Review of Systems  Constitutional: Positive for malaise/fatigue. Negative for fever and weight loss.  Respiratory: Positive for shortness of breath. Negative for  cough and hemoptysis.   Cardiovascular: Negative.  Negative for chest pain.  Gastrointestinal: Positive for nausea.  Musculoskeletal: Negative.   Neurological: Positive for weakness.    PAST MEDICAL HISTORY: Reviewed. Past Medical History  Diagnosis Date  . Hypertension   . Cellulitis and abscess of trunk   . Thyroid disease     hypo  . Arthritis   . Personal history of malignant neoplasm of breast   . Diabetes mellitus without complication ([place])   . Senile osteoporosis   . COPD (chronic obstructive pulmonary disease) ([place])   . Shortness of breath dyspnea   . Chronic kidney disease   . 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 ER 50%, PR 0, HER-2/neu nonamplified.. Tamoxifen chosen as bone density showed severe osteoporosis.  . Lung cancer[facility] upper lobe ([facility] February 2015    Adenocarcinoma with lepidic pattern, second primary fall 2015  . [place]    left breast with rad tx.     PAST SURGICAL HISTORY: Reviewed. Past Surgical History  Procedure Laterality Date  . Abdominal hysterectomy  age 75 . Mammosite balloon placement Left 2012  . Cardiac catheterization  2014  . Breast surgery Left January 11, 2011    left breast stereotactic biopsy: DCIS  . Appendectomy    . Flexible sigmoidoscopy N/A 09/20/2014    Procedure: FLEXIBLE SIGMOIDOSCOPY;  Surgeon: RManya Silvas MD;  [facility];  Service: Endoscopy;  Laterality: N/A;  . Breast biopsy Left 2010    benign  . Breast biopsy Left 2012    positive  . Breast cyst aspiration Right 2008    FAMILY HISTORY: Reviewed. Family History  Problem Relation  Age of Onset  . Breast cancer Daughter 27  . Breast cancer Mother 38  . Diabetes Mother   . Hypertension Mother   . Hypertension Sister     SOCIAL HISTORY: Reviewed. Social History  Substance Use Topics  . Smoking  status: Former Smoker -- 0.00 packs/day for 30 years  . Smokeless tobacco: Never Used  . Alcohol Use: No    Allergies  Allergen Reactions  . Hydrocodone-Acetaminophen Rash  . Macrodantin [Nitrofurantoin] Rash    Current Outpatient Prescriptions  Medication Sig Dispense Refill  . acetaminophen (TYLENOL) 325 MG tablet Take 650 mg by mouth every 4 (four) hours as needed for mild pain, moderate pain or fever.    Marland Kitchen albuterol (PROVENTIL HFA;VENTOLIN HFA) 108 (90 BASE) MCG/ACT inhaler Inhale 2 puffs into the lungs every 6 (six) hours as needed for wheezing or shortness of breath.     . Alum & Mag Hydroxide-Simeth (MAG-AL PLUS XS PO) Take 30 mLs by mouth every 4 (four) hours as needed (for gas/ingestion.).    Marland Kitchen bisacodyl (DULCOLAX) 10 MG suppository Place 10 mg rectally daily as needed for moderate constipation.    . Cholecalciferol 1000 UNITS TBDP Take 1,000 Units by mouth daily. Reported on 03/31/2015    . ferrous sulfate 325 (65 FE) MG tablet Take 325 mg by mouth 2 (two) times daily with a meal.    . furosemide (LASIX) 40 MG tablet Take 40 mg by mouth 2 (two) times daily as needed.    . hydrALAZINE (APRESOLINE) 50 MG tablet Take 1 tablet (50 mg total) by mouth 2 (two) times daily. 60 tablet 3  . hydrocortisone cream 1 % Apply 1 application topically 4 (four) times daily as needed (for dry hemorrhoids.).    Marland Kitchen ipratropium-albuterol (DUONEB) 0.5-2.5 (3) MG/3ML SOLN Inhale 3 mLs into the lungs See admin instructions. Use 1 vial (59m) via nebulizer every 8 hours, and 1 vial (325m) via nebulizer every 4 hours as needed for wheezing /shortness of breath.  5  . levothyroxine (SYNTHROID, LEVOTHROID) 100 MCG tablet Take 100 mcg by mouth daily before breakfast. Reported on 03/31/2015    . levothyroxine (SYNTHROID, LEVOTHROID) 88 MCG tablet Take 88 mcg by mouth daily before breakfast. Take along with 100 mcg tablet for a total dose of 188 mcg.    . lovastatin (MEVACOR) 20 MG tablet Take 1 tablet by mouth  daily.    . magnesium hydroxide (MILK OF MAGNESIA) 400 MG/5ML suspension Take 30 mLs by mouth daily as needed for mild constipation or moderate constipation.    . Marland Kitchenmeprazole (PRILOSEC) 20 MG capsule Take 20 mg by mouth daily.    . ondansetron (ZOFRAN) 8 MG tablet Take 1 tablet (8 mg total) by mouth every 8 (eight) hours as needed for nausea or vomiting. 21 tablet 2  . oxyCODONE-acetaminophen (PERCOCET/ROXICET) 5-325 MG per tablet Take 1 tablet by mouth every 6 (six) hours as needed (chest pain). 30 tablet 0  . pramipexole (MIRAPEX) 0.125 MG tablet Take 0.125 mg by mouth at bedtime as needed.    . sennosides-docusate sodium (SENOKOT-S) 8.6-50 MG tablet Take 2 tablets by mouth 2 (two) times daily.    . tamoxifen (NOLVADEX) 10 MG tablet Take 20 mg by mouth daily.    . Marland Kitcheniotropium (SPIRIVA) 18 MCG inhalation capsule Place 18 mcg into inhaler and inhale daily.    . traMADol (ULTRAM) 50 MG tablet Take 50 mg by mouth 2 (two) times daily.    . traZODone (DESYREL) 50 MG tablet Take  50 mg by mouth at bedtime.    Marland Kitchen ADVAIR DISKUS 250-50 MCG/DOSE AEPB Inhale 1 puff into the lungs 2 (two) times daily. Reported on 03/31/2015  5  . ALPRAZolam (XANAX) 0.25 MG tablet Take 0.25 mg by mouth at bedtime as needed for anxiety. Reported on 03/31/2015  3  . Amino Acids-Protein Hydrolys (FEEDING SUPPLEMENT, PRO-STAT SUGAR FREE 64,) LIQD Take 30 mLs by mouth 3 (three) times daily with meals. Reported on 03/31/2015    . aspirin EC 81 MG tablet Take 81 mg by mouth daily. Reported on 03/31/2015    . calcium carbonate (OS-CAL) 600 MG TABS tablet Take 600 mg by mouth 2 (two) times daily with a meal. Reported on 03/31/2015    . chlorpheniramine-HYDROcodone (TUSSIONEX PENNKINETIC ER) 10-8 MG/5ML SUER Take 5 mLs by mouth every 12 (twelve) hours as needed for cough. (Patient not taking: Reported on 03/31/2015) 140 mL 0  . GLUCERNA (GLUCERNA) LIQD Take 1 Can by mouth 2 (two) times daily between meals. Reported on 03/31/2015    . guaiFENesin  (MUCINEX) 600 MG 12 hr tablet Take 600 mg by mouth 2 (two) times daily. Reported on 03/31/2015    . insulin aspart (NOVOLOG) 100 UNIT/ML injection Inject 4 Units into the skin 3 (three) times daily before meals. (Patient not taking: Reported on 03/31/2015) 10 mL 11  . insulin glargine (LANTUS) 100 UNIT/ML injection Inject 0.2 mLs (20 Units total) into the skin at bedtime. (Patient not taking: Reported on 03/31/2015) 10 mL 11  . morphine (ROXANOL) 20 MG/ML concentrated solution Take 5-10 mg by mouth See admin instructions. Reported on 03/31/2015     No current facility-administered medications for this visit.    PHYSICAL EXAM: Filed Vitals:   03/31/15 1504  BP: 167/67  Pulse: 82  Temp: 99.1 F (37.3 C)  Resp: 18     Body mass index is 24.24 kg/(m^2).      General: Well-developed, well-nourished, no acute distress. Eyes: Pink conjunctiva, anicteric sclera. Breasts: Patient requested exam be deferred today. Lungs: Diminished breath sounds bilaterally. Heart: Regular rate and rhythm. No rubs, murmurs, or gallops. Abdomen: Soft, nontender, nondistended. No organomegaly noted, normoactive bowel sounds. Musculoskeletal: No edema, cyanosis, or clubbing. Neuro: Alert, answering all questions appropriately. Cranial nerves grossly intact. Skin: No rashes or petechiae noted. Psych: Normal affect.   LAB RESULTS:    Component Value Date/Time   NA 140 10/11/2014 0450   NA 141 12/28/2013 1115   K 3.7 10/11/2014 0450   K 3.8 12/28/2013 1115   CL 101 10/11/2014 0450   CL 104 12/28/2013 1115   CO2 30 10/11/2014 0450   CO2 26 12/28/2013 1115   GLUCOSE 119* 10/11/2014 0450   GLUCOSE 146* 12/28/2013 1115   BUN 101* 10/11/2014 0450   BUN 53* 12/28/2013 1115   CREATININE 1.81* 12/25/2014 1336   CREATININE 1.45* 06/26/2014 1401   CALCIUM 8.2* 10/11/2014 0450   CALCIUM 8.1* 12/28/2013 1115   PROT 6.3* 12/25/2014 1336   PROT 6.1* 06/26/2014 1401   ALBUMIN 2.6* 12/25/2014 1336   ALBUMIN 2.7*  06/26/2014 1401   AST 22 12/25/2014 1336   AST 24 06/26/2014 1401   ALT 14 12/25/2014 1336   ALT 28 06/26/2014 1401   ALKPHOS 76 12/25/2014 1336   ALKPHOS 76 06/26/2014 1401   BILITOT 0.2* 12/25/2014 1336   BILITOT 0.3 06/26/2014 1401   GFRNONAA 26* 12/25/2014 1336   GFRNONAA 35* 06/26/2014 1401   GFRNONAA 33* 03/27/2014 1541   GFRAA 30* 12/25/2014 1336  GFRAA 40* 06/26/2014 1401   GFRAA 40* 03/27/2014 1541    Lab Results  Component Value Date   WBC 12.0* 12/25/2014   NEUTROABS 10.4* 12/25/2014   HGB 8.8* 12/25/2014   HCT 28.4* 12/25/2014   MCV 76.5* 12/25/2014   PLT 268 12/25/2014    STUDIES:  1. Interval enlargement of what previously appeared to be a scar the post lumpectomy site in the lateral aspect of the left breast, which now appears potentially invading the left chest wall (difficult to assess on today's noncontrast CT examination). The possibility of local recurrence of disease is not excluded, and correlation with breast MRI is suggested.  2. Increasingly conspicuous 3.6 x 2.7 cm mass-like opacity in the posterior aspect of the left upper lobe. This is in the same location is the originally diagnosed left upper lobe neoplasm, and is immediately deep to the left breast lumpectomy site, presumably targeted by radiation therapy at least once, and accordingly, may simply represent an evolving area of postradiation mass-like fibrosis. However, the possibility of local recurrence is not excluded, and close attention on followup studies or further evaluation at this time with PET-CT is recommended. 3. Slight interval increase in moderate left pleural effusion. 4. Several new pulmonary nodules in the right lung, favored to be infectious or inflammatory in etiology. Attention on follow up studies is recommended to ensure their stability or resolution.   ASSESSMENT / PLAN:    1. Non-small cell lung cancer:  Most recent CT from January 08, 2015 reviewed independently and  reported as above reveals possible slight progression of disease. Given her declining performance status, patient is not a candidate for systemic chemotherapy. Previously, per radiation oncology there are no additional plans for XRT. Patient expressed understanding her overall prognosis is poor if she has recurrent lung cancer along with multiple other co-morbidities. Will not pursue any further imaging unless patient becomes symptomatic. 2. Breast cancer: No evidence of disease. Continue tamoxifen completing in approximately January 2018. Her most recent mammogram in January 2017 was reported as BI-RADS 1. Repeat in one year. Patient had a biopsy of a left axilla mass March 24, 2015 with no evidence of malignancy. Return to clinic in 4 months for evaluation.  3. Nausea/weight loss: Recommend taking Ondansetron prior to meals to increase oral intake.    Mayra Reel, NP   03/31/2015 3:29 PM  Patient was seen and evaluated independently and I agree with the assessment and plan as dictated above.  Lloyd Huger, MD 04/02/2015 5:24 AM

## 2015-05-03 ENCOUNTER — Other Ambulatory Visit
Admission: RE | Admit: 2015-05-03 | Discharge: 2015-05-03 | Disposition: A | Discharge status: Home / Self Care | Payer: Medicare Other | Source: Ambulatory Visit | Attending: Family Medicine | Admitting: Family Medicine

## 2015-05-03 DIAGNOSIS — N390 Urinary tract infection, site not specified: Secondary | ICD-10-CM | POA: Insufficient documentation

## 2015-05-03 LAB — URINALYSIS COMPLETE WITH MICROSCOPIC (ARMC ONLY)
BILIRUBIN URINE: NEGATIVE
Bacteria, UA: NONE SEEN
Glucose, UA: 50 mg/dL — AB
HGB URINE DIPSTICK: NEGATIVE
KETONES UR: NEGATIVE mg/dL
LEUKOCYTES UA: NEGATIVE
Nitrite: NEGATIVE
PH: 5 (ref 5.0–8.0)
PROTEIN: 100 mg/dL — AB
Specific Gravity, Urine: 1.015 (ref 1.005–1.030)

## 2015-05-05 LAB — URINE CULTURE

## 2015-05-13 ENCOUNTER — Ambulatory Visit: Payer: Commercial Managed Care - HMO | Admitting: General Surgery

## 2015-05-20 ENCOUNTER — Encounter: Payer: Self-pay | Admitting: General Surgery

## 2015-05-20 ENCOUNTER — Ambulatory Visit (INDEPENDENT_AMBULATORY_CARE_PROVIDER_SITE_OTHER): Payer: Medicaid Other | Admitting: General Surgery

## 2015-05-20 VITALS — BP 130/68 | HR 72 | Resp 18 | Ht 62.0 in | Wt 116.0 lb

## 2015-05-20 DIAGNOSIS — R2232 Localized swelling, mass and lump, left upper limb: Secondary | ICD-10-CM | POA: Diagnosis not present

## 2015-05-20 DIAGNOSIS — C50912 Malignant neoplasm of unspecified site of left female breast: Secondary | ICD-10-CM | POA: Diagnosis not present

## 2015-05-20 NOTE — Progress Notes (Signed)
Patient ID: Whitney Wright, female   DOB: 09/15/1940, 75 y.o.   MRN: [id number]  Chief Complaint  Patient presents with  . Mass    left axilla    HPI Whitney Wright is a 75 y.o. female.  Here today for follow up left axillary mass. She states she has had some sharp pain  in her left ribs when she coughs a couple of times a day.  I personally reviewed the patient's history  HPI  Past Medical History  Diagnosis Date  . Hypertension   . Cellulitis and abscess of trunk   . Thyroid disease     hypo  . Arthritis   . Personal history of malignant neoplasm of breast   . Diabetes mellitus without complication ([place])   . Senile osteoporosis   . COPD (chronic obstructive pulmonary disease) ([place])   . Shortness of breath dyspnea   . Chronic kidney disease   . 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 ER 50%, PR 0, HER-2/neu nonamplified.. Tamoxifen chosen as bone density showed severe osteoporosis.  . Lung cancer, upper lobe ([facility] February 2015    Adenocarcinoma with lepidic pattern, second primary fall 2015  . Breast cancer ([facility] 2012    left breast with rad tx.     Past Surgical History  Procedure Laterality Date  . Abdominal hysterectomy  age 75 . Mammosite balloon placement Left 2012  . Cardiac catheterization  2014  . Breast surgery Left January 11, 2011    left breast stereotactic biopsy: DCIS  . Appendectomy    . Flexible sigmoidoscopy N/A 09/20/2014    Procedure: FLEXIBLE SIGMOIDOSCOPY;  Surgeon: RManya Silvas MD;  [facility];  Service: Endoscopy;  Laterality: N/A;  . Breast biopsy Left 2010    benign  . Breast biopsy Left 2012    positive  . Breast cyst aspiration Right 2008    Family History  Problem Relation Age of Onset  . Breast cancer Daughter 456 . Breast cancer Mother 739 . Diabetes Mother   . Hypertension  Mother   . Hypertension Sister     Social History Social History  Substance Use Topics  . Smoking status: Former Smoker -- 0.00 packs/day for 30 years  . Smokeless tobacco: Never Used  . Alcohol Use: No    Allergies  Allergen Reactions  . Hydrocodone-Acetaminophen Rash  . Macrodantin [Nitrofurantoin] Rash    Current Outpatient Prescriptions  Medication Sig Dispense Refill  . acetaminophen (TYLENOL) 325 MG tablet Take 650 mg by mouth every 4 (four) hours as needed for mild pain, moderate pain or fever.    .Marland KitchenADVAIR DISKUS 250-50 MCG/DOSE AEPB Inhale 1 puff into the lungs 2 (two) times daily. Reported on 03/31/2015  5  . albuterol (PROVENTIL HFA;VENTOLIN HFA) 108 (90 BASE) MCG/ACT inhaler Inhale 2 puffs into the lungs every 6 (six) hours as needed for wheezing or shortness of breath.     . ALPRAZolam (XANAX) 0.25 MG tablet Take 0.25 mg by mouth at bedtime as needed for anxiety. Reported on 03/31/2015  3  . Alum & Mag Hydroxide-Simeth (MAG-AL PLUS XS PO) Take 30 mLs by mouth every 4 (four) hours as needed (for gas/ingestion.).    .Marland KitchenAmino Acids-Protein Hydrolys (FEEDING SUPPLEMENT, PRO-STAT SUGAR FREE 64,) LIQD Take 30 mLs by mouth 3 (three) times daily with meals. Reported on 03/31/2015    .  aspirin EC 81 MG tablet Take 81 mg by mouth daily. Reported on 03/31/2015    . bisacodyl (DULCOLAX) 10 MG suppository Place 10 mg rectally daily as needed for moderate constipation.    . calcium carbonate (OS-CAL) 600 MG TABS tablet Take 600 mg by mouth 2 (two) times daily with a meal. Reported on 03/31/2015    . chlorpheniramine-HYDROcodone (TUSSIONEX PENNKINETIC ER) 10-8 MG/5ML SUER Take 5 mLs by mouth every 12 (twelve) hours as needed for cough. (Patient not taking: Reported on 03/31/2015) 140 mL 0  . Cholecalciferol 1000 UNITS TBDP Take 1,000 Units by mouth daily. Reported on 03/31/2015    . ferrous sulfate 325 (65 FE) MG tablet Take 325 mg by mouth 2 (two) times daily with a meal.    . furosemide  (LASIX) 40 MG tablet Take 40 mg by mouth 2 (two) times daily as needed.    Marland Kitchen GLUCERNA (GLUCERNA) LIQD Take 1 Can by mouth 2 (two) times daily between meals. Reported on 03/31/2015    . guaiFENesin (MUCINEX) 600 MG 12 hr tablet Take 600 mg by mouth 2 (two) times daily. Reported on 03/31/2015    . hydrALAZINE (APRESOLINE) 50 MG tablet Take 1 tablet (50 mg total) by mouth 2 (two) times daily. 60 tablet 3  . hydrocortisone cream 1 % Apply 1 application topically 4 (four) times daily as needed (for dry hemorrhoids.).    Marland Kitchen insulin aspart (NOVOLOG) 100 UNIT/ML injection Inject 4 Units into the skin 3 (three) times daily before meals. (Patient not taking: Reported on 03/31/2015) 10 mL 11  . insulin glargine (LANTUS) 100 UNIT/ML injection Inject 0.2 mLs (20 Units total) into the skin at bedtime. (Patient not taking: Reported on 03/31/2015) 10 mL 11  . ipratropium-albuterol (DUONEB) 0.5-2.5 (3) MG/3ML SOLN Inhale 3 mLs into the lungs See admin instructions. Use 1 vial (40m) via nebulizer every 8 hours, and 1 vial (331m) via nebulizer every 4 hours as needed for wheezing /shortness of breath.  5  . levothyroxine (SYNTHROID, LEVOTHROID) 100 MCG tablet Take 100 mcg by mouth daily before breakfast. Reported on 03/31/2015    . levothyroxine (SYNTHROID, LEVOTHROID) 88 MCG tablet Take 88 mcg by mouth daily before breakfast. Take along with 100 mcg tablet for a total dose of 188 mcg.    . lovastatin (MEVACOR) 20 MG tablet Take 1 tablet by mouth daily.    . magnesium hydroxide (MILK OF MAGNESIA) 400 MG/5ML suspension Take 30 mLs by mouth daily as needed for mild constipation or moderate constipation.    . Marland Kitchenorphine (ROXANOL) 20 MG/ML concentrated solution Take 5-10 mg by mouth See admin instructions. Reported on 03/31/2015    . omeprazole (PRILOSEC) 20 MG capsule Take 20 mg by mouth daily.    . ondansetron (ZOFRAN) 8 MG tablet Take 1 tablet (8 mg total) by mouth every 8 (eight) hours as needed for nausea or vomiting. 21 tablet  2  . oxyCODONE-acetaminophen (PERCOCET/ROXICET) 5-325 MG per tablet Take 1 tablet by mouth every 6 (six) hours as needed (chest pain). 30 tablet 0  . pramipexole (MIRAPEX) 0.125 MG tablet Take 0.125 mg by mouth at bedtime as needed.    . sennosides-docusate sodium (SENOKOT-S) 8.6-50 MG tablet Take 2 tablets by mouth 2 (two) times daily.    . tamoxifen (NOLVADEX) 10 MG tablet Take 20 mg by mouth daily.    . Marland Kitcheniotropium (SPIRIVA) 18 MCG inhalation capsule Place 18 mcg into inhaler and inhale daily.    . traMADol (ULTRAM) 50 MG tablet  Take 50 mg by mouth 2 (two) times daily.    . traZODone (DESYREL) 50 MG tablet Take 50 mg by mouth at bedtime.     No current facility-administered medications for this visit.    Review of Systems Review of Systems  Constitutional: Negative.   Respiratory: Negative.   Cardiovascular: Negative.     Blood pressure 130/68, pulse 72, resp. rate 18, height 5' 2"  (1.575 m), weight 116 lb (52.617 kg).  Physical Exam Physical Exam  Constitutional: She is oriented to person, place, and time. She appears well-developed and well-nourished.  Cardiovascular: Normal rate, regular rhythm and normal heart sounds.   Pulmonary/Chest: Effort normal and breath sounds normal.    Lymphadenopathy:  Lefty axilla site is clean and healing well.    Neurological: She is alert and oriented to person, place, and time.  Skin: Skin is warm and dry.    Data Reviewed Previously completed biopsy of the left axillary mass showed fat necrosis.  Assessment    Fat necrosis left axilla on asymptomatic.  Left chest wall pain, clear pulmonary exam.    Plan    The patient was encouraged to make use of local heat for comfort in the rib cage.    Patient to return in three months left axilla check up.  PCP:  Tracie Harrier This information has been scribed by Karie Fetch RNBC.   Robert Bellow 05/21/2015, 6:54 AM

## 2015-05-20 NOTE — Patient Instructions (Addendum)
The patient is aware to call back for any questions or concerns. Patient to return in three months left axilla check up.

## 2015-06-30 DEATH — deceased

## 2015-07-02 ENCOUNTER — Ambulatory Visit: Payer: Commercial Managed Care - HMO | Attending: Radiation Oncology | Admitting: Radiation Oncology

## 2015-07-30 ENCOUNTER — Ambulatory Visit: Payer: Commercial Managed Care - HMO | Admitting: Oncology

## 2015-07-31 ENCOUNTER — Ambulatory Visit: Payer: Commercial Managed Care - HMO | Admitting: Oncology

## 2015-08-19 ENCOUNTER — Ambulatory Visit: Payer: Commercial Managed Care - HMO | Admitting: General Surgery

## 2016-07-13 IMAGING — CT CT CHEST W/O CM
1 of 2 series · 14 of 31 positions shown, 18 images · non-contrast
Comparison: Chest CT 06/24/2014

CLINICAL DATA: Shortness of breath and left-sided chest pain.
History of breast and lung cancer.

EXAM:
CT CHEST WITHOUT CONTRAST
TECHNIQUE: Multidetector CT imaging of the chest was performed following the
standard protocol without IV contrast.

[Series 2: routine chest wo · axial · 0.59mm/px · z∈[-715,-470]mm · 14 of 59 slices shown, 18 images]
[im 5/59  mediastinal]
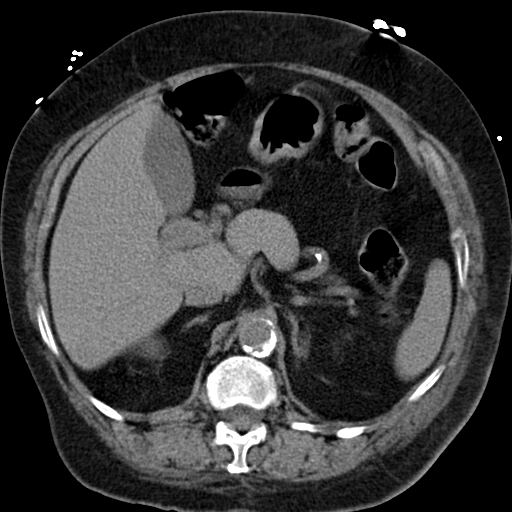
[im 5/59  lung]
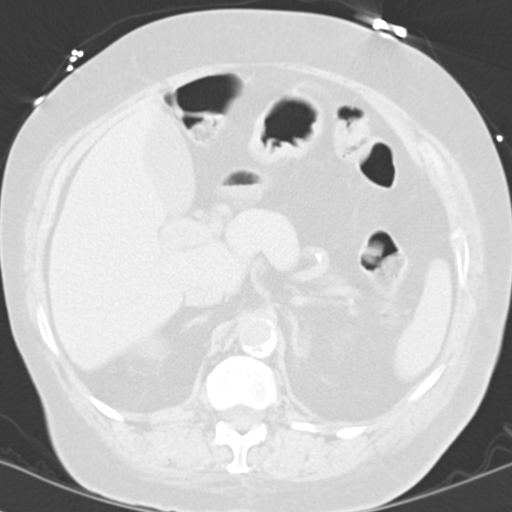
[im 9/59  lung]
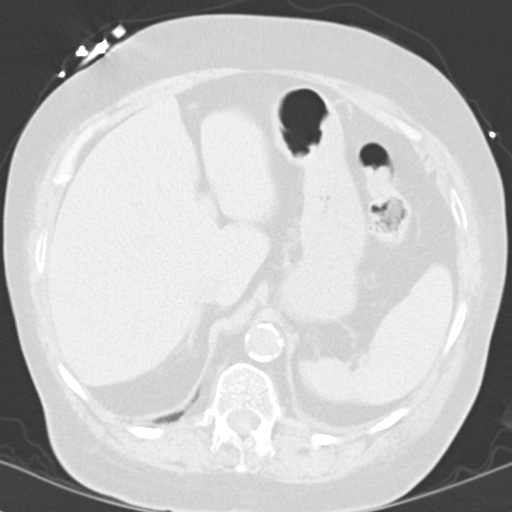
[im 14/59  lung]
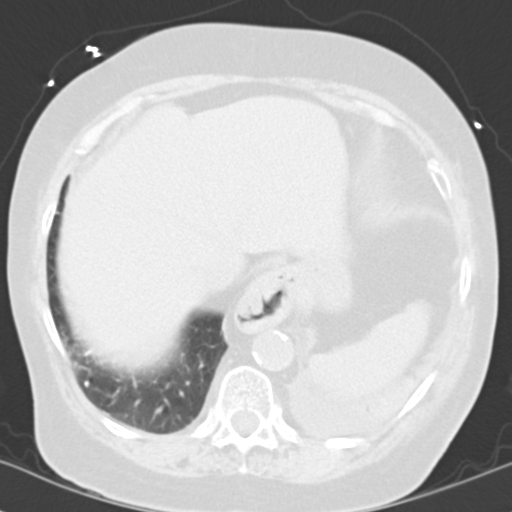
[im 18/59  lung]
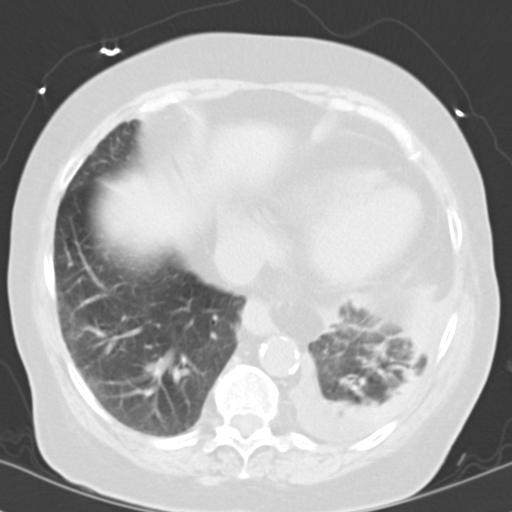
[im 23/59  mediastinal]
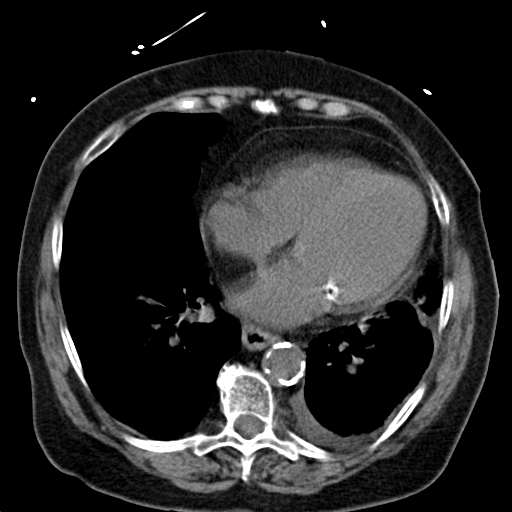
[im 23/59  lung]
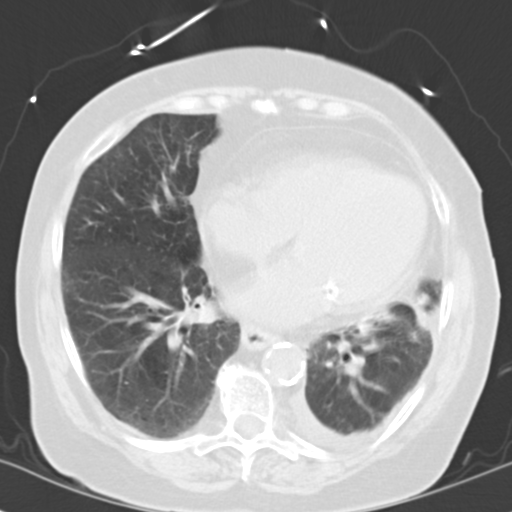
[im 27/59  lung]
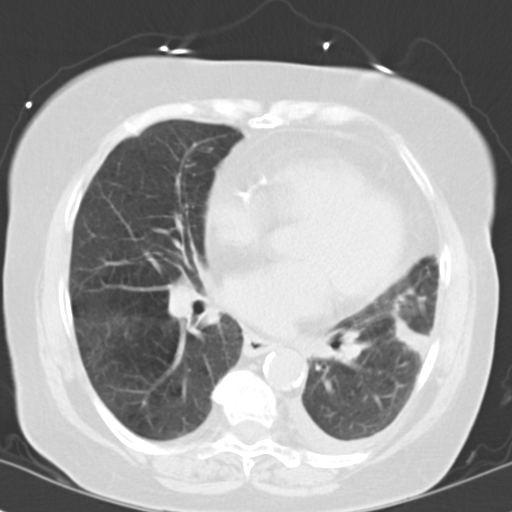
[im 29/59  lung]
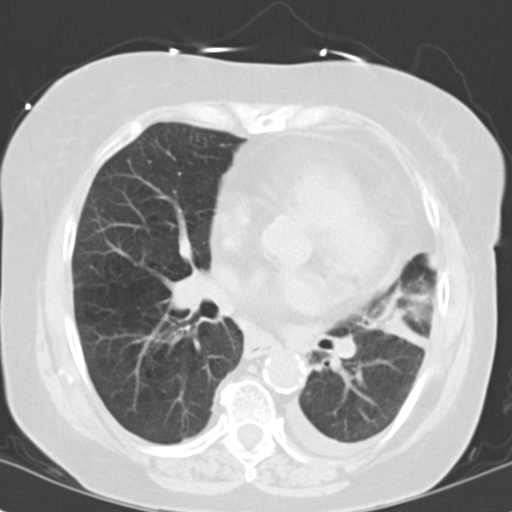
[im 30/59  lung]
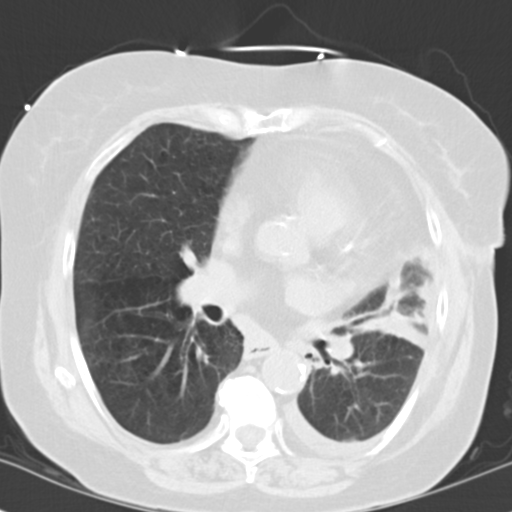
[im 32/59  mediastinal]
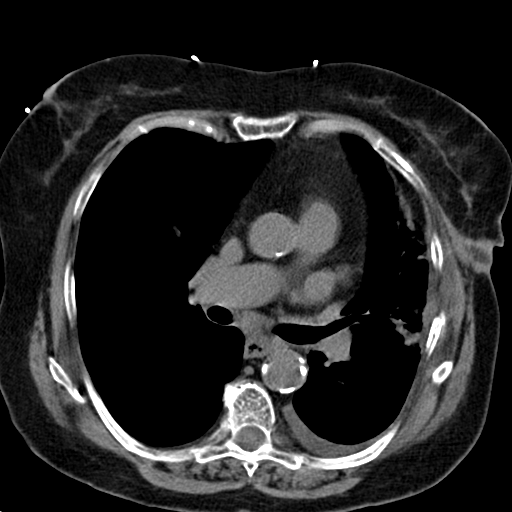
[im 32/59  lung]
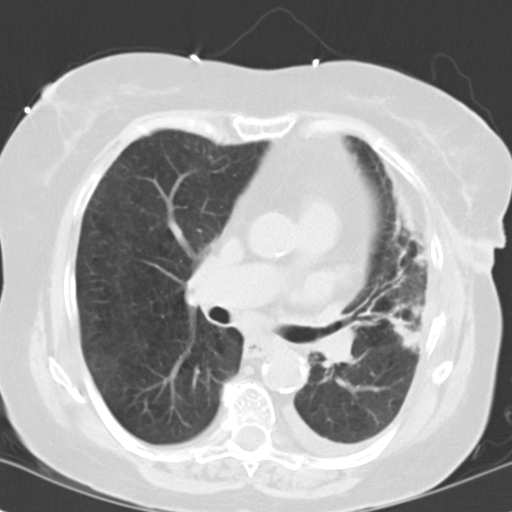
[im 36/59  lung]
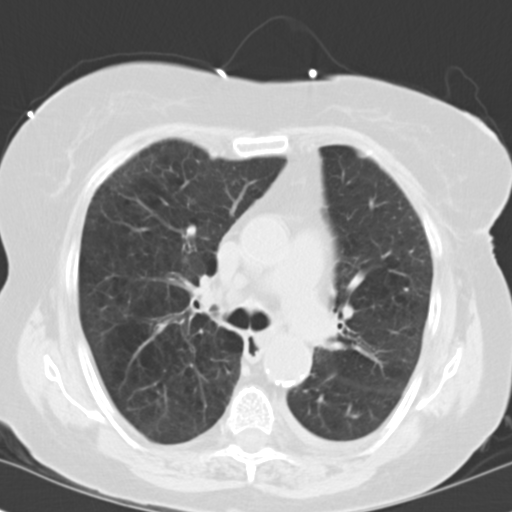
[im 41/59  lung]
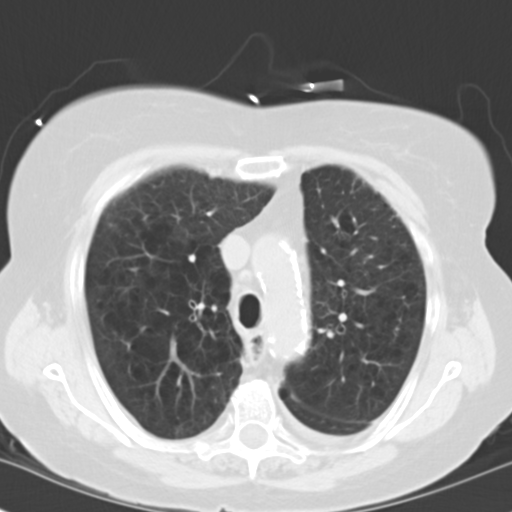
[im 45/59  lung]
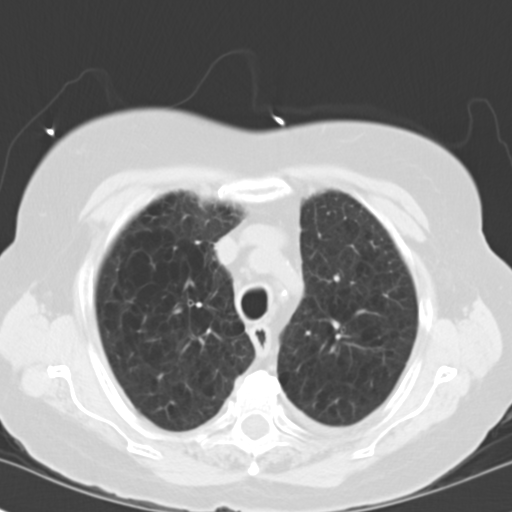
[im 50/59  mediastinal]
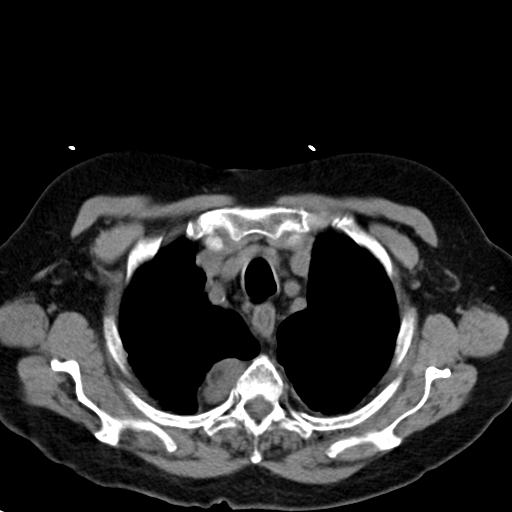
[im 50/59  lung]
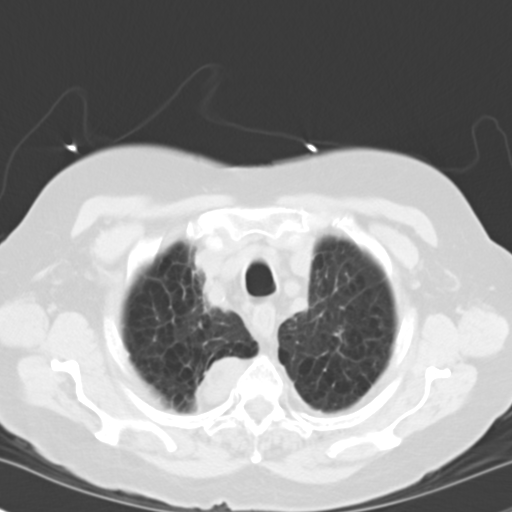
[im 54/59  lung]
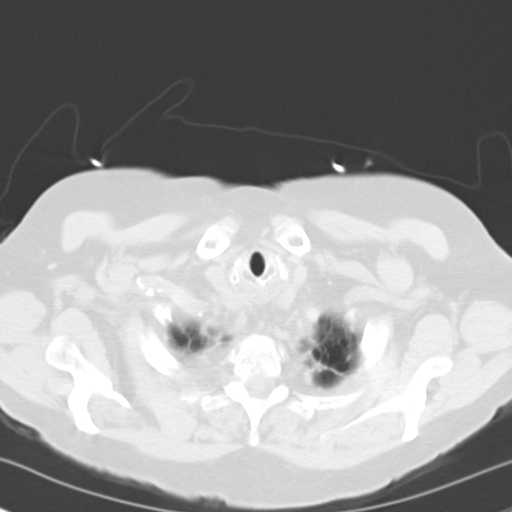

[14 of 31 positions shown; findings below may reference images not displayed]

FINDINGS: Stable pleural-based soft tissue lesion along the right posterior
hemithorax. This measures roughly 3.4 cm in greatest dimension and
unchanged. This could represent a benign neurogenic tumor.
Pretracheal lymph node on sequence 2, image 22 measures 1.2 cm in
the short axis and minimally changed. No significant chest
lymphadenopathy. No significant axillary lymphadenopathy. Again
noted is a small left pleural effusion. No significant pericardial
fluid.

Again noted is a small hiatal hernia. No acute abnormality in the
upper abdomen.

Trachea and mainstem bronchi are patent. Diffuse centrilobular
emphysema. Stable punctate calcification in the right lower lobe on
sequence 2, image 46. Stable interstitial densities along the
periphery of the right lower lobe. There is slightly increased
densities and consolidation at the left lung base compared to the
previous chest CT. The previously described left lower lung nodule
is not well demonstrated due to the consolidation in this area.
There is stable pleural-based densities in the lingula. Stable
punctate nodule in the right upper lobe on sequence 3, image 21.

No acute bone abnormality.
IMPRESSION: Slightly increased consolidation or volume loss in the left lower
lobe compared to the previous examination. Previously described
nodule in the left lower lobe is not well demonstrated on this
examination.

Chronic changes in the lingula.

Centrilobular emphysema.

Stable pleural-based lesion along the posterior right hemithorax
likely represents a benign etiology, such as a neurogenic tumor.

## 2016-08-11 IMAGING — CR DG CHEST 1V PORT
1 series · 1 of 1 positions shown · non-contrast
Comparison: CT chest dated 08/28/2014

CLINICAL DATA: Shortness of breath, productive cough

EXAM:
PORTABLE CHEST - 1 VIEW

[ap]
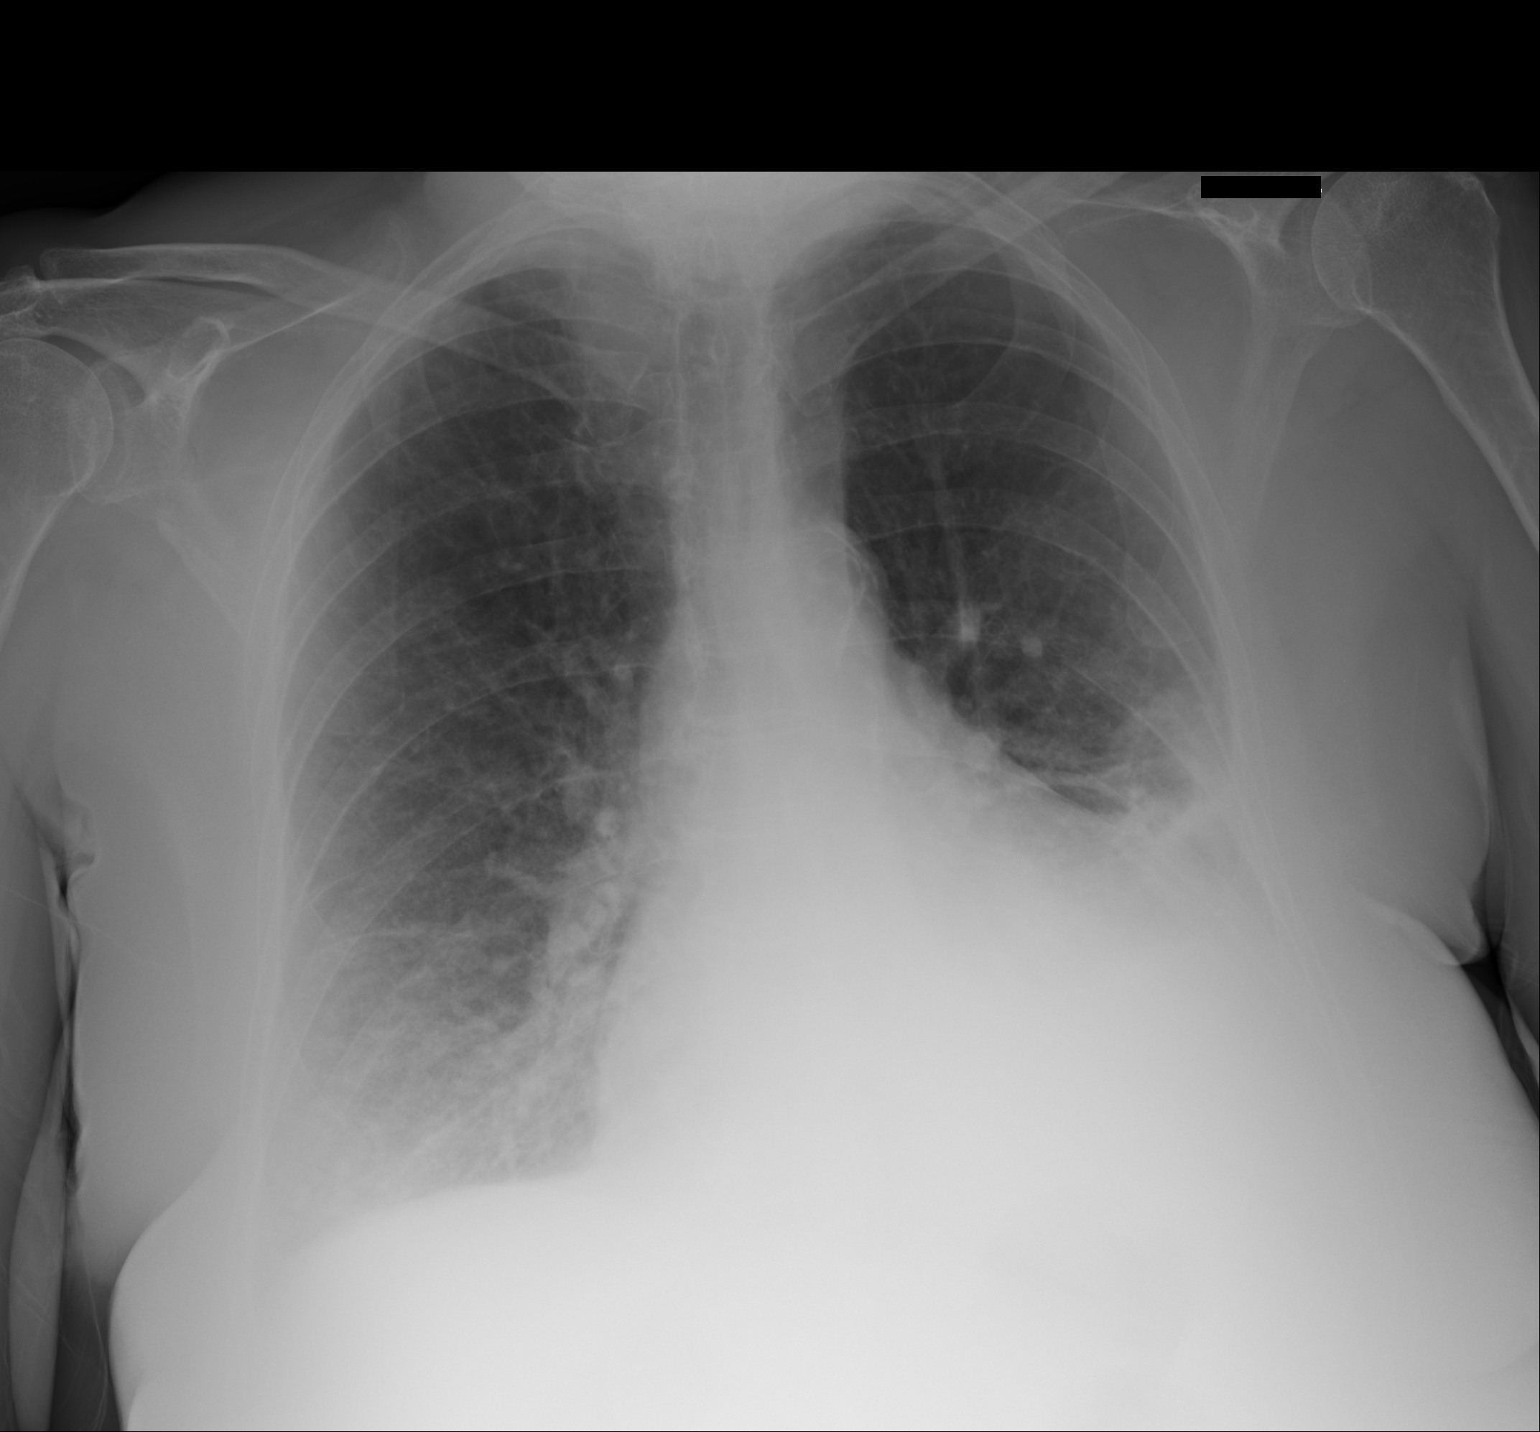

[1 of 1 positions shown; findings below may reference images not displayed]

FINDINGS: Patchy lingular/left lower lobe opacity, some of which is likely
chronic. Superimposed infection is not excluded.

Small left pleural effusion.  No pneumothorax.

Nodular opacity at the medial right lung apex corresponds to a
pleural-based mass on CT, likely reflecting a neurogenic tumor.

Cardiomegaly.
IMPRESSION: Patchy lingular/ left lower lobe opacity, some of which is likely
chronic, superimposed infection not excluded.

Small left pleural effusion.

Stable pleural-based mass at the medial right lung apex, likely
reflecting a neurogenic tumor when correlating with prior CT.
